# Patient Record
Sex: Female | Born: 1950
Health system: Southern US, Community
[De-identification: ages and names within clinical notes are randomized; demographics above are authoritative.]

## PROBLEM LIST (undated history)

## (undated) DIAGNOSIS — I10 Essential (primary) hypertension: Secondary | ICD-10-CM

## (undated) DIAGNOSIS — E7800 Pure hypercholesterolemia, unspecified: Secondary | ICD-10-CM

## (undated) HISTORY — PX: ABDOMINAL HYSTERECTOMY: SHX81

## (undated) HISTORY — DX: Essential (primary) hypertension: I10

## (undated) HISTORY — DX: Pure hypercholesterolemia, unspecified: E78.00

---

## 1998-06-27 ENCOUNTER — Other Ambulatory Visit: Admission: RE | Admit: 1998-06-27 | Discharge: 1998-06-27 | Payer: Self-pay | Admitting: Obstetrics and Gynecology

## 1999-02-23 ENCOUNTER — Other Ambulatory Visit: Admission: RE | Admit: 1999-02-23 | Discharge: 1999-02-23 | Payer: Self-pay | Admitting: Obstetrics and Gynecology

## 1999-04-05 ENCOUNTER — Encounter (INDEPENDENT_AMBULATORY_CARE_PROVIDER_SITE_OTHER): Payer: Self-pay

## 1999-04-05 ENCOUNTER — Other Ambulatory Visit: Admission: RE | Admit: 1999-04-05 | Discharge: 1999-04-05 | Payer: Self-pay | Admitting: Obstetrics and Gynecology

## 1999-06-30 ENCOUNTER — Other Ambulatory Visit: Admission: RE | Admit: 1999-06-30 | Discharge: 1999-06-30 | Payer: Self-pay | Admitting: Obstetrics and Gynecology

## 1999-11-15 ENCOUNTER — Other Ambulatory Visit: Admission: RE | Admit: 1999-11-15 | Discharge: 1999-11-15 | Payer: Self-pay | Admitting: Obstetrics and Gynecology

## 1999-11-15 ENCOUNTER — Encounter (INDEPENDENT_AMBULATORY_CARE_PROVIDER_SITE_OTHER): Payer: Self-pay | Admitting: Specialist

## 2004-01-25 ENCOUNTER — Encounter: Admission: RE | Admit: 2004-01-25 | Discharge: 2004-01-25 | Payer: Self-pay | Admitting: Neurosurgery

## 2004-06-09 ENCOUNTER — Other Ambulatory Visit: Admission: RE | Admit: 2004-06-09 | Discharge: 2004-06-09 | Payer: Self-pay | Admitting: Obstetrics and Gynecology

## 2004-07-05 ENCOUNTER — Ambulatory Visit (HOSPITAL_COMMUNITY): Admission: RE | Admit: 2004-07-05 | Discharge: 2004-07-05 | Payer: Self-pay | Admitting: Obstetrics and Gynecology

## 2004-07-12 ENCOUNTER — Encounter: Admission: RE | Admit: 2004-07-12 | Discharge: 2004-07-12 | Payer: Self-pay | Admitting: Specialist

## 2005-07-10 ENCOUNTER — Ambulatory Visit (HOSPITAL_COMMUNITY): Admission: RE | Admit: 2005-07-10 | Discharge: 2005-07-10 | Payer: Self-pay | Admitting: Obstetrics and Gynecology

## 2006-11-20 ENCOUNTER — Emergency Department (HOSPITAL_COMMUNITY): Admission: EM | Admit: 2006-11-20 | Discharge: 2006-11-20 | Payer: Self-pay | Admitting: Family Medicine

## 2007-07-15 ENCOUNTER — Ambulatory Visit (HOSPITAL_COMMUNITY): Admission: RE | Admit: 2007-07-15 | Discharge: 2007-07-15 | Payer: Self-pay | Admitting: Obstetrics and Gynecology

## 2007-08-06 ENCOUNTER — Emergency Department (HOSPITAL_COMMUNITY): Admission: EM | Admit: 2007-08-06 | Discharge: 2007-08-06 | Payer: Self-pay | Admitting: Emergency Medicine

## 2007-08-15 ENCOUNTER — Emergency Department (HOSPITAL_COMMUNITY): Admission: EM | Admit: 2007-08-15 | Discharge: 2007-08-15 | Payer: Self-pay | Admitting: Emergency Medicine

## 2008-07-16 ENCOUNTER — Ambulatory Visit (HOSPITAL_COMMUNITY): Admission: RE | Admit: 2008-07-16 | Discharge: 2008-07-16 | Payer: Self-pay | Admitting: Obstetrics and Gynecology

## 2010-03-12 ENCOUNTER — Encounter: Payer: Self-pay | Admitting: Obstetrics and Gynecology

## 2010-11-16 LAB — URINALYSIS, ROUTINE W REFLEX MICROSCOPIC
Bilirubin Urine: NEGATIVE
Glucose, UA: NEGATIVE
Hgb urine dipstick: NEGATIVE
Ketones, ur: NEGATIVE
Nitrite: NEGATIVE
Protein, ur: NEGATIVE
Specific Gravity, Urine: 1.005
Urobilinogen, UA: 0.2
pH: 6

## 2010-11-16 LAB — BASIC METABOLIC PANEL
BUN: 11
CO2: 31
Calcium: 9.6
Chloride: 102
Creatinine, Ser: 0.88
GFR calc Af Amer: >60
GFR calc non Af Amer: >60
Glucose, Bld: 117 — ABNORMAL HIGH
Potassium: 2.9 — ABNORMAL LOW
Sodium: 141

## 2010-11-16 LAB — URINE MICROSCOPIC-ADD ON

## 2012-04-02 ENCOUNTER — Ambulatory Visit: Payer: Self-pay | Admitting: Gynecology

## 2012-04-09 ENCOUNTER — Encounter: Payer: Self-pay | Admitting: Gynecology

## 2012-04-09 ENCOUNTER — Other Ambulatory Visit (HOSPITAL_COMMUNITY)
Admission: RE | Admit: 2012-04-09 | Discharge: 2012-04-09 | Disposition: A | Discharge status: Home / Self Care | Payer: PRIVATE HEALTH INSURANCE | Source: Ambulatory Visit | Attending: Gynecology | Admitting: Gynecology

## 2012-04-09 ENCOUNTER — Ambulatory Visit (INDEPENDENT_AMBULATORY_CARE_PROVIDER_SITE_OTHER): Payer: PRIVATE HEALTH INSURANCE | Admitting: Gynecology

## 2012-04-09 VITALS — BP 140/92 | Ht 63.25 in | Wt 198.0 lb

## 2012-04-09 DIAGNOSIS — Z01419 Encounter for gynecological examination (general) (routine) without abnormal findings: Secondary | ICD-10-CM

## 2012-04-09 DIAGNOSIS — Z1151 Encounter for screening for human papillomavirus (HPV): Secondary | ICD-10-CM | POA: Insufficient documentation

## 2012-04-09 DIAGNOSIS — Z780 Asymptomatic menopausal state: Secondary | ICD-10-CM

## 2012-04-09 NOTE — Patient Instructions (Addendum)
Colonoscopy A colonoscopy is an exam to evaluate your entire colon. In this exam, your colon is cleansed. A long fiberoptic tube is inserted through your rectum and into your colon. The fiberoptic scope (endoscope) is a long bundle of enclosed and very flexible fibers. These fibers transmit light to the area examined and send images from that area to your caregiver. Discomfort is usually minimal. You may be given a drug to help you sleep (sedative) during or prior to the procedure. This exam helps to detect lumps (tumors), polyps, inflammation, and areas of bleeding. Your caregiver may also take a small piece of tissue (biopsy) that will be examined under a microscope. LET YOUR CAREGIVER KNOW ABOUT:   Allergies to food or medicine.  Medicines taken, including vitamins, herbs, eyedrops, over-the-counter medicines, and creams.  Use of steroids (by mouth or creams).  Previous problems with anesthetics or numbing medicines.  History of bleeding problems or blood clots.  Previous surgery.  Other health problems, including diabetes and kidney problems.  Possibility of pregnancy, if this applies. BEFORE THE PROCEDURE   A clear liquid diet may be required for 2 days before the exam.  Ask your caregiver about changing or stopping your regular medications.  Liquid injections (enemas) or laxatives may be required.  A large amount of electrolyte solution may be given to you to drink over a short period of time. This solution is used to clean out your colon.  You should be present 60 minutes prior to your procedure or as directed by your caregiver. AFTER THE PROCEDURE   If you received a sedative or pain relieving medication, you will need to arrange for someone to drive you home.  Occasionally, there is a little blood passed with the first bowel movement. Do not be concerned. FINDING OUT THE RESULTS OF YOUR TEST Not all test results are available during your visit. If your test results are  not back during the visit, make an appointment with your caregiver to find out the results. Do not assume everything is normal if you have not heard from your caregiver or the medical facility. It is important for you to follow up on all of your test results. HOME CARE INSTRUCTIONS   It is not unusual to pass moderate amounts of gas and experience mild abdominal cramping following the procedure. This is due to air being used to inflate your colon during the exam. Walking or a warm pack on your belly (abdomen) may help.  You may resume all normal meals and activities after sedatives and medicines have worn off.  Only take over-the-counter or prescription medicines for pain, discomfort, or fever as directed by your caregiver. Do not use aspirin or blood thinners if a biopsy was taken. Consult your caregiver for medicine usage if biopsies were taken. SEEK IMMEDIATE MEDICAL CARE IF:   You have a fever.  You pass large blood clots or fill a toilet with blood following the procedure. This may also occur 10 to 14 days following the procedure. This is more likely if a biopsy was taken.  You develop abdominal pain that keeps getting worse and cannot be relieved with medicine. Document Released: 02/03/2000 Document Revised: 04/30/2011 Document Reviewed: 09/18/2007 [employer] Patient Information 2013 [employer].  

## 2012-04-09 NOTE — Addendum Note (Signed)
Addended by: Bertram Savin A on: 04/09/2012 03:30 PM   Modules accepted: Orders

## 2012-04-09 NOTE — Progress Notes (Signed)
Meghan Nichols 1950-03-16 [id number]   History:    62 y.o.  for annual gyn exam who is a new patient to the practice. She moved [place] and stated that her last gynecological examination was 3 years ago. She stated that in her early 20s she had an abdominal hysterectomy with bilateral salpingo-oophorectomy for symptomatic leiomyomatous uteri. She was on hormone replacement therapy for less than 6 months and she was afraid of breast cancer and since then has not been on any hormone replacement therapy and has no vasomotor symptoms reported. Patient did state that she's always had normal Pap smears in the past. Her last mammogram she stated was in December 2013 was normal. She also indicates that she does her monthly self breast examination. She has not had a colonoscopy as of yet. Patient declines any form of vaccination. Patient has had no prior bone density study.  Past medical history,surgical history, family history and social history were all reviewed and documented in the EPIC chart.  Gynecologic History No LMP recorded. Patient has had a hysterectomy. Contraception: status post hysterectomy Last Pap: 3 years ago. Results were: normal Last mammogram: 2013. Results were: normal  Obstetric History OB History   Grav Para Term Preterm Abortions TAB SAB Ect Mult Living   2 2        2      # Outc Date GA Lbr Len/2nd Wgt Sex Del Anes PTL Lv   1 PAR            2 PAR                ROS: A ROS was performed and pertinent positives and negatives are included in the history.  GENERAL: No fevers or chills. HEENT: No change in vision, no earache, sore throat or sinus congestion. NECK: No pain or stiffness. CARDIOVASCULAR: No chest pain or pressure. No palpitations. PULMONARY: No shortness of breath, cough or wheeze. GASTROINTESTINAL: No abdominal pain, nausea, vomiting or diarrhea, melena or bright red blood per rectum. GENITOURINARY: No urinary frequency, urgency, hesitancy or dysuria.  MUSCULOSKELETAL: No joint or muscle pain, no back pain, no recent trauma. DERMATOLOGIC: No rash, no itching, no lesions. ENDOCRINE: No polyuria, polydipsia, no heat or cold intolerance. No recent change in weight. HEMATOLOGICAL: No anemia or easy bruising or bleeding. NEUROLOGIC: No headache, seizures, numbness, tingling or weakness. PSYCHIATRIC: No depression, no loss of interest in normal activity or change in sleep pattern.     Exam: chaperone present  BP 140/92  Ht 5' 3.25" (1.607 m)  Wt 198 lb (89.812 kg)  BMI 34.78 kg/m2  Body mass index is 34.78 kg/(m^2).  General appearance : Well developed well nourished female. No acute distress HEENT: Neck supple, trachea midline, no carotid bruits, no thyroidmegaly Lungs: Clear to auscultation, no rhonchi or wheezes, or rib retractions  Heart: Regular rate and rhythm, no murmurs or gallops Breast:Examined in sitting and supine position were symmetrical in appearance, no palpable masses or tenderness,  no skin retraction, no nipple inversion, no nipple discharge, no skin discoloration, no axillary or supraclavicular lymphadenopathy Abdomen: no palpable masses or tenderness, no rebound or guarding Extremities: no edema or skin discoloration or tenderness  Pelvic:  Bartholin, Urethra, Skene Glands: Within normal limits             Vagina: No gross lesions or discharge  Cervix: absent             Uterus Absent  Adnexa  Without masses or tenderness  Anus  and perineum  normal   Rectovaginal  normal sphincter tone without palpated masses or tenderness             Hemoccult Card provided     Assessment/Plan:  62 y.o. female for annual exam with past history of surgical menopause (TAH BSO in her early 83s) asymptomatic. Patient would know prior colonoscopy. She will be given the names of our colleagues in the community for her to schedule. She was reminded to submit to the office Hemoccult cards for testing. We discussed also importance of monthly  self breast examination. She will also schedule a bone density study here in the office the next few weeks. We discussed importance of calcium and vitamin D for osteoporosis prevention.her lab work will be drawn by her primary physician Dr. Theodoro Grist.    Ok Edwards MD, 3:20 PM 04/09/2012

## 2013-12-21 ENCOUNTER — Encounter: Payer: Self-pay | Admitting: Gynecology

## 2014-06-12 ENCOUNTER — Emergency Department (HOSPITAL_COMMUNITY): Payer: BLUE CROSS/BLUE SHIELD

## 2014-06-12 ENCOUNTER — Emergency Department (HOSPITAL_COMMUNITY)
Admission: EM | Admit: 2014-06-12 | Discharge: 2014-06-12 | Disposition: A | Discharge status: Home / Self Care | Payer: BLUE CROSS/BLUE SHIELD | Attending: Emergency Medicine | Admitting: Emergency Medicine

## 2014-06-12 ENCOUNTER — Encounter (HOSPITAL_COMMUNITY): Payer: Self-pay | Admitting: Emergency Medicine

## 2014-06-12 DIAGNOSIS — E876 Hypokalemia: Secondary | ICD-10-CM

## 2014-06-12 DIAGNOSIS — Z79899 Other long term (current) drug therapy: Secondary | ICD-10-CM | POA: Insufficient documentation

## 2014-06-12 DIAGNOSIS — Z7982 Long term (current) use of aspirin: Secondary | ICD-10-CM | POA: Diagnosis not present

## 2014-06-12 DIAGNOSIS — E780 Pure hypercholesterolemia: Secondary | ICD-10-CM | POA: Diagnosis not present

## 2014-06-12 DIAGNOSIS — K921 Melena: Secondary | ICD-10-CM | POA: Diagnosis present

## 2014-06-12 DIAGNOSIS — Z880 Allergy status to penicillin: Secondary | ICD-10-CM | POA: Insufficient documentation

## 2014-06-12 DIAGNOSIS — I10 Essential (primary) hypertension: Secondary | ICD-10-CM | POA: Diagnosis not present

## 2014-06-12 DIAGNOSIS — K529 Noninfective gastroenteritis and colitis, unspecified: Secondary | ICD-10-CM

## 2014-06-12 LAB — COMPREHENSIVE METABOLIC PANEL
ALT: 27 U/L (ref 0–35)
AST: 30 U/L (ref 0–37)
Albumin: 3.8 g/dL (ref 3.5–5.2)
Alkaline Phosphatase: 62 U/L (ref 39–117)
Anion gap: 11 (ref 5–15)
BUN: 18 mg/dL (ref 6–23)
CO2: 26 mmol/L (ref 19–32)
Calcium: 9.4 mg/dL (ref 8.4–10.5)
Chloride: 100 mmol/L (ref 96–112)
Creatinine, Ser: 1.06 mg/dL (ref 0.50–1.10)
GFR calc Af Amer: 64 mL/min — ABNORMAL LOW (ref >90)
GFR calc non Af Amer: 55 mL/min — ABNORMAL LOW (ref >90)
Glucose, Bld: 134 mg/dL — ABNORMAL HIGH (ref 70–99)
Potassium: 2.7 mmol/L — CL (ref 3.5–5.1)
Sodium: 137 mmol/L (ref 135–145)
Total Bilirubin: 0.3 mg/dL (ref 0.3–1.2)
Total Protein: 7.6 g/dL (ref 6.0–8.3)

## 2014-06-12 LAB — CBC WITH DIFFERENTIAL/PLATELET
Basophils Absolute: 0 10*3/uL (ref 0.0–0.1)
Basophils Relative: 0 % (ref 0–1)
Eosinophils Absolute: 0 10*3/uL (ref 0.0–0.7)
Eosinophils Relative: 0 % (ref 0–5)
HCT: 39.7 % (ref 36.0–46.0)
Hemoglobin: 13 g/dL (ref 12.0–15.0)
Lymphocytes Relative: 11 % — ABNORMAL LOW (ref 12–46)
Lymphs Abs: 1.4 10*3/uL (ref 0.7–4.0)
MCH: 26.1 pg (ref 26.0–34.0)
MCHC: 32.7 g/dL (ref 30.0–36.0)
MCV: 79.6 fL (ref 78.0–100.0)
Monocytes Absolute: 0.3 10*3/uL (ref 0.1–1.0)
Monocytes Relative: 2 % — ABNORMAL LOW (ref 3–12)
Neutro Abs: 10.2 10*3/uL — ABNORMAL HIGH (ref 1.7–7.7)
Neutrophils Relative %: 87 % — ABNORMAL HIGH (ref 43–77)
Platelets: 359 10*3/uL (ref 150–400)
RBC: 4.99 MIL/uL (ref 3.87–5.11)
RDW: 16 % — ABNORMAL HIGH (ref 11.5–15.5)
WBC: 11.8 10*3/uL — ABNORMAL HIGH (ref 4.0–10.5)

## 2014-06-12 LAB — POC OCCULT BLOOD, ED: Fecal Occult Bld: POSITIVE — AB

## 2014-06-12 LAB — LIPASE, BLOOD: Lipase: 39 U/L (ref 11–59)

## 2014-06-12 LAB — I-STAT CG4 LACTIC ACID, ED
Lactic Acid, Venous: 1.52 mmol/L (ref 0.5–2.0)
Lactic Acid, Venous: 1.69 mmol/L (ref 0.5–2.0)

## 2014-06-12 MED ORDER — POTASSIUM CHLORIDE 10 MEQ/100ML IV SOLN
10.0000 meq | Freq: Once | INTRAVENOUS | Status: AC
  Administered 2014-06-12: 10 meq via INTRAVENOUS
  Filled 2014-06-12: qty 100

## 2014-06-12 MED ORDER — HYDROCODONE-ACETAMINOPHEN 5-325 MG PO TABS: 1.0000 | ORAL_TABLET | Freq: Four times a day (QID) | ORAL | Status: DC | PRN

## 2014-06-12 MED ORDER — CIPROFLOXACIN HCL 500 MG PO TABS: 500.0000 mg | ORAL_TABLET | Freq: Two times a day (BID) | ORAL | Status: DC

## 2014-06-12 MED ORDER — METRONIDAZOLE 500 MG PO TABS
500.0000 mg | ORAL_TABLET | Freq: Once | ORAL | Status: AC
  Administered 2014-06-12: 500 mg via ORAL
  Filled 2014-06-12: qty 1

## 2014-06-12 MED ORDER — IOHEXOL 300 MG/ML  SOLN
100.0000 mL | Freq: Once | INTRAMUSCULAR | Status: AC | PRN
  Administered 2014-06-12: 100 mL via INTRAVENOUS

## 2014-06-12 MED ORDER — CIPROFLOXACIN HCL 500 MG PO TABS
500.0000 mg | ORAL_TABLET | Freq: Once | ORAL | Status: AC
  Administered 2014-06-12: 500 mg via ORAL
  Filled 2014-06-12: qty 1

## 2014-06-12 MED ORDER — METRONIDAZOLE 500 MG PO TABS: 500.0000 mg | ORAL_TABLET | Freq: Three times a day (TID) | ORAL | Status: DC

## 2014-06-12 MED ORDER — POTASSIUM CHLORIDE 20 MEQ/15ML (10%) PO SOLN
40.0000 meq | Freq: Once | ORAL | Status: AC
  Administered 2014-06-12: 40 meq via ORAL
  Filled 2014-06-12: qty 30

## 2014-06-12 MED ORDER — IOHEXOL 300 MG/ML  SOLN
25.0000 mL | Freq: Once | INTRAMUSCULAR | Status: AC | PRN
  Administered 2014-06-12: 25 mL via ORAL

## 2014-06-12 NOTE — Discharge Instructions (Signed)
cipro and flagyl until all gone for infection. Take norco for pain as prescribed. Take a stool softner, because it can make you constipated. Follow up with your gastroenterologist and your primary care doctor for recheck of your potassium and fur Colitis Colitis is inflammation of the colon. Colitis can be a short-term or long-standing (chronic) illness. Crohn's disease and ulcerative colitis are 2 types of colitis which are chronic. They usually require lifelong treatment. CAUSES  There are many different causes of colitis, including:  Viruses.  Germs (bacteria).  Medicine reactions. SYMPTOMS   Diarrhea.  Intestinal bleeding.  Pain.  Fever.  Throwing up (vomiting).  Tiredness (fatigue).  Weight loss.  Bowel blockage. DIAGNOSIS  The diagnosis of colitis is based on examination and stool or blood tests. X-rays, CT scan, and colonoscopy may also be needed. TREATMENT  Treatment may include:  Fluids given through the vein (intravenously).  Bowel rest (nothing to eat or drink for a period of time).  Medicine for pain and diarrhea.  Medicines (antibiotics) that kill germs.  Cortisone medicines.  Surgery. HOME CARE INSTRUCTIONS   Get plenty of rest.  Drink enough water and fluids to keep your urine clear or pale yellow.  Eat a well-balanced diet.  Call your caregiver for follow-up as recommended. SEEK IMMEDIATE MEDICAL CARE IF:   You develop chills.  You have an oral temperature above 102 F (38.9 C), not controlled by medicine.  You have extreme weakness, fainting, or dehydration.  You have repeated vomiting.  You develop severe belly (abdominal) pain or are passing bloody or tarry stools. MAKE SURE YOU:   Understand these instructions.  Will watch your condition.  Will get help right away if you are not doing well or get worse. Document Released: 03/15/2004 Document Revised: 04/30/2011 Document Reviewed: 06/10/2009 [facility] Patient Information  2015 [place], [place]. This information is not intended to replace advice given to you by your health care provider. Make sure you discuss any questions you have with your health care provider. ther evaluation of colitis.

## 2014-06-12 NOTE — ED Notes (Signed)
CT notified patient completed oral contrast

## 2014-06-12 NOTE — ED Notes (Signed)
Patient coming from home with c/o of 2 episodes of blood in stool that started today.  Associated RUQ abdominal pain that started today, pain 7/10.

## 2014-06-12 NOTE — ED Provider Notes (Signed)
CSN: [id number]     Arrival date & time 06/12/14  0711 History   First MD Initiated Contact with Patient 06/12/14 [phone]     Chief Complaint  Patient presents with  . Blood In Stools     (Consider location/radiation/quality/duration/timing/severity/associated sxs/prior Treatment) HPI Meghan Nichols is a 64 y.o. female with hx of htn, presents to ED with complaint of abdominal pain and blood in her stool. Patient states she woke up this morning with abdominal cramping. States she went to the bathroom had a bowel movement, she states that she did not turn the lights on and flushed the toilet, but denies any diarrhea. States stool seemed to be solid. She states that she went back to bed and then had to get up again because of the urge to have a bowel movement again. When she went to the bathroom, she states she sat on the toilet" for some time and only bright red blood came out." She continues to have diffuse abdominal pain worse in the right lower quadrant. She denies any history of the same. No prior abdominal surgeries. Denies any rectal pain. No fever, chills, malaise. No nausea or vomiting.   Past Medical History  Diagnosis Date  . High cholesterol   . Hypertension    Past Surgical History  Procedure Laterality Date  . Abdominal hysterectomy     Family History  Problem Relation Age of Onset  . Hypertension Mother   . Hypertension Father   . Hypertension Sister   . Diabetes Sister   . Hypertension Maternal Grandmother   . Hypertension Maternal Grandfather   . Diabetes Sister    History  Substance Use Topics  . Smoking status: Never Smoker   . Smokeless tobacco: Never Used  . Alcohol Use: No   OB History    Gravida Para Term Preterm AB TAB SAB Ectopic Multiple Living   2 2        2      Review of Systems  Constitutional: Negative for fever and chills.  Respiratory: Negative for cough, chest tightness and shortness of breath.   Cardiovascular: Negative for chest pain,  palpitations and leg swelling.  Gastrointestinal: Positive for abdominal pain and blood in stool. Negative for nausea, vomiting and diarrhea.  Genitourinary: Negative for dysuria and flank pain.  Musculoskeletal: Negative for myalgias, arthralgias, neck pain and neck stiffness.  Skin: Negative for rash.  Neurological: Negative for dizziness, weakness and headaches.  All other systems reviewed and are negative.     Allergies  Penicillins  Home Medications   Prior to Admission medications   Medication Sig Start Date End Date Taking? Authorizing Provider  Ascorbic Acid (VITAMIN C) 100 MG tablet Take 100 mg by mouth daily.    Historical Provider, MD  aspirin 81 MG tablet Take 81 mg by mouth daily.    Historical Provider, MD  cholecalciferol (VITAMIN D) 1000 UNITS tablet Take 1,000 Units by mouth daily.    Historical Provider, MD  lisinopril (PRINIVIL,ZESTRIL) 40 MG tablet Take 40 mg by mouth daily.    Historical Provider, MD  metoprolol succinate (TOPROL-XL) 100 MG 24 hr tablet Take 100 mg by mouth daily. Take with or immediately following a meal.    Historical Provider, MD  pravastatin (PRAVACHOL) 20 MG tablet Take 20 mg by mouth daily.    Historical Provider, MD  triamterene-hydrochlorothiazide (DYAZIDE) 37.5-25 MG per capsule Take 1 capsule by mouth every morning.    Historical Provider, MD   BP 146/79 mmHg  Pulse  107  Temp(Src) 98.2 F (36.8 C) (Oral)  Resp 24  SpO2 98% Physical Exam  Constitutional: She appears well-developed and well-nourished. No distress.  HENT:  Head: Normocephalic.  Eyes: Conjunctivae are normal.  Neck: Neck supple.  Cardiovascular: Normal rate, regular rhythm and normal heart sounds.   Pulmonary/Chest: Effort normal and breath sounds normal. No respiratory distress. She has no wheezes. She has no rales.  Abdominal: Soft. Bowel sounds are normal. She exhibits no distension. There is tenderness. There is no rebound.  Diffuse tenderness to palpation.    Genitourinary:  Small external non thrombosed hemorrhoid with no bleeding. Rectal exam unremarkable with no tenderness. Bright red blood noted on the finger  Musculoskeletal: She exhibits no edema.  Neurological: She is alert.  Skin: Skin is warm and dry.  Psychiatric: She has a normal mood and affect. Her behavior is normal.  Nursing note and vitals reviewed.   ED Course  Procedures (including critical care time) Labs Review Labs Reviewed  CBC WITH DIFFERENTIAL/PLATELET - Abnormal; Notable for the following:    WBC 11.8 (*)    RDW 16.0 (*)    Neutrophils Relative % 87 (*)    Neutro Abs 10.2 (*)    Lymphocytes Relative 11 (*)    Monocytes Relative 2 (*)    All other components within normal limits  COMPREHENSIVE METABOLIC PANEL - Abnormal; Notable for the following:    Potassium 2.7 (*)    Glucose, Bld 134 (*)    GFR calc non Af Amer 55 (*)    GFR calc Af Amer 64 (*)    All other components within normal limits  POC OCCULT BLOOD, ED - Abnormal; Notable for the following:    Fecal Occult Bld POSITIVE (*)    All other components within normal limits  LIPASE, BLOOD  I-STAT CG4 LACTIC ACID, ED  I-STAT CG4 LACTIC ACID, ED    Imaging Review Ct Abdomen Pelvis W Contrast  06/12/2014   CLINICAL DATA:  Bloody stool  EXAM: CT ABDOMEN AND PELVIS WITH CONTRAST  TECHNIQUE: Multidetector CT imaging of the abdomen and pelvis was performed using the standard protocol following bolus administration of intravenous contrast.  CONTRAST:  OMNIPAQUE IOHEXOL 300 MG/ML  SOLN  COMPARISON:  None.  FINDINGS: Renal delayed imaging is excluded due to technical difficulties in the exam.  Moderate wall thickening of the descending and sigmoid colon associated with stranding in the adjacent fat is present. No pneumatosis, extraluminal bowel gas, or abscess formation. Diverticulosis is associated.  Nonspecific 6 mm hypodensity in the medial segment of the left lobe of the liver on image 20.  Gallbladder,  spleen, pancreas, right adrenal gland, and kidneys are within normal limits  2.2 x 1.6 cm left adrenal mass. It is nonspecific by CT imaging features. There is a smaller left adrenal nodule on image 29.  Little if any atherosclerotic changes in the vasculature. SMA and IMA are grossly patent.  Broad-based disc herniation at L5-S1 with vacuum. Left paracentral and foraminal disc herniation at L4-5. Less prominent left foraminal and extra foraminal disc herniation at L3-4.  IMPRESSION: There is long segment wall thickening and inflammatory change involving the descending and sigmoid colon with diverticulosis. Differential diagnosis includes inflammatory bowel disease, infectious colitis, ischemia, and less likely malignancy.  Nonspecific left adrenal nodule. If there is a history of malignancy or high risk for malignancy, MRI is recommended.  Nonspecific hypodensity in the liver. If there is a history of malignancy or high risk for malignancy,  six-month followup MRI is recommended.   Electronically Signed   By: Jolaine Click M.D.   On: 06/12/2014 11:03     EKG Interpretation None      MDM   Final diagnoses:  Colitis  Hypokalemia    Patient with diffuse abdominal pain and blood per rectum onset this morning. Vital signs are normal, she is afebrile. She is nontoxic appearing. Abdomen is soft, no peritoneal signs. Will get a labs and CT abdomen and pelvis to rule out colitis. Patient does not have history of the same.  Patient's potassium is 2.7, 40 mEq by mouth and 10 medical length IV ordered. Patient has history of the same. She is supposed to be taking potassium at home. She states she has not been taking it regularly because last time she was told her potassium was too high. Will make sure she takes it at home.  12:10 PM Patient's CT showed colitis. It is unclear the etiology of this colitis, doubt it is ischemic with normal lactic acid. She has no history of IBD. will follow up with patient's  gastroenterologist. She states she has one but she does not remember the name. She will call him in 2 days on Monday. We'll start her on Cipro and Flagyl. Norco for pain. Follow up as soon as able. Also instructed to have her follow up with primary care doctor for recheck of potassium. Patient is otherwise nontoxic appearing. Stable for outpatient treatment at this time.   Filed Vitals:   06/12/14 0830 06/12/14 0900 06/12/14 0915 06/12/14 0945  BP: 129/79 155/89 158/90 141/75  Pulse: 58 64 57 64  Temp:      TempSrc:      Resp: SpO2: 96% 94% 95% 95%        Jaynie Crumble, PA-C 06/12/14 1211  Blane Ohara, MD 06/13/14 [phone]

## 2015-08-25 ENCOUNTER — Encounter: Payer: Self-pay | Admitting: Family Medicine

## 2015-08-25 ENCOUNTER — Ambulatory Visit (INDEPENDENT_AMBULATORY_CARE_PROVIDER_SITE_OTHER): Payer: Medicare Other | Admitting: Family Medicine

## 2015-08-25 VITALS — BP 150/100 | HR 84 | Temp 97.9°F | Resp 12 | Ht 63.25 in | Wt 197.0 lb

## 2015-08-25 DIAGNOSIS — I10 Essential (primary) hypertension: Secondary | ICD-10-CM

## 2015-08-25 DIAGNOSIS — R739 Hyperglycemia, unspecified: Secondary | ICD-10-CM

## 2015-08-25 DIAGNOSIS — E7800 Pure hypercholesterolemia, unspecified: Secondary | ICD-10-CM

## 2015-08-25 DIAGNOSIS — J309 Allergic rhinitis, unspecified: Secondary | ICD-10-CM | POA: Insufficient documentation

## 2015-08-25 DIAGNOSIS — E782 Mixed hyperlipidemia: Secondary | ICD-10-CM | POA: Insufficient documentation

## 2015-08-25 LAB — LIPID PANEL
Cholesterol: 239 mg/dL — ABNORMAL HIGH (ref 0–200)
HDL: 47 mg/dL (ref >39.00)
NonHDL: 192.27
Total CHOL/HDL Ratio: 5
Triglycerides: 243 mg/dL — ABNORMAL HIGH (ref 0.0–149.0)
VLDL: 48.6 mg/dL — ABNORMAL HIGH (ref 0.0–40.0)

## 2015-08-25 LAB — COMPREHENSIVE METABOLIC PANEL
ALT: 12 U/L (ref 0–35)
AST: 17 U/L (ref 0–37)
Albumin: 4.3 g/dL (ref 3.5–5.2)
Alkaline Phosphatase: 66 U/L (ref 39–117)
BUN: 12 mg/dL (ref 6–23)
CO2: 31 mEq/L (ref 19–32)
Calcium: 10 mg/dL (ref 8.4–10.5)
Chloride: 101 mEq/L (ref 96–112)
Creatinine, Ser: 0.92 mg/dL (ref 0.40–1.20)
GFR: 79.25 mL/min (ref >60.00)
Glucose, Bld: 97 mg/dL (ref 70–99)
Potassium: 3.1 mEq/L — ABNORMAL LOW (ref 3.5–5.1)
Sodium: 141 mEq/L (ref 135–145)
Total Bilirubin: 0.2 mg/dL (ref 0.2–1.2)
Total Protein: 8 g/dL (ref 6.0–8.3)

## 2015-08-25 LAB — LDL CHOLESTEROL, DIRECT: Direct LDL: 160 mg/dL

## 2015-08-25 LAB — HEMOGLOBIN A1C: Hgb A1c MFr Bld: 6.2 % (ref 4.6–6.5)

## 2015-08-25 MED ORDER — TRIAMTERENE-HCTZ 37.5-25 MG PO CAPS: 1.0000 | ORAL_CAPSULE | ORAL | Status: DC

## 2015-08-25 MED ORDER — PRAVASTATIN SODIUM 20 MG PO TABS: 20.0000 mg | ORAL_TABLET | Freq: Every day | ORAL | Status: DC

## 2015-08-25 MED ORDER — METOPROLOL SUCCINATE ER 100 MG PO TB24: 100.0000 mg | ORAL_TABLET | Freq: Every day | ORAL | Status: DC

## 2015-08-25 MED ORDER — LISINOPRIL 40 MG PO TABS: 40.0000 mg | ORAL_TABLET | Freq: Every day | ORAL | Status: DC

## 2015-08-25 NOTE — Patient Instructions (Addendum)
A few things to remember from today's visit:   Essential hypertension, benign  Hypercholesteremia  Hyperglycemia    We have ordered labs or studies at this visit.  It can take up to 1-2 weeks for results and processing. IF results require follow up or explanation, we will call you with instructions. Clinically stable results will be released to your [facility]ART. If you have not heard from us or cannot find your results in [facility] in 2 weeks please contact our office at [phone].  If you are not yet signed up for [facility]CHART, please consider signing up   Medicare covers a annual preventive visit, which is strongly recommended , it is once per year and involves a series of questions to identify risk factors; so we can try to prevent possible complications. This does not need to be done by a doctor.  We have a nurse Banker(RN) here that is highly qualified to do it, it can be arrange same date you have a follow up appointment with me or labs scheduled, and it 100% covered by Medicare. So before you leave today I would like for you to arrange visit with Ms Montine CircleSusan Hauck for Medicare wellness visit.  Please be sure medication list is accurate. If a new problem present, please set up appointment sooner than planned today.   Continue monitoring blood pressure at home.  Allegra 180 mg daily.

## 2015-08-25 NOTE — Progress Notes (Addendum)
HPI:   [name] is a 65 y.o. female, who is here today to establish care with me.  Former PCP: Dr Meghan Nichols. Last preventive routine visit: 2 years ago.    She lives with husband.  Independent ADL's except for need of assistance for transfer, cane. Independent IADL's. + falls in the past year, 02/2015 in bathtub, denies depression symptoms.  Concerns today: medication refills.  Hypertension:   Dx 1989. Her blood pressure is elevated today, she attributes it to lower back pain and allergies.  Currently she is on Triamcinolone-HCTZ Lisinopril, and Metoprolol Succinate. She is taking medications as instructed, no side effects reported.  She has not noted unusual headache (frontal pressure attributed to allergic rhinitis) , visual changes, exertional chest pain, dyspnea,  focal weakness, or edema.    Lab Results  Component Value Date   CREATININE 1.06 06/12/2014   BUN 18 06/12/2014   NA 137 06/12/2014   K 2.7* 06/12/2014   CL 100 06/12/2014   CO2 26 06/12/2014    Home readings 120-130's/70-80's.  She exercises 2 times per week, including pool exercises. Healthy diet in general.  Last lab work, about a year ago, glucose 134. No known Hx of DM II.   Hyperlipidemia: She is on Pravastatin 20 mg daily.  Following a low fat diet. She has not noted side effects with medication.  She does not remember last lipid panel.  Hx of allergies or rhinitis, she uses Flonase nasal spray as needed. + Frontal pressure like headache. + rhinorrhea and nasal congestion. No sick contact.  History of chronic back pain.    Review of Systems  Constitutional: Negative for fever, activity change, appetite change, fatigue and unexpected weight change.  HENT: Positive for congestion, rhinorrhea and sneezing. Negative for mouth sores, nosebleeds and trouble swallowing.   Eyes: Negative for redness and visual disturbance.  Respiratory: Negative for cough, shortness of  breath and wheezing.   Cardiovascular: Negative for chest pain, palpitations and leg swelling.  Gastrointestinal: Negative for nausea, vomiting, abdominal pain and blood in stool.       Negative for changes in bowel habits.  Genitourinary: Negative for dysuria, hematuria, decreased urine volume and difficulty urinating.  Musculoskeletal: Positive for back pain.  Skin: Negative for color change and rash.  Neurological: Positive for headaches (frontal pressure). Negative for seizures, syncope, weakness and numbness.  Psychiatric/Behavioral: Negative for confusion. The patient is not nervous/anxious.       Current Outpatient Prescriptions on File Prior to Visit  Medication Sig Dispense Refill  . Ascorbic Acid (VITAMIN C) 100 MG tablet Take 100 mg by mouth daily.    Marland Kitchen. aspirin 81 MG tablet Take 81 mg by mouth daily.    . cholecalciferol (VITAMIN D) 1000 UNITS tablet Take 1,000 Units by mouth daily.     No current facility-administered medications on file prior to visit.     Past Medical History  Diagnosis Date  . High cholesterol   . Hypertension    Allergies  Allergen Reactions  . Penicillins Hives    Family History  Problem Relation Age of Onset  . Hypertension Mother   . Hypertension Father   . Hypertension Sister   . Diabetes Sister   . Hypertension Maternal Grandmother   . Hypertension Maternal Grandfather   . Diabetes Sister     Social History   Social History  . Marital Status: Married    Spouse Name: N/A  . Number of Children: N/A  .  Years of Education: N/A   Social History Main Topics  . Smoking status: Never Smoker   . Smokeless tobacco: Never Used  . Alcohol Use: No  . Drug Use: No  . Sexual Activity: No   Other Topics Concern  . None   Social History Narrative    Filed Vitals:   08/25/15 1334  BP: 150/100  Pulse: 84  Temp: 97.9 F (36.6 C)  Resp: 12    Body mass index is 34.6 kg/(m^2).   SpO2 Readings from Last 3 Encounters:    08/25/15 99%  06/12/14 94%      Physical Exam  Nursing note and vitals reviewed. Constitutional: She is oriented to person, place, and time. She appears well-developed. No distress.  HENT:  Head: Atraumatic.  Nose: Rhinorrhea present. Right sinus exhibits no maxillary sinus tenderness and no frontal sinus tenderness. Left sinus exhibits no maxillary sinus tenderness and no frontal sinus tenderness.  Mouth/Throat: Oropharynx is clear and moist and mucous membranes are normal.  Eyes: Conjunctivae and EOM are normal. Pupils are equal, round, and reactive to light.  Neck: No tracheal deviation present. No thyromegaly present.  Cardiovascular: Normal rate and regular rhythm.   No murmur heard. Pulses:      Dorsalis pedis pulses are 2+ on the right side, and 2+ on the left side.  Respiratory: Effort normal and breath sounds normal. No respiratory distress.  GI: Soft. She exhibits no mass. There is no tenderness.  Musculoskeletal: She exhibits no edema.  Lymphadenopathy:    She has no cervical adenopathy.  Neurological: She is alert and oriented to person, place, and time. She has normal strength. Coordination normal.  Stable gait assisted with cane.  Skin: Skin is warm. No erythema.  Psychiatric: She has a normal mood and affect.  Well groomed, good eye contact.      ASSESSMENT AND PLAN:     Meghan Nichols was seen today for new patient (initial visit).  Diagnoses and all orders for this visit:  Essential hypertension, benign  BP recheck and blood pressure reading is better but still elevated. She is reporting normal blood pressure readings at home, according to patient her blood pressure monitor is accurate. No changes in current management. Low sodium diet. Follow-up in 4 months.  -     Comprehensive metabolic panel -     metoprolol succinate (TOPROL-XL) 100 MG 24 hr tablet; Take 1 tablet (100 mg total) by mouth daily. Take with or immediately following a meal. -      triamterene-hydrochlorothiazide (DYAZIDE) 37.5-25 MG capsule; Take 1 each (1 capsule total) by mouth every morning. -     lisinopril (PRINIVIL,ZESTRIL) 40 MG tablet; Take 1 tablet (40 mg total) by mouth daily.  Hypercholesteremia  No changes in current management, will follow labs done today and will give further recommendations accordingly. Low fat diet to continue. Follow-up in 6-12 months.   -     Lipid panel -     pravastatin (PRAVACHOL) 20 MG tablet; Take 1 tablet (20 mg total) by mouth daily.  Hyperglycemia  Further recommendations would be given according to lab results. Healthy diet and regular physical activity consistently for primary prevention.   -     Hemoglobin A1c  Allergic rhinitis, unspecified allergic rhinitis type  Recommended over-the-counter Allegra 180 mg, avoid cold medications or decongestants due to HTN. She can continue Flonase nasal spray, as needed, some side effects discussed. Follow-up as needed.        -Reporting colonoscopy done last  year, 4 polyps found. -S/P hysterectomy. -Fall prevention discussed. -Appointment with Ms. Darl PikesSusan will be arrange for Medicare routine preventive visit.      Quiara Killian G. SwazilandJordan, MD  [facility]. [place] office.

## 2015-08-25 NOTE — Progress Notes (Signed)
Pre visit review using our clinic review tool, if applicable. No additional management support is needed unless otherwise documented below in the visit note. 

## 2015-08-28 ENCOUNTER — Other Ambulatory Visit: Payer: Self-pay | Admitting: Family Medicine

## 2015-08-28 DIAGNOSIS — E876 Hypokalemia: Secondary | ICD-10-CM

## 2015-08-29 ENCOUNTER — Other Ambulatory Visit: Payer: Self-pay | Admitting: Family Medicine

## 2015-08-29 MED ORDER — SIMVASTATIN 20 MG PO TABS: 20.0000 mg | ORAL_TABLET | Freq: Every day | ORAL | Status: DC

## 2015-09-19 ENCOUNTER — Other Ambulatory Visit (INDEPENDENT_AMBULATORY_CARE_PROVIDER_SITE_OTHER): Payer: Medicare Other

## 2015-09-19 DIAGNOSIS — E876 Hypokalemia: Secondary | ICD-10-CM | POA: Diagnosis not present

## 2015-09-19 LAB — POTASSIUM: Potassium: 3.6 mEq/L (ref 3.5–5.1)

## 2015-10-26 DIAGNOSIS — R1031 Right lower quadrant pain: Secondary | ICD-10-CM | POA: Diagnosis not present

## 2015-10-26 DIAGNOSIS — K625 Hemorrhage of anus and rectum: Secondary | ICD-10-CM | POA: Diagnosis not present

## 2015-10-26 DIAGNOSIS — K921 Melena: Secondary | ICD-10-CM | POA: Diagnosis not present

## 2015-11-09 DIAGNOSIS — K625 Hemorrhage of anus and rectum: Secondary | ICD-10-CM | POA: Diagnosis not present

## 2015-11-09 DIAGNOSIS — R1031 Right lower quadrant pain: Secondary | ICD-10-CM | POA: Diagnosis not present

## 2015-11-17 NOTE — Progress Notes (Signed)
Subjective:   Meghan Nichols is a 65 y.o. female who presents for an subsequent  Medicare Annual Wellness Visit. Medicare 2011;   HRA assessment completed during this visit with  The Patient was informed that the wellness visit is to identify future health risk and educate and initiate measures that can reduce risk for increased disease through the lifespan.    NO ROS; Medicare Wellness Visit Describes health as good, fair or great? Good at this tie  Risk Associated with PMH (recently established with Dr. Swaziland)  Disease processes  Er recently with colitis started at 65 yo; irritated by beef; had blood; had colonoscopy and found polyps; Treated for pain HTN; medically managed ; 140/100 but 120/70 at home  High chol; agreed to simvastatin 20mg  Diabetes per record? A1c is 6.2  Educated regarding pre diabetes;   LABS (K 3.6)  Cholesterol 239; Trig 243; HDL 47; LDL 160 Low chol diet   Psychosocial (HTN; DM) Support; married;  Biggest support is her Bible class     Primary Prevention Tobacco never smoked  ETOH: no  BMI 34;  Feel good weight is 150lbs Diet Can't get weight down; Eat vegetables; no fried food  Given information on a health diet;  Given information on tracking food for total calories   Exercise Walks 4 days a week; (60 minutes) 9am to 10 am  Exercise x2 days  Swims x 2 days a week; exercises moderate 60 x 6 360 minutes   Dental; all teeth have been pulled and she has dentures Safety Fall hx; yes; fell in bathtub x 6 months ago due to soap.  Given information on Community safety; driving safety, sun protection, firearm safety, smoke detectors as well as the "yellow dot" program for residents in [facility].   Screenings for secondary risk Health Maintenance Due  Topic Date Due  . Hepatitis C Screening  [birth year]  . HIV Screening  06/30/1967  . TETANUS/TDAP  06/30/1971  . COLONOSCOPY  06/30/2002  . MAMMOGRAM  07/17/2010  . ZOSTAVAX   06/29/2012  . PAP SMEAR  04/10/2015  . INFLUENZA VACCINE  09/20/2015   Educated regarding Hep C  HIV to defer to Dr. Swaziland about HIV  Educated  Mammogram/ 06/2008 / will get mammogram  Shingles; may take in [place] as she is under spouse's plan  GYN; had one 2010 or 2011; doctor was in [place] No period since 51' TAH with cervix removed per the patient  Flu shot; Declines never had the flu    Dexa; < 65  Colonoscopy/ 06/2002'; reported completed last year in 2016;  Dr. Jeani Hawking; GI in [place]; repeat in 5 years  Will attempt to get records EKG 05/2014   Other; had hearing screening  Eye exam; last year; Dr. Harlon Flor   Vaccination update: declines  Will take tetanus   Medications reviewed for issues;   Does not feel she needs Triamterene-HCTZ does not need anymore;    Cognitive screen completed; MMSE documented or assessed for failures or issues with the AD8 screen below:   Ad8 score reviewed for issues;  Issues making decisions; no  Less interest in hobbies / activities" no  Repeats questions, stories; family complaining: NO  Trouble using ordinary gadgets; microwave; computer: no  Forgets the month or year: no  Mismanaging finances: no  Missing apt: no but does write them down  Daily problems with thinking of memory NO Ad8 score is 0  MMSE not appropriate unless AD8 score is > 0  Advanced Directive reviewed for completion or educated regarding [place] form;agreed to take form and reviewed content of AD form today  Current Care Team reviewed and updated     Cardiac Risk Factors include: advanced age (>77men, >4 women);diabetes mellitus;dyslipidemia;family history of premature cardiovascular disease;hypertension;obesity (BMI >30kg/m2)     Objective:    Today's Vitals   11/18/15 0915  BP: (!) 140/100  Pulse: 77  SpO2: 97%  Weight: 195 lb 5 oz (88.6 kg)  Height: 5' 5.3" (1.659 m)   Body mass index is 32.2 kg/m. States BP 120/ 70 at home /  will bring reading next office visit   Current Medications (verified) Outpatient Encounter Prescriptions as of 11/18/2015  Medication Sig  . Ascorbic Acid (VITAMIN C) 100 MG tablet Take 100 mg by mouth daily.  Marland Kitchen aspirin 81 MG tablet Take 81 mg by mouth daily.  . cholecalciferol (VITAMIN D) 1000 UNITS tablet Take 1,000 Units by mouth daily.  Marland Kitchen lisinopril (PRINIVIL,ZESTRIL) 40 MG tablet Take 1 tablet (40 mg total) by mouth daily.  . metoprolol succinate (TOPROL-XL) 100 MG 24 hr tablet Take 1 tablet (100 mg total) by mouth daily. Take with or immediately following a meal.  . simvastatin (ZOCOR) 20 MG tablet Take 1 tablet (20 mg total) by mouth daily.  Marland Kitchen triamterene-hydrochlorothiazide (DYAZIDE) 37.5-25 MG capsule Take 1 each (1 capsule total) by mouth every morning.  . pravastatin (PRAVACHOL) 20 MG tablet Take 1 tablet (20 mg total) by mouth daily. (Patient not taking: Reported on 11/18/2015)   No facility-administered encounter medications on file as of 11/18/2015.     Allergies (verified) Penicillins   History: Past Medical History:  Diagnosis Date  . High cholesterol   . Hypertension    Past Surgical History:  Procedure Laterality Date  . ABDOMINAL HYSTERECTOMY     Family History  Problem Relation Age of Onset  . Hypertension Mother   . Hypertension Father   . Hypertension Sister   . Diabetes Sister   . Hypertension Maternal Grandmother   . Hypertension Maternal Grandfather   . Diabetes Sister    Social History   Occupational History  . Not on file.   Social History Main Topics  . Smoking status: Never Smoker  . Smokeless tobacco: Never Used  . Alcohol use No  . Drug use: No  . Sexual activity: No    Tobacco Counseling Counseling given: Yes   Activities of Daily Living In your present state of health, do you have any difficulty performing the following activities: 11/18/2015  Hearing? (No Data)  Vision? N  Difficulty concentrating or making decisions? N    Walking or climbing stairs? N  Dressing or bathing? N  Doing errands, shopping? N  Preparing Food and eating ? N  Using the Toilet? N  In the past six months, have you accidently leaked urine? N  Do you have problems with loss of bowel control? N  Managing your Medications? N  Managing your Finances? N  Housekeeping or managing your Housekeeping? N  Some recent data might be hidden    Immunizations and Health Maintenance  There is no immunization history on file for this patient. Health Maintenance Due  Topic Date Due  . Hepatitis C Screening  Jul 16, 1950  . HIV Screening  06/30/1967  . TETANUS/TDAP  06/30/1971  . COLONOSCOPY  06/30/2002  . MAMMOGRAM  07/17/2010  . ZOSTAVAX  06/29/2012  . PAP SMEAR  04/10/2015  . INFLUENZA VACCINE  09/20/2015   All preventive  screens reviewed    Patient Care Team: Betty G SwazilandJordan, MD as PCP - General (Family Medicine)  Indicate any recent Medical Services you may have received from other than [facility] providers in the past year (date may be approximate).     Assessment:   This is a routine wellness examination for Meghan Nichols. Established and updated Risk reviewed and appropriate referral made or health recommendations as appropriate based on individual needs and choices;   Educated regarding weight loss Educated regarding pre diabetes Educated regarding prediabetes and numbers;  A1c ranges from 5.8 to 6.5 or fasting Blood sugar > 115 -126; (126 is diabetic)   Risk: >45yo; family hx; overweight or obese; African American; Hispanic; Latino; American [place]; [place] American; [place] Islander; history of diabetes when pregnant; or birth to a baby weighing over 9 lbs. Being less physically active than 30 minutes; 3 times a week;   Prevention; Losing a modest 7 to 8 lbs; If over 200 lbs; 10 to 14 lbs;  Choose healthier foods; colorful veggies; fish or lean meats; drinks water Reduce portion size Start exercising; 30 minutes of fast walking x 30  minutes per day/ 60 min for weight loss   in left ear, has had screening and given information regarding hearing aid assistance  Given information regarding AD and copy of [facility] form  Will try to get report for colonoscopy 2016; Dr. Elnoria HowardHung;   Will bring BP reading to the office at next office visit    Hearing/Vision screen  Hearing Screening   125Hz  250Hz  500Hz  1000Hz  2000Hz  3000Hz  4000Hz  6000Hz  8000Hz   Right ear:     100      Left ear:       100      Dietary issues and exercise activities discussed: Current Exercise Habits: Structured exercise class, Type of exercise: strength training/weights;walking;Other - see comments, Time (Minutes): > 60, Frequency (Times/Week): 5, Weekly Exercise (Minutes/Week): 0, Intensity: Moderate  Goals    . Weight (lb) < 180 lb (81.6 kg)          Check out  online nutrition programs as WikiBlast.com.cychosemyplate.gov and LimitLaws.com.cymyfitnesspal.com; fit452me; Look for foods with "whole" wheat; bran; oatmeal etc Shot at the farmer's markets in season for fresher choices  Watch for "hydrogenated" on the label of oils which are trans-fats.  Watch for "high fructose corn syrup" in snacks, yogurt or ketchup  Meats have less marbling; bright colored fruits and vegetables;  Canned; dump out liquid and wash vegetables. Be mindful of what we are eating  Portion control is essential to a health weight! Sit down; take a break and enjoy your meal; take smaller bites; put the fork down between bites;  It takes 20 minutes to get full; so check in with your fullness cues and stop eating when you start to fill full             Depression Screen PHQ 2/9 Scores 11/18/2015  PHQ - 2 Score 0    Fall Risk Fall Risk  11/18/2015  Falls in the past year? No    Cognitive Function: MMSE - Mini Mental State Exam 11/18/2015  Not completed: (No Data)  AD8 score 0   Screening Tests Health Maintenance  Topic Date Due  . Hepatitis C Screening  1950/04/18  . HIV Screening  06/30/1967  .  TETANUS/TDAP  06/30/1971  . COLONOSCOPY  06/30/2002  . MAMMOGRAM  07/17/2010  . ZOSTAVAX  06/29/2012  . PAP SMEAR  04/10/2015  . INFLUENZA VACCINE  09/20/2015  Plan:   Weight loss; will try to track food  Will check BP at home and bring readings in to Dr. Swaziland  Will get a mamm[facility] at [facility] flu shot  May take shingles vaccine in [place] due to it being cheaper (insurance)   Will take tetanus if she gets a wound and will check on cost of other insurance. Will take the Tdap when she she takes her tetanus  Will discuss HIV with Dr. Swaziland if she fup with screen. Put in lab for Hep c at the next blood draw.   Not a candidate for pap; TAH with cervix removed per the patient.  During the course of the visit, Meghan Nichols was educated and counseled about the following appropriate screening and preventive services:   Vaccines to include Pneumoccal, Influenza, Hepatitis B, Td, Zostavax, HCV  Electrocardiogram  Cardiovascular disease screening  Colorectal cancer screening  Bone density screening  Diabetes screening  Glaucoma screening  Mammography/PAP  Nutrition counseling  Smoking cessation counseling  Patient Instructions (the written plan) were given to the patient.    Montine Circle, RN   11/18/2015

## 2015-11-18 ENCOUNTER — Ambulatory Visit (INDEPENDENT_AMBULATORY_CARE_PROVIDER_SITE_OTHER): Payer: Medicare Other

## 2015-11-18 VITALS — BP 140/100 | HR 77 | Ht 65.3 in | Wt 195.3 lb

## 2015-11-18 DIAGNOSIS — Z0000 Encounter for general adult medical examination without abnormal findings: Secondary | ICD-10-CM

## 2015-11-18 DIAGNOSIS — Z7289 Other problems related to lifestyle: Secondary | ICD-10-CM | POA: Diagnosis not present

## 2015-11-18 NOTE — Progress Notes (Signed)
I have reviewed documentation from this visit and I agree with recommendations given. May need to adjust antihypertensive treatment depending of BP readings.  Jlynn Langille G. SwazilandJordan, MD  [facility]. [place] office.

## 2015-11-18 NOTE — Patient Instructions (Addendum)
Meghan Nichols , Thank you for taking time to come for your Medicare Wellness Visit. I appreciate your ongoing commitment to your health goals. Please review the following plan we discussed and let me know if I can assist you in the future.   Call [facility] and schedule mammogram; will have at [place]   Will take Tetanus with pertussis next; will come if you step on nail or splinter or otherwise dirty wound  Declines flu today  Meghan Nichols will call Dr. Benson Norway for colonoscopy report  Personal safety issues reviewed:  1. Consider starting a community watch program per [facility] 2.  Changes batteries is smoke detector and/or carbon monoxide detector  3.  If you have firearms; keep them in a safe place 4.  Wear protection when in the sun; Always wear sunscreen or a hat; It is good to have your doctor check your skin annually or review any new areas of concern 5. Driving safety; Keep in the right lane; stay 3 car lengths behind the car in front of you on the highway; look 3 times prior to pulling out; carry your cell phone everywhere you go!   Learn about the Yellow Dot program:  The program allows first responders at your emergency to have access to who your physician is, as well as your medications and medical conditions.  Citizens requesting the Yellow Dot Packages should contact Master Corporal Nunzio Cobbs at the [facility] [phone] for the first week of the program and beginning the week after Easter citizens should contact their Scientist, physiological.   For your friend -  Community home solutions   [employer]; [phone] [address] [place]; 919-126-5575 Get resource to get information on any and all community programs for Seniors  [place]: 518-317-8509 Community Health Response Program -381-829-9371 Public Health Dept; Need to be a skilled visit but can assist with bathing as well; [phone]  Adult center for Enrichment;  Call Senior Line;  [phone]  Adult day services include Adult Day Care, Adult Day Healthcare, Group Respite, Care [facility], Volunteer In [employer], Education and [place] - can assist with hearing aid x 1  No reviews  [place]  [place] [address]  [phone]   These are the goals we discussed: Goals    . Weight (lb) < 180 lb (81.6 kg)          Check out  online nutrition programs as GumSearch.nl and http://vang.com/; fit78m; Look for foods with "whole" wheat; bran; oatmeal etc Shot at the farmer's markets in season for fresher choices  Watch for "hydrogenated" on the label of oils which are trans-fats.  Watch for "high fructose corn syrup" in snacks, yogurt or ketchup  Meats have less marbling; bright colored fruits and vegetables;  Canned; dump out liquid and wash vegetables. Be mindful of what we are eating  Portion control is essential to a health weight! Sit down; take a break and enjoy your meal; take smaller bites; put the fork down between bites;  It takes 20 minutes to get full; so check in with your fullness cues and stop eating when you start to fill full              This is a list of the screening recommended for you and due dates:  Health Maintenance  Topic Date Due  .  Hepatitis C: One time screening is recommended by Center for  Disease Control  (CDC) for  adults born from [birth year] through 1965.   08/08/1950  . HIV Screening  06/30/1967  . Tetanus Vaccine  06/30/1971  . Colon Cancer Screening  06/30/2002  . Mammogram  07/17/2010  . Shingles Vaccine  06/29/2012  . Pap Smear  04/10/2015  . Flu Shot  09/20/2015      Screening for Type 2 Diabetes Screening is a way to check for type 2 diabetes in people who do not have symptoms of the disease, but who may likely develop diabetes in the future. Diabetes can lead to serious health problems, but finding diabetes early allows for early  treatment. DIABETES RISK FACTORS   Family history of diabetes.  Diseases of the pancreas.  Obesity or being overweight.  Certain racial or ethnic groups:  American [place].  Pacific Islander.  Hispanic.  Asian.  African American.  High blood pressure (hypertension).  History of diabetes while pregnant (gestational diabetes).  Delivering a baby that weighed over 9 pounds.  Being inactive.  High cholesterol or triglycerides.  Age, especially over 42 years of age.  Other diseases or conditions.  Diseases of the pancreas.  Cardiovascular disease.  Disorders of the endocrine system.  Certain medicines, such as those that treat high blood cholesterol levels. WHO IS SCREENED Adults  Adults who have no risk factors and no symptoms should be screened starting at age 53. If the screening tests are normal, they should be repeated every 3 years.  Adults who do not have symptoms, but have 1 or more risk factors, should be screened.  Adults who have 2 or more risk factors may be screened every year.  Adults who have an A1c (3 month average of blood glucose) greater than 5.7% or who had an impaired glucose tolerance (IGT) or impaired fasting glucose (IFG) on a previous test should be screened.  Pregnant women who have risk factors should be screened at their first prenatal visit.  Women who have given birth and had gestational diabetes should be screened 6-12 weeks after the child is born. This screening should be repeated every 1-3 years after the first test. Children or Adolescents  Children and adolescents should be screened for type 2 diabetes if they are overweight and have 2 of the following risk factors:  Having a family history of type 2 diabetes.  Being a member of a high risk race or ethnic group.  Having signs of insulin resistance or conditions associated with insulin resistance.  Having a mother who had gestational diabetes while pregnant with him or  her.  Screening should start at age 52 or at the onset of puberty, whichever comes first. This should be repeated every 2 years. SCREENING In a screening, your caregiver may:  Ask questions about your overall health. This will include questions about the health of close family members, too.  Ask about any diabetes-like symptoms you may have.  Perform a physical exam.  Order some tests that may include:  A fasting plasma glucose test. This measures the level of glucose in your blood. It is done after you have had nothing to eat but water (fasted) for 8 hours.  A random blood glucose test. This test is done without the need to fast.  An oral glucose tolerance test. This is a blood test done in 2 parts. First, a blood sample is taken after you have fasted. Then, another sample is taken after you drink a liquid that contains a lot of sugar.  An A1c  test. This test shows how much glucose has been in your blood over the past 2 to 3 months.   This information is not intended to replace advice given to you by your health care provider. Make sure you discuss any questions you have with your health care provider.   Document Released: 12/02/2008 Document Revised: 02/26/2014 Document Reviewed: 09/13/2010 Elsevier Interactive Patient Education 2016 [place] in the Home  Falls can cause injuries. They can happen to people of all ages. There are many things you can do to make your home safe and to help prevent falls.  WHAT CAN I DO ON THE OUTSIDE OF MY HOME?  Regularly fix the edges of walkways and driveways and fix any cracks.  Remove anything that might make you trip as you walk through a door, such as a raised step or threshold.  Trim any bushes or trees on the path to your home.  Use bright outdoor lighting.  Clear any walking paths of anything that might make someone trip, such as rocks or tools.  Regularly check to see if handrails are loose or broken. Make  sure that both sides of any steps have handrails.  Any raised decks and porches should have guardrails on the edges.  Have any leaves, snow, or ice cleared regularly.  Use sand or salt on walking paths during winter.  Clean up any spills in your garage right away. This includes oil or grease spills. WHAT CAN I DO IN THE BATHROOM?   Use night lights.  Install grab bars by the toilet and in the tub and shower. Do not use towel bars as grab bars.  Use non-skid mats or decals in the tub or shower.  If you need to sit down in the shower, use a plastic, non-slip stool.  Keep the floor dry. Clean up any water that spills on the floor as soon as it happens.  Remove soap buildup in the tub or shower regularly.  Attach bath mats securely with double-sided non-slip rug tape.  Do not have throw rugs and other things on the floor that can make you trip. WHAT CAN I DO IN THE BEDROOM?  Use night lights.  Make sure that you have a light by your bed that is easy to reach.  Do not use any sheets or blankets that are too big for your bed. They should not hang down onto the floor.  Have a firm chair that has side arms. You can use this for support while you get dressed.  Do not have throw rugs and other things on the floor that can make you trip. WHAT CAN I DO IN THE KITCHEN?  Clean up any spills right away.  Avoid walking on wet floors.  Keep items that you use a lot in easy-to-reach places.  If you need to reach something above you, use a strong step stool that has a grab bar.  Keep electrical cords out of the way.  Do not use floor polish or wax that makes floors slippery. If you must use wax, use non-skid floor wax.  Do not have throw rugs and other things on the floor that can make you trip. WHAT CAN I DO WITH MY STAIRS?  Do not leave any items on the stairs.  Make sure that there are handrails on both sides of the stairs and use them. Fix handrails that are broken or loose.  Make sure that handrails are as long as the stairways.  Check any carpeting to make sure that it is firmly attached to the stairs. Fix any carpet that is loose or worn.  Avoid having throw rugs at the top or bottom of the stairs. If you do have throw rugs, attach them to the floor with carpet tape.  Make sure that you have a light switch at the top of the stairs and the bottom of the stairs. If you do not have them, ask someone to add them for you. WHAT ELSE CAN I DO TO HELP PREVENT FALLS?  Wear shoes that:  Do not have high heels.  Have rubber bottoms.  Are comfortable and fit you well.  Are closed at the toe. Do not wear sandals.  If you use a stepladder:  Make sure that it is fully opened. Do not climb a closed stepladder.  Make sure that both sides of the stepladder are locked into place.  Ask someone to hold it for you, if possible.  Clearly mark and make sure that you can see:  Any grab bars or handrails.  First and last steps.  Where the edge of each step is.  Use tools that help you move around (mobility aids) if they are needed. These include:  Canes.  Walkers.  Scooters.  Crutches.  Turn on the lights when you go into a dark area. Replace any light bulbs as soon as they burn out.  Set up your furniture so you have a clear path. Avoid moving your furniture around.  If any of your floors are uneven, fix them.  If there are any pets around you, be aware of where they are.  Review your medicines with your doctor. Some medicines can make you feel dizzy. This can increase your chance of falling. Ask your doctor what other things that you can do to help prevent falls.   This information is not intended to replace advice given to you by your health care provider. Make sure you discuss any questions you have with your health care provider.   Document Released: 12/02/2008 Document Revised: 06/22/2014 Document Reviewed: 03/12/2014 Elsevier Interactive Patient  Education 2016 [place] Maintenance, Female Adopting a healthy lifestyle and getting preventive care can go a long way to promote health and wellness. Talk with your health care provider about what schedule of regular examinations is right for you. This is a good chance for you to check in with your provider about disease prevention and staying healthy. In between checkups, there are plenty of things you can do on your own. Experts have done a lot of research about which lifestyle changes and preventive measures are most likely to keep you healthy. Ask your health care provider for more information. WEIGHT AND DIET  Eat a healthy diet  Be sure to include plenty of vegetables, fruits, low-fat dairy products, and lean protein.  Do not eat a lot of foods high in solid fats, added sugars, or salt.  Get regular exercise. This is one of the most important things you can do for your health.  Most adults should exercise for at least 150 minutes each week. The exercise should increase your heart rate and make you sweat (moderate-intensity exercise).  Most adults should also do strengthening exercises at least twice a week. This is in addition to the moderate-intensity exercise.  Maintain a healthy weight  Body mass index (BMI) is a measurement that can be used to identify possible weight problems. It estimates body fat based on height and weight.  Your health care provider can help determine your BMI and help you achieve or maintain a healthy weight.  For females 76 years of age and older:   A BMI below 18.5 is considered underweight.  A BMI of 18.5 to 24.9 is normal.  A BMI of 25 to 29.9 is considered overweight.  A BMI of 30 and above is considered obese.  Watch levels of cholesterol and blood lipids  You should start having your blood tested for lipids and cholesterol at 63 years of age, then have this test every 5 years.  You may need to have your cholesterol levels  checked more often if:  Your lipid or cholesterol levels are high.  You are older than 63 years of age.  You are at high risk for heart disease.  CANCER SCREENING   Lung Cancer  Lung cancer screening is recommended for adults 55-28 years old who are at high risk for lung cancer because of a history of smoking.  A yearly low-dose CT scan of the lungs is recommended for people who:  Currently smoke.  Have quit within the past 15 years.  Have at least a 30-pack-year history of smoking. A pack year is smoking an average of one pack of cigarettes a day for 1 year.  Yearly screening should continue until it has been 15 years since you quit.  Yearly screening should stop if you develop a health problem that would prevent you from having lung cancer treatment.  Breast Cancer  Practice breast self-awareness. This means understanding how your breasts normally appear and feel.  It also means doing regular breast self-exams. Let your health care provider know about any changes, no matter how small.  If you are in your 20s or 30s, you should have a clinical breast exam (CBE) by a health care provider every 1-3 years as part of a regular health exam.  If you are 43 or older, have a CBE every year. Also consider having a breast X-ray (mammogram) every year.  If you have a family history of breast cancer, talk to your health care provider about genetic screening.  If you are at high risk for breast cancer, talk to your health care provider about having an MRI and a mammogram every year.  Breast cancer gene (BRCA) assessment is recommended for women who have family members with BRCA-related cancers. BRCA-related cancers include:  Breast.  Ovarian.  Tubal.  Peritoneal cancers.  Results of the assessment will determine the need for genetic counseling and BRCA1 and BRCA2 testing. Cervical Cancer Your health care provider may recommend that you be screened regularly for cancer of the  pelvic organs (ovaries, uterus, and vagina). This screening involves a pelvic examination, including checking for microscopic changes to the surface of your cervix (Pap test). You may be encouraged to have this screening done every 3 years, beginning at age 20.  For women ages 15-65, health care providers may recommend pelvic exams and Pap testing every 3 years, or they may recommend the Pap and pelvic exam, combined with testing for human papilloma virus (HPV), every 5 years. Some types of HPV increase your risk of cervical cancer. Testing for HPV may also be done on women of any age with unclear Pap test results.  Other health care providers may not recommend any screening for nonpregnant women who are considered low risk for pelvic cancer and who do not have symptoms. Ask your health care provider if a screening pelvic exam is right for you.  If you have had past treatment for cervical cancer or a condition that could lead to cancer, you need Pap tests and screening for cancer for at least 20 years after your treatment. If Pap tests have been discontinued, your risk factors (such as having a new sexual partner) need to be reassessed to determine if screening should resume. Some women have medical problems that increase the chance of getting cervical cancer. In these cases, your health care provider may recommend more frequent screening and Pap tests. Colorectal Cancer  This type of cancer can be detected and often prevented.  Routine colorectal cancer screening usually begins at 63 years of age and continues through 63 years of age.  Your health care provider may recommend screening at an earlier age if you have risk factors for colon cancer.  Your health care provider may also recommend using home test kits to check for hidden blood in the stool.  A small camera at the end of a tube can be used to examine your colon directly (sigmoidoscopy or colonoscopy). This is done to check for the earliest  forms of colorectal cancer.  Routine screening usually begins at age 37.  Direct examination of the colon should be repeated every 5-10 years through 63 years of age. However, you may need to be screened more often if early forms of precancerous polyps or small growths are found. Skin Cancer  Check your skin from head to toe regularly.  Tell your health care provider about any new moles or changes in moles, especially if there is a change in a mole's shape or color.  Also tell your health care provider if you have a mole that is larger than the size of a pencil eraser.  Always use sunscreen. Apply sunscreen liberally and repeatedly throughout the day.  Protect yourself by wearing long sleeves, pants, a wide-brimmed hat, and sunglasses whenever you are outside. HEART DISEASE, DIABETES, AND HIGH BLOOD PRESSURE   High blood pressure causes heart disease and increases the risk of stroke. High blood pressure is more likely to develop in:  People who have blood pressure in the high end of the normal range (130-139/85-89 mm Hg).  People who are overweight or obese.  People who are African American.  If you are 81-26 years of age, have your blood pressure checked every 3-5 years. If you are 108 years of age or older, have your blood pressure checked every year. You should have your blood pressure measured twice--once when you are at a hospital or clinic, and once when you are not at a hospital or clinic. Record the average of the two measurements. To check your blood pressure when you are not at a hospital or clinic, you can use:  An automated blood pressure machine at a pharmacy.  A home blood pressure monitor.  If you are between 58 years and 34 years old, ask your health care provider if you should take aspirin to prevent strokes.  Have regular diabetes screenings. This involves taking a blood sample to check your fasting blood sugar level.  If you are at a normal weight and have a low  risk for diabetes, have this test once every three years after 63 years of age.  If you are overweight and have a high risk for diabetes, consider being tested at a younger age or more often. PREVENTING INFECTION  Hepatitis B  If you have a higher risk for hepatitis B, you should be screened for this virus. You are  considered at high risk for hepatitis B if:  You were born in a country where hepatitis B is common. Ask your health care provider which countries are considered high risk.  Your parents were born in a high-risk country, and you have not been immunized against hepatitis B (hepatitis B vaccine).  You have HIV or AIDS.  You use needles to inject street drugs.  You live with someone who has hepatitis B.  You have had sex with someone who has hepatitis B.  You get hemodialysis treatment.  You take certain medicines for conditions, including cancer, organ transplantation, and autoimmune conditions. Hepatitis C  Blood testing is recommended for:  Everyone born from [birth year] through 1965.  Anyone with known risk factors for hepatitis C. Sexually transmitted infections (STIs)  You should be screened for sexually transmitted infections (STIs) including gonorrhea and chlamydia if:  You are sexually active and are younger than 63 years of age.  You are older than 64 years of age and your health care provider tells you that you are at risk for this type of infection.  Your sexual activity has changed since you were last screened and you are at an increased risk for chlamydia or gonorrhea. Ask your health care provider if you are at risk.  If you do not have HIV, but are at risk, it may be recommended that you take a prescription medicine daily to prevent HIV infection. This is called pre-exposure prophylaxis (PrEP). You are considered at risk if:  You are sexually active and do not regularly use condoms or know the HIV status of your partner(s).  You take drugs by  injection.  You are sexually active with a partner who has HIV. Talk with your health care provider about whether you are at high risk of being infected with HIV. If you choose to begin PrEP, you should first be tested for HIV. You should then be tested every 3 months for as long as you are taking PrEP.  PREGNANCY   If you are premenopausal and you may become pregnant, ask your health care provider about preconception counseling.  If you may become pregnant, take 400 to 800 micrograms (mcg) of folic acid every day.  If you want to prevent pregnancy, talk to your health care provider about birth control (contraception). OSTEOPOROSIS AND MENOPAUSE   Osteoporosis is a disease in which the bones lose minerals and strength with aging. This can result in serious bone fractures. Your risk for osteoporosis can be identified using a bone density scan.  If you are 45 years of age or older, or if you are at risk for osteoporosis and fractures, ask your health care provider if you should be screened.  Ask your health care provider whether you should take a calcium or vitamin D supplement to lower your risk for osteoporosis.  Menopause may have certain physical symptoms and risks.  Hormone replacement therapy may reduce some of these symptoms and risks. Talk to your health care provider about whether hormone replacement therapy is right for you.  HOME CARE INSTRUCTIONS   Schedule regular health, dental, and eye exams.  Stay current with your immunizations.   Do not use any tobacco products including cigarettes, chewing tobacco, or electronic cigarettes.  If you are pregnant, do not drink alcohol.  If you are breastfeeding, limit how much and how often you drink alcohol.  Limit alcohol intake to no more than 1 drink per day for nonpregnant women. One drink equals 12 ounces of  beer, 5 ounces of wine, or 1 ounces of hard liquor.  Do not use street drugs.  Do not share needles.  Ask your  health care provider for help if you need support or information about quitting drugs.  Tell your health care provider if you often feel depressed.  Tell your health care provider if you have ever been abused or do not feel safe at home.   This information is not intended to replace advice given to you by your health care provider. Make sure you discuss any questions you have with your health care provider.   Document Released: 08/21/2010 Document Revised: 02/26/2014 Document Reviewed: 01/07/2013 Elsevier Interactive Patient Education 2016 [employer].   Hearing Loss Hearing loss is a partial or total loss of the ability to hear. This can be temporary or permanent, and it can happen in one or both ears. Hearing loss may be referred to as deafness. Medical care is necessary to treat hearing loss properly and to prevent the condition from getting worse. Your hearing may partially or completely come back, depending on what caused your hearing loss and how severe it is. In some cases, hearing loss is permanent. CAUSES Common causes of hearing loss include:   Too much wax in the ear canal.   Infection of the ear canal or middle ear.   Fluid in the middle ear.   Injury to the ear or surrounding area.   An object stuck in the ear.   Prolonged exposure to loud sounds, such as music.  Less common causes of hearing loss include:   Tumors in the ear.   Viral or bacterial infections, such as meningitis.   A hole in the eardrum (perforated eardrum).  Problems with the hearing nerve that sends signals between the brain and the ear.  Certain medicines.  SYMPTOMS  Symptoms of this condition may include:  Difficulty telling the difference between sounds.  Difficulty following a conversation when there is background noise.  Lack of response to sounds in your environment. This may be most noticeable when you do not respond to startling sounds.  Needing to turn up the volume  on the television, radio, etc.  Ringing in the ears.  Dizziness.  Pain in the ears. DIAGNOSIS This condition is diagnosed based on a physical exam and a hearing test (audiometry). The audiometry test will be performed by a hearing specialist (audiologist). You may also be referred to an ear, nose, and throat (ENT) specialist (otolaryngologist).  TREATMENT Treatment for recent onset of hearing loss may include:   Ear wax removal.   Being prescribed medicines to prevent infection (antibiotics).   Being prescribed medicines to reduce inflammation (corticosteroids).  HOME CARE INSTRUCTIONS  If you were prescribed an antibiotic medicine, take it as told by your health care provider. Do not stop taking the antibiotic even if you start to feel better.  Take over-the-counter and prescription medicines only as told by your health care provider.  Avoid loud noises.   Return to your normal activities as told by your health care provider. Ask your health care provider what activities are safe for you.  Keep all follow-up visits as told by your health care provider. This is important. SEEK MEDICAL CARE IF:   You feel dizzy.   You develop new symptoms.   You vomit or feel nauseous.   You have a fever.  SEEK IMMEDIATE MEDICAL CARE IF:  You develop sudden changes in your vision.   You have severe ear  pain.   You have new or increased weakness.  You have a severe headache.   This information is not intended to replace advice given to you by your health care provider. Make sure you discuss any questions you have with your health care provider.   Document Released: 02/05/2005 Document Revised: 10/27/2014 Document Reviewed: 06/23/2014 Elsevier Interactive Patient Education 2016 [place] A mammogram is an X-ray of the breasts that is done to check for abnormal changes. This procedure can screen for and detect any changes that may suggest breast cancer. A  mammogram can also identify other changes and variations in the breast, such as:  Inflammation of the breast tissue (mastitis).  An infected area that contains a collection of pus (abscess).  A fluid-filled sac (cyst).  Fibrocystic changes. This is when breast tissue becomes denser, which can make the tissue feel rope-like or uneven under the skin.  Tumors that are not cancerous (benign). LET [facility] CARE PROVIDER KNOW ABOUT:  Any allergies you have.  If you have breast implants.  If you have had previous breast disease, biopsy, or surgery.  If you are breastfeeding.  Any possibility that you could be pregnant, if this applies.  If you are younger than age 56.  If you have a family history of breast cancer. RISKS AND COMPLICATIONS Generally, this is a safe procedure. However, problems may occur, including:  Exposure to radiation. Radiation levels are very low with this test.  The results being misinterpreted.  The need for further tests.  The inability of the mammogram to detect certain cancers. BEFORE THE PROCEDURE  Schedule your test about 1-2 weeks after your menstrual period. This is usually when your breasts are the least tender.  If you have had a mammogram done at a different facility in the past, get the mammogram X-rays or have them sent to your current exam facility in order to compare them.  Wash your breasts and under your arms the day of the test.  Do not wear deodorants, perfumes, lotions, or powders anywhere on your body on the day of the test.  Remove any jewelry from your neck.  Wear clothes that you can change into and out of easily. PROCEDURE  You will undress from the waist up and put on a gown.  You will stand in front of the X-ray machine.  Each breast will be placed between two plastic or glass plates. The plates will compress your breast for a few seconds. Try to stay as relaxed as possible during the procedure. This does not cause  any harm to your breasts and any discomfort you feel will be very brief.  X-rays will be taken from different angles of each breast. The procedure may vary among health care providers and hospitals. AFTER THE PROCEDURE  The mammogram will be examined by a specialist (radiologist).  You may need to repeat certain parts of the test, depending on the quality of the images. This is commonly done if the radiologist needs a better view of the breast tissue.  Ask when your test results will be ready. Make sure you get your test results.  You may resume your normal activities.   This information is not intended to replace advice given to you by your health care provider. Make sure you discuss any questions you have with your health care provider.   Document Released: 02/03/2000 Document Revised: 10/27/2014 Document Reviewed: 04/16/2014 Elsevier Interactive Patient Education [employer].

## 2015-12-03 DIAGNOSIS — Z1231 Encounter for screening mammogram for malignant neoplasm of breast: Secondary | ICD-10-CM | POA: Diagnosis not present

## 2015-12-30 ENCOUNTER — Ambulatory Visit: Payer: Medicare Other | Admitting: Family Medicine

## 2016-01-02 ENCOUNTER — Encounter: Payer: Self-pay | Admitting: Family Medicine

## 2016-01-02 ENCOUNTER — Ambulatory Visit (INDEPENDENT_AMBULATORY_CARE_PROVIDER_SITE_OTHER): Payer: Medicare Other | Admitting: Family Medicine

## 2016-01-02 VITALS — BP 130/78 | HR 81 | Resp 12 | Ht 65.3 in | Wt 198.4 lb

## 2016-01-02 DIAGNOSIS — M5442 Lumbago with sciatica, left side: Secondary | ICD-10-CM

## 2016-01-02 DIAGNOSIS — I10 Essential (primary) hypertension: Secondary | ICD-10-CM | POA: Diagnosis not present

## 2016-01-02 DIAGNOSIS — R7301 Impaired fasting glucose: Secondary | ICD-10-CM

## 2016-01-02 DIAGNOSIS — M5441 Lumbago with sciatica, right side: Secondary | ICD-10-CM

## 2016-01-02 DIAGNOSIS — M549 Dorsalgia, unspecified: Secondary | ICD-10-CM

## 2016-01-02 DIAGNOSIS — G8929 Other chronic pain: Secondary | ICD-10-CM

## 2016-01-02 DIAGNOSIS — E782 Mixed hyperlipidemia: Secondary | ICD-10-CM | POA: Diagnosis not present

## 2016-01-02 LAB — LIPID PANEL
Cholesterol: 163 mg/dL (ref 0–200)
HDL: 50.2 mg/dL (ref >39.00)
LDL Cholesterol: 79 mg/dL (ref 0–99)
NonHDL: 112.96
Total CHOL/HDL Ratio: 3
Triglycerides: 169 mg/dL — ABNORMAL HIGH (ref 0.0–149.0)
VLDL: 33.8 mg/dL (ref 0.0–40.0)

## 2016-01-02 LAB — BASIC METABOLIC PANEL
BUN: 14 mg/dL (ref 6–23)
CO2: 31 mEq/L (ref 19–32)
Calcium: 9.7 mg/dL (ref 8.4–10.5)
Chloride: 102 mEq/L (ref 96–112)
Creatinine, Ser: 0.93 mg/dL (ref 0.40–1.20)
GFR: 78.18 mL/min (ref >60.00)
Glucose, Bld: 94 mg/dL (ref 70–99)
Potassium: 3 mEq/L — ABNORMAL LOW (ref 3.5–5.1)
Sodium: 143 mEq/L (ref 135–145)

## 2016-01-02 MED ORDER — DULOXETINE HCL 30 MG PO CPEP
30.0000 mg | ORAL_CAPSULE | Freq: Every day | ORAL | 1 refills | Status: DC
Start: 2016-01-02 — End: 2016-03-02

## 2016-01-02 MED ORDER — TRIAMTERENE-HCTZ 37.5-25 MG PO CAPS: 1.0000 | ORAL_CAPSULE | ORAL | 2 refills | Status: DC

## 2016-01-02 MED ORDER — METOPROLOL SUCCINATE ER 100 MG PO TB24: 100.0000 mg | ORAL_TABLET | Freq: Every day | ORAL | 2 refills | Status: DC

## 2016-01-02 MED ORDER — LISINOPRIL 40 MG PO TABS: 40.0000 mg | ORAL_TABLET | Freq: Every day | ORAL | 2 refills | Status: DC

## 2016-01-02 MED ORDER — SIMVASTATIN 20 MG PO TABS: 20.0000 mg | ORAL_TABLET | Freq: Every day | ORAL | 2 refills | Status: DC

## 2016-01-02 NOTE — Patient Instructions (Signed)
A few things to remember from today's visit:   Essential hypertension, benign - Plan: Basic metabolic panel  Hyperlipemia, mixed - Plan: Lipid panel  IFG (impaired fasting glucose) - Plan: Basic metabolic panel  Chronic bilateral low back pain with bilateral sciatica - Plan: DULoxetine (CYMBALTA) 30 MG capsule  No changes in current meds. Cymbalta started for back pain.    Please be sure medication list is accurate. If a new problem present, please set up appointment sooner than planned today.

## 2016-01-02 NOTE — Progress Notes (Signed)
[name] is a 65 y.o.female, who is here today to follow on HTN and some of her chronic medical problems.   HTN:  Currently Lisinopril 40 mg, Metoprolol succinate 100 mg daily, and Triamterene HCTZ 37.5/25 mg daily She is taking medications as instructed, no side effects reported.  She has not noted unusual headache, visual changes, exertional chest pain, dyspnea,  focal weakness, or edema.    Lab Results  Component Value Date   CREATININE 0.92 08/25/2015   BUN 12 08/25/2015   NA 141 08/25/2015   K 3.6 09/19/2015   CL 101 08/25/2015   CO2 31 08/25/2015    Hyperlipidemia:  Currently on Zocor 20 mg daily, changed from Pravastatin in 08/2015. Following a low fat diet: Yes.  She has not noted side effects with medication.  Lab Results  Component Value Date   CHOL 239 (H) 08/25/2015   HDL 47.00 08/25/2015   LDLDIRECT 160.0 08/25/2015   TRIG 243.0 (H) 08/25/2015   CHOLHDL 5 08/25/2015    She is still exercising regularly, twice per week she does aquatic exercises, according times per week she also does stretching exercises.   She also made some changes in her diet since her last office visit, decreased sweets in general. She has not noted much weight loss  IFG:  Lab Results  Component Value Date   HGBA1C 6.2 08/25/2015     Back pain: Chronic, she is on disability.   Pain is intermittently, exacerbated by certain activities as prolonged walking and lifting, alleviated by rest.  Pain is on lower back, radiated to both lower extremities with intermittent numbness;stable. Achy/sharp, 8/10. He has been worse for the past 3 days, she denies any injury. She is treating her pain with local heat/cold. She has done PT but didn't help. She denies any saddle anesthesia, urine, or bowel incontinence.  She denies arthralgias. She uses a cane to help with balance. She denies any history of depression or anxiety.   Review of Systems  Constitutional:  Negative for activity change, appetite change, fatigue, fever and unexpected weight change.  HENT: Negative for mouth sores, nosebleeds and trouble swallowing.   Eyes: Negative for pain, redness and visual disturbance.  Respiratory: Negative for cough, shortness of breath and wheezing.   Cardiovascular: Negative for chest pain, palpitations and leg swelling.  Gastrointestinal: Negative for abdominal pain, nausea and vomiting.       Negative for changes in bowel habits.  Genitourinary: Negative for decreased urine volume, difficulty urinating, dysuria and hematuria.  Musculoskeletal: Positive for back pain and gait problem. Negative for arthralgias and neck pain.  Skin: Negative for color change and rash.  Neurological: Positive for numbness. Negative for syncope, weakness and headaches.  Psychiatric/Behavioral: Negative for confusion. The patient is not nervous/anxious.      Current Outpatient Prescriptions on File Prior to Visit  Medication Sig Dispense Refill  . Ascorbic Acid (VITAMIN C) 100 MG tablet Take 100 mg by mouth daily.    Marland Kitchen aspirin 81 MG tablet Take 81 mg by mouth daily.    . cholecalciferol (VITAMIN D) 1000 UNITS tablet Take 1,000 Units by mouth daily.     No current facility-administered medications on file prior to visit.      Past Medical History:  Diagnosis Date  . High cholesterol   . Hypertension     Allergies  Allergen Reactions  . Penicillins Hives    Social History   Social History  . Marital status: Married  Spouse name: N/A  . Number of children: N/A  . Years of education: N/A   Social History Main Topics  . Smoking status: Never Smoker  . Smokeless tobacco: Never Used  . Alcohol use No  . Drug use: No  . Sexual activity: No   Other Topics Concern  . None   Social History Narrative  . None    Vitals:   01/02/16 0921  BP: 130/78  Pulse: 81  Resp: 12   Body mass index is 32.71 kg/m.  Wt Readings from Last 3 Encounters:    01/02/16 198 lb 6 oz (90 kg)  11/18/15 195 lb 5 oz (88.6 kg)  08/25/15 197 lb (89.4 kg)     Physical Exam  Nursing note and vitals reviewed. Constitutional: She is oriented to person, place, and time. She appears well-developed. No distress.  HENT:  Head: Atraumatic.  Mouth/Throat: Oropharynx is clear and moist and mucous membranes are normal. She has dentures.  Eyes: Conjunctivae and EOM are normal.  Cardiovascular: Normal rate and regular rhythm.   No murmur heard. Pulses:      Dorsalis pedis pulses are 2+ on the right side, and 2+ on the left side.  Respiratory: Effort normal and breath sounds normal. No respiratory distress.  GI: Soft. She exhibits no mass. There is no tenderness.  Musculoskeletal: She exhibits no edema or tenderness.       Thoracic back: She exhibits no bony tenderness.       Lumbar back: She exhibits no bony tenderness.  No tenderness upon palpation of paraspinal thoracic or lumbar muscles. Pain with movement on exam table during examination. Knee crepitus bilateral, no pain, right knee stiff.  Neurological: She is alert and oriented to person, place, and time. She has normal strength. Coordination normal.  Stable gait assisted with cane.  Skin: Skin is warm. No erythema.  Psychiatric: She has a normal mood and affect. Cognition and memory are normal.  Well groomed, good eye contact.    ASSESSMENT AND PLAN:   Meghan MccreedyBarbara was seen today for follow-up.  Diagnoses and all orders for this visit:  Lab Results  Component Value Date   CREATININE 0.93 01/02/2016   BUN 14 01/02/2016   NA 143 01/02/2016   K 3.0 (L) 01/02/2016   CL 102 01/02/2016   CO2 31 01/02/2016    Essential hypertension, benign  Adequately controlled. No changes in current management. DASH-low salt diet recommended. Eye exam periodically. F/U in 6 months, before if needed.   -     Basic metabolic panel -     metoprolol succinate (TOPROL-XL) 100 MG 24 hr tablet; Take 1 tablet  (100 mg total) by mouth daily. Take with or immediately following a meal. -     lisinopril (PRINIVIL,ZESTRIL) 40 MG tablet; Take 1 tablet (40 mg total) by mouth daily. -     triamterene-hydrochlorothiazide (DYAZIDE) 37.5-25 MG capsule; Take 1 each (1 capsule total) by mouth every morning.  Hyperlipemia, mixed  No changes in current management, will follow labs done today and will give further recommendations accordingly.  -     Lipid panel -     simvastatin (ZOCOR) 20 MG tablet; Take 1 tablet (20 mg total) by mouth daily.  IFG (impaired fasting glucose)  Primary prevention through a healthy lifestyle discussed. Further recommendations will be given according to lab results.  -     Basic metabolic panel  Chronic bilateral low back pain with bilateral sciatica  She agrees with trying Cymbalta,  some side effect discussed. Low impact exercise and wt loss may also help. F/U in 8 weeks, before if needed.   -     DULoxetine (CYMBALTA) 30 MG capsule; Take 1 capsule (30 mg total) by mouth daily.      -Ms. Brynda RimBarbara Hemme advised to return sooner than planned today if new concerns arise.    Betty G. SwazilandJordan, MD  [facility]. [place] office.

## 2016-01-05 DIAGNOSIS — G8929 Other chronic pain: Secondary | ICD-10-CM | POA: Insufficient documentation

## 2016-01-05 DIAGNOSIS — M5441 Lumbago with sciatica, right side: Secondary | ICD-10-CM

## 2016-01-05 DIAGNOSIS — M5442 Lumbago with sciatica, left side: Secondary | ICD-10-CM

## 2016-01-09 ENCOUNTER — Other Ambulatory Visit: Payer: Self-pay | Admitting: Family Medicine

## 2016-01-09 DIAGNOSIS — E876 Hypokalemia: Secondary | ICD-10-CM

## 2016-01-25 ENCOUNTER — Other Ambulatory Visit (INDEPENDENT_AMBULATORY_CARE_PROVIDER_SITE_OTHER): Payer: Medicare Other

## 2016-01-25 DIAGNOSIS — E876 Hypokalemia: Secondary | ICD-10-CM

## 2016-01-25 LAB — POTASSIUM: Potassium: 3.8 mEq/L (ref 3.5–5.1)

## 2016-03-02 ENCOUNTER — Encounter: Payer: Self-pay | Admitting: Family Medicine

## 2016-03-02 ENCOUNTER — Ambulatory Visit (INDEPENDENT_AMBULATORY_CARE_PROVIDER_SITE_OTHER): Payer: Medicare Other | Admitting: Family Medicine

## 2016-03-02 VITALS — BP 146/100 | HR 90 | Temp 98.2°F | Resp 12 | Ht 65.0 in | Wt 196.2 lb

## 2016-03-02 DIAGNOSIS — J309 Allergic rhinitis, unspecified: Secondary | ICD-10-CM | POA: Diagnosis not present

## 2016-03-02 DIAGNOSIS — E876 Hypokalemia: Secondary | ICD-10-CM | POA: Diagnosis not present

## 2016-03-02 DIAGNOSIS — M5441 Lumbago with sciatica, right side: Secondary | ICD-10-CM

## 2016-03-02 DIAGNOSIS — I10 Essential (primary) hypertension: Secondary | ICD-10-CM

## 2016-03-02 DIAGNOSIS — M5442 Lumbago with sciatica, left side: Secondary | ICD-10-CM

## 2016-03-02 DIAGNOSIS — G8929 Other chronic pain: Secondary | ICD-10-CM | POA: Diagnosis not present

## 2016-03-02 MED ORDER — FLUTICASONE PROPIONATE 50 MCG/ACT NA SUSP: 1.0000 | Freq: Two times a day (BID) | NASAL | 3 refills | Status: DC

## 2016-03-02 MED ORDER — DULOXETINE HCL 60 MG PO CPEP: 60.0000 mg | ORAL_CAPSULE | Freq: Every day | ORAL | 1 refills | Status: DC

## 2016-03-02 MED ORDER — TRIAMTERENE-HCTZ 37.5-25 MG PO TABS: 0.5000 | ORAL_TABLET | Freq: Every day | ORAL | 0 refills | Status: DC

## 2016-03-02 MED ORDER — AZELASTINE HCL 0.1 % NA SOLN: 2.0000 | Freq: Two times a day (BID) | NASAL | 3 refills | Status: DC

## 2016-03-02 MED ORDER — ATORVASTATIN CALCIUM 20 MG PO TABS: 20.0000 mg | ORAL_TABLET | Freq: Every day | ORAL | 1 refills | Status: DC

## 2016-03-02 MED ORDER — AMLODIPINE BESYLATE 5 MG PO TABS: 5.0000 mg | ORAL_TABLET | Freq: Every day | ORAL | 2 refills | Status: DC

## 2016-03-02 NOTE — Progress Notes (Signed)
HPI:   Meghan Nichols is a 66 y.o. female, who is here today to follow on her last OV, when Cymbalta 30 mg was recommended to treat chronic lower back pain with radiculopathy. She was seen last 01/02/2016, when she was reporting Hx of chronic lower back pain, radiated to both lower extremities, intermittent numbness. In general pain is constant with periods of exacerbation, Max 8/10. She denies saddle anesthesia or associated urine/bowel incontinence.  She has tried PT in the past but she didn't feel like it was helping.  According to patient, she couldn't get a second refill for Cymbalta despite having a refill left. She took Cymbalta 30 mg until 02/03/2016. She denies any side effect from medication, no significant difference in pain level. She denies depressed mood or suicidal thoughts.   Hypertension:  Her BP is elevated today, which she attributes to pain.  Currently Triamterene 37.5-25 mg daily and Metoprolol Succinate 100 mg daily.  Reporting no side effects from medication. She denies any frequent/severe headache, visual changes, chest pain, dyspnea, palpitation, abdominal pain, nausea, vomiting, or edema.  Lab Results  Component Value Date   CREATININE 0.93 01/02/2016   BUN 14 01/02/2016   NA 143 01/02/2016   K 3.8 01/25/2016   CL 102 01/02/2016   CO2 31 01/02/2016   BP's at home 127/87 a few days ago, has had SBP's 160's-180's when pain is severe.  HypoK+, she cannot take KCL because difficulty swallowing big tabs.  -Today she is also concern about "sinus issues." She has history of allergic rhinitis, for the past 2 weeks she has had rhinorrhea, nasal congestion, postnasal drainage, and occasionally sore throat. She has not taking OTC medication. She has not noted chills, fever, unusual myalgias,or swollen glands.  + Frontal pressure headache, usually in the morning. No sick contact or recent travel.    Review of Systems  Constitutional: Negative  for activity change, appetite change, fatigue, fever and unexpected weight change.  HENT: Positive for postnasal drip, rhinorrhea and sinus pressure. Negative for facial swelling, mouth sores, nosebleeds, trouble swallowing and voice change.   Eyes: Negative for pain and visual disturbance.  Respiratory: Negative for cough, shortness of breath and wheezing.   Cardiovascular: Negative for chest pain, palpitations and leg swelling.  Gastrointestinal: Negative for abdominal pain, nausea and vomiting.       Negative for changes in bowel habits.  Musculoskeletal: Positive for back pain and gait problem. Negative for arthralgias and neck pain.  Allergic/Immunologic: Positive for environmental allergies.  Neurological: Positive for numbness and headaches. Negative for syncope and weakness.  Psychiatric/Behavioral: Negative for confusion and suicidal ideas. The patient is not nervous/anxious.     Current Outpatient Prescriptions on File Prior to Visit  Medication Sig Dispense Refill  . Ascorbic Acid (VITAMIN C) 100 MG tablet Take 100 mg by mouth daily.    Marland Kitchen. aspirin 81 MG tablet Take 81 mg by mouth daily.    . cholecalciferol (VITAMIN D) 1000 UNITS tablet Take 1,000 Units by mouth daily.    Marland Kitchen. lisinopril (PRINIVIL,ZESTRIL) 40 MG tablet Take 1 tablet (40 mg total) by mouth daily. 90 tablet 2  . metoprolol succinate (TOPROL-XL) 100 MG 24 hr tablet Take 1 tablet (100 mg total) by mouth daily. Take with or immediately following a meal. 90 tablet 2   No current facility-administered medications on file prior to visit.      Past Medical History:  Diagnosis Date  . High cholesterol   . Hypertension  Allergies  Allergen Reactions  . Penicillins Hives    Social History   Social History  . Marital status: Married    Spouse name: N/A  . Number of children: N/A  . Years of education: N/A   Social History Main Topics  . Smoking status: Never Smoker  . Smokeless tobacco: Never Used  . Alcohol  use No  . Drug use: No  . Sexual activity: No   Other Topics Concern  . None   Social History Narrative  . None    Vitals:   03/02/16 1017  BP: (!) 146/100  Pulse: 90  Resp: 12  Temp: 98.2 F (36.8 C)   O2 sat 95% at RA.  Body mass index is 32.66 kg/m.   Physical Exam  Nursing note and vitals reviewed. Constitutional: She is oriented to person, place, and time. She appears well-developed. No distress.  HENT:  Head: Atraumatic.  Nose: Rhinorrhea present. Right sinus exhibits no maxillary sinus tenderness and no frontal sinus tenderness. Left sinus exhibits no maxillary sinus tenderness and no frontal sinus tenderness.  Mouth/Throat: Oropharynx is clear and moist and mucous membranes are normal.  Hypertrophic turbinates.  Eyes: Conjunctivae and EOM are normal.  Cardiovascular: Normal rate and regular rhythm.   No murmur heard. Pulses:      Dorsalis pedis pulses are 2+ on the right side, and 2+ on the left side.  Respiratory: Effort normal and breath sounds normal. No respiratory distress.  Musculoskeletal: She exhibits no edema.  No tenderness upon palpation of paraspinal muscle with palpation bilateral. Pain with movement on exam table and antalgic gait.  Lymphadenopathy:    She has no cervical adenopathy.  Neurological: She is alert and oriented to person, place, and time. She has normal strength. Coordination normal.  Stable gait assisted by cane  Skin: Skin is warm. No erythema.  Psychiatric: She has a normal mood and affect.  Well groomed, good eye contact.      ASSESSMENT AND PLAN:   [name] was seen today for follow-up, back pain and sinus problem.  Diagnoses and all orders for this visit:    Chronic bilateral low back pain with bilateral sciatica  Stable. She tolerated Cymbalta well, so she agrees with continuing Cymbalta 60 mg daily. Some side effects from medications discussed. F/U in 2 months.  -     DULoxetine (CYMBALTA) 60 MG capsule;  Take 1 capsule (60 mg total) by mouth daily.  Allergic rhinitis, unspecified chronicity, unspecified seasonality, unspecified trigger  Nasal irrigations with saline. Flonase nasal spray and Astelin nasal spray recommended. May consider Singulair and/or OTC Allegra if symptoms still not well controlled.  -     fluticasone (FLONASE) 50 MCG/ACT nasal spray; Place 1 spray into both nostrils 2 (two) times daily. -     azelastine (ASTELIN) 0.1 % nasal spray; Place 2 sprays into both nostrils 2 (two) times daily. Use in each nostril as directed  Essential hypertension, benign  Not well controlled. Possible complications of elevated BP discussed. Amlodipine added today, Triamterene-HCTZ decreased to 1/2 tab, and no changes in Metoprolol. Monitor BP at home.  F/U in 2 months.  -     amLODipine (NORVASC) 5 MG tablet; Take 1 tablet (5 mg total) by mouth daily. -     triamterene-hydrochlorothiazide (MAXZIDE-25) 37.5-25 MG tablet; Take 0.5 tablets by mouth daily.  Hypokalemia  Triamterene-HCTZ decreased to 1/2 tab daily. F/U in 2 months.  Other orders Because Amlodipine added, Zocor discontinued and Lipitor added.  -  atorvastatin (LIPITOR) 20 MG tablet; Take 1 tablet (20 mg total) by mouth daily.     -Ms. Deklyn Trachtenberg was advised to return sooner than planned today if new concerns arise.       Betty G. [place], MD  [facility]. [place] office.

## 2016-03-02 NOTE — Progress Notes (Signed)
Pre visit review using our clinic review tool, if applicable. No additional management support is needed unless otherwise documented below in the visit note. 

## 2016-03-02 NOTE — Patient Instructions (Addendum)
A few things to remember from today's visit:   Chronic bilateral low back pain with bilateral sciatica - Plan: DULoxetine (CYMBALTA) 60 MG capsule  Essential hypertension, benign - Plan: amLODipine (NORVASC) 5 MG tablet, triamterene-hydrochlorothiazide (MAXZIDE-25) 37.5-25 MG tablet  Hypokalemia  Allergic rhinitis, unspecified chronicity, unspecified seasonality, unspecified trigger - Plan: fluticasone (FLONASE) 50 MCG/ACT nasal spray, azelastine (ASTELIN) 0.1 % nasal spray  Triamterene hydrochlorothiazide decreased to 1/2 tab. Amlodipine 5 mg added,'Monitor blood pressure at home. Cymbalta increased to 60 mg and Lipitor added. Stop Zocor.    Please be sure medication list is accurate. If a new problem present, please set up appointment sooner than planned today.

## 2016-03-03 ENCOUNTER — Other Ambulatory Visit: Payer: Self-pay | Admitting: Family Medicine

## 2016-03-03 DIAGNOSIS — I10 Essential (primary) hypertension: Secondary | ICD-10-CM

## 2016-04-29 NOTE — Progress Notes (Deleted)
HPI:   [name] is a 66 y.o. female, who is here today for 2months follow up. She was last seen on 03/06/16.  Hypertension:  Since 1989. Currently on Triamterene-HCTZ 25-37.5 mg , which was decreased last OV to 1/2 tab and Amlodipine 5 mg added. She is also on Lisinopril 40 mg and Metoprolol Succinate 100 mg daily. Home BP's: *** ***taking medications as instructed, no side effects reported.  ***has not noted unusual headache, visual changes, exertional chest pain, dyspnea,  focal weakness, or edema.   Lab Results  Component Value Date   CREATININE 0.93 01/02/2016   BUN 14 01/02/2016   NA 143 01/02/2016   K 3.8 01/25/2016   CL 102 01/02/2016   CO2 31 01/02/2016    Allergic rhinitis: Last Ov Astelin and Flonase nasal spray added.  *** Chronic back pain: + Radiculopathy. Cymbalta increased from 30 mg to 60 mg. Back pain is constant, ***/10, radiated to LE bilateral and with intermittent numbness sensation. Exacerbated by prolonged standing and walking. Denies saddle anesthesia or changes in urine/bowel continence.      Concerns today: ***     Review of Systems    Current Outpatient Prescriptions on File Prior to Visit  Medication Sig Dispense Refill  . amLODipine (NORVASC) 5 MG tablet Take 1 tablet (5 mg total) by mouth daily. 30 tablet 2  . Ascorbic Acid (VITAMIN C) 100 MG tablet Take 100 mg by mouth daily.    Marland Kitchen aspirin 81 MG tablet Take 81 mg by mouth daily.    Marland Kitchen atorvastatin (LIPITOR) 20 MG tablet Take 1 tablet (20 mg total) by mouth daily. 90 tablet 1  . azelastine (ASTELIN) 0.1 % nasal spray Place 2 sprays into both nostrils 2 (two) times daily. Use in each nostril as directed 30 mL 3  . cholecalciferol (VITAMIN D) 1000 UNITS tablet Take 1,000 Units by mouth daily.    . DULoxetine (CYMBALTA) 60 MG capsule Take 1 capsule (60 mg total) by mouth daily. 90 capsule 1  . fluticasone (FLONASE) 50 MCG/ACT nasal spray Place 1 spray into both  nostrils 2 (two) times daily. 16 g 3  . lisinopril (PRINIVIL,ZESTRIL) 40 MG tablet Take 1 tablet (40 mg total) by mouth daily. 90 tablet 2  . lisinopril (PRINIVIL,ZESTRIL) 40 MG tablet TAKE 1 TABLET(40 MG) BY MOUTH DAILY 90 tablet 0  . metoprolol succinate (TOPROL-XL) 100 MG 24 hr tablet Take 1 tablet (100 mg total) by mouth daily. Take with or immediately following a meal. 90 tablet 2  . triamterene-hydrochlorothiazide (DYAZIDE) 37.5-25 MG capsule TAKE ONE CAPSULE BY MOUTH EVERY MORNING 90 capsule 0  . triamterene-hydrochlorothiazide (MAXZIDE-25) 37.5-25 MG tablet Take 0.5 tablets by mouth daily. 45 tablet 0   No current facility-administered medications on file prior to visit.      Past Medical History:  Diagnosis Date  . High cholesterol   . Hypertension    Allergies  Allergen Reactions  . Penicillins Hives    Social History   Social History  . Marital status: Married    Spouse name: N/A  . Number of children: N/A  . Years of education: N/A   Social History Main Topics  . Smoking status: Never Smoker  . Smokeless tobacco: Never Used  . Alcohol use No  . Drug use: No  . Sexual activity: No   Other Topics Concern  . Not on file   Social History Narrative  . No narrative on file    There  were no vitals filed for this visit. There is no height or weight on file to calculate BMI.      Physical Exam    ASSESSMENT AND PLAN:     There are no diagnoses linked to this encounter.         -[name] was advised to return sooner than planned today if new concerns arise.       Theador Jezewski G. [place], MD  [facility]. [place] office.

## 2016-04-30 ENCOUNTER — Ambulatory Visit: Payer: Medicare Other | Admitting: Family Medicine

## 2016-04-30 ENCOUNTER — Other Ambulatory Visit: Payer: Self-pay | Admitting: Family Medicine

## 2016-04-30 DIAGNOSIS — I10 Essential (primary) hypertension: Secondary | ICD-10-CM

## 2016-05-06 NOTE — Progress Notes (Signed)
HPI:   [name] is a 66 y.o. female, who is here today to follow on some chronic medical problems.  Last seen on 03/02/16.  HTN: Last OV Amlodipine 5 mg added and Maxzide decreased to 1/2 tab. She is also on Lisinopril 40 mg daily and Metoprolol XL 100 mg daily. She thinks Lisinopril is causing frequent dysphonia and dry cough. 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 Hx of allergies, no heartburn.   BP readings at home: 110's-130/70-80's. Denies severe/frequent headache, visual changes, chest pain, dyspnea, palpitation, claudication, focal weakness, or edema.  Lab Results  Component Value Date   CREATININE 0.93 01/02/2016   BUN 14 01/02/2016   NA 143 01/02/2016   K 3.8 01/25/2016   CL 102 01/02/2016   CO2 31 01/02/2016    Chronic back pain: With radiculopathy, intermittent radiation to LE's, lateral aspect,burning and numb sensation.  Pain has been stable, sharp, 8/10,intermittent. Exacerbated by prolonged walking,standing, and when getting up after prolonged sitting.  Cymbalta increased from 30 mg to 60 mg, which she did not tolerate well, discontinued about 2 months ago. It was causing hallucination: Seeing white mice and insomnia.She tolerated 30 mg well but did not help with pain.   No saddle anesthesia or bowel/urine incontinence.   Allergic rhinitis:  Flonase and Astelin nasal spray recommended, she is reporting improvement of nasal congestion and rhinorrhea. She is not longer having frontal pressure headache. Occasional sneezing.  No other concerns today.   Review of Systems  Constitutional: Negative for activity change, appetite change, fatigue, fever and unexpected weight change.  HENT: Positive for voice change. Negative for congestion, mouth sores, nosebleeds, sore throat and trouble swallowing.   Eyes: Negative for redness and visual disturbance.  Respiratory: Positive  for cough. Negative for shortness of breath and wheezing.   Cardiovascular: Negative for chest pain, palpitations and leg swelling.  Gastrointestinal: Negative for abdominal pain, nausea and vomiting.       Negative for changes in bowel habits.  Genitourinary: Negative for decreased urine volume and hematuria.  Musculoskeletal: Positive for back pain and gait problem.  Skin: Negative for rash.  Allergic/Immunologic: Positive for environmental allergies.  Neurological: Positive for numbness. Negative for syncope, weakness and headaches.  Psychiatric/Behavioral: Negative for confusion and sleep disturbance. The patient is not nervous/anxious.       Current Outpatient Prescriptions on File Prior to Visit  Medication Sig Dispense Refill  . amLODipine (NORVASC) 5 MG tablet TAKE 1 TABLET(5 MG) BY MOUTH DAILY 90 tablet 1  . Ascorbic Acid (VITAMIN C) 100 MG tablet Take 100 mg by mouth daily.    Marland Kitchen. aspirin 81 MG tablet Take 81 mg by mouth daily.    Marland Kitchen. atorvastatin (LIPITOR) 20 MG tablet Take 1 tablet (20 mg total) by mouth daily. 90 tablet 1  . azelastine (ASTELIN) 0.1 % nasal spray Place 2 sprays into both nostrils 2 (two) times daily. Use in each nostril as directed 30 mL 3  . cholecalciferol (VITAMIN D) 1000 UNITS tablet Take 1,000 Units by mouth daily.    . fluticasone (FLONASE) 50 MCG/ACT nasal spray Place 1 spray into both nostrils 2 (two) times daily. 16 g 3  . metoprolol succinate (TOPROL-XL) 100 MG 24 hr tablet Take 1 tablet (100 mg total) by mouth daily. Take with or immediately following a meal. 90 tablet 2  . triamterene-hydrochlorothiazide (MAXZIDE-25) 37.5-25 MG tablet Take 0.5 tablets by  mouth daily. 45 tablet 0   No current facility-administered medications on file prior to visit.      Past Medical History:  Diagnosis Date  . High cholesterol   . Hypertension    Allergies  Allergen Reactions  . Penicillins Hives    Social History   Social History  . Marital status:  Married    Spouse name: N/A  . Number of children: N/A  . Years of education: N/A   Social History Main Topics  . Smoking status: Never Smoker  . Smokeless tobacco: Never Used  . Alcohol use No  . Drug use: No  . Sexual activity: No   Other Topics Concern  . None   Social History Narrative  . None    Vitals:   05/07/16 1210  BP: 126/80  Pulse: 94  Resp: 12  O2 sat 96% at RA. Body mass index is 32.2 kg/m.   Physical Exam  Nursing note and vitals reviewed. Constitutional: She is oriented to person, place, and time. She appears well-developed. No distress.  HENT:  Head: Atraumatic.  Mouth/Throat: Oropharynx is clear and moist and mucous membranes are normal. She has dentures.  Eyes: Conjunctivae and EOM are normal. Pupils are equal, round, and reactive to light.  Cardiovascular: Normal rate and regular rhythm.   No murmur heard. Pulses:      Dorsalis pedis pulses are 2+ on the right side, and 2+ on the left side.  Respiratory: Effort normal and breath sounds normal. No respiratory distress.  GI: Soft. She exhibits no mass. There is no hepatomegaly. There is no tenderness.  Musculoskeletal: She exhibits no edema.       Thoracic back: She exhibits no tenderness and no bony tenderness.  + Tenderness upon palpation of paraspinal lumbar muscles, right side. Knee crepitus bilateral, no pain elicited.  Lymphadenopathy:    She has no cervical adenopathy.  Neurological: She is alert and oriented to person, place, and time. She has normal strength. Coordination normal.  Reflex Scores:      Patellar reflexes are 2+ on the right side and 2+ on the left side. SLR negative bilateral. Stable gait assisted with cane.  Skin: Skin is warm. No erythema.  Psychiatric: She has a normal mood and affect. Cognition and memory are normal.  Well groomed, good eye contact.      ASSESSMENT AND PLAN:   Meghan Nichols was seen today for follow-up.  Diagnoses and all orders for this  visit:  Chronic bilateral low back pain with bilateral sciatica  Stable otherwise She did not tolerate Cymbalta 60 mg. She took Tramadol in the past and denies side effects,so Rx given,bid prn.We discussed some side effects. Fall precautions. Gabapentin at bedtime may help with radicular pain,side effects discussed. F/U in 2 months.  -     gabapentin (NEURONTIN) 100 MG capsule; Take 1 capsule (100 mg total) by mouth at bedtime. -     traMADol (ULTRAM) 50 MG tablet; Take 1 tablet (50 mg total) by mouth every 12 (twelve) hours as needed.  Essential hypertension, benign  Improved,adequately controlled but because Lisinopril could be causing cough,it was d/c and Losartan started. Rest of medications no changes. DASH-low salt diet recommended. Monitor BP at home. Eye exam recommended annually. F/U in 2months, before if needed.   -     losartan (COZAAR) 100 MG tablet; Take 1 tablet (100 mg total) by mouth daily. -     Basic metabolic panel  Allergic rhinitis, unspecified chronicity, unspecified seasonality, unspecified  trigger  Improved. No changes in current management. F/U in 6-12 months.  Cough  Possible causes discussed: allergies,GERD,and med. Lung auscultation negative,so for now will hold on imaging. Stop Lisinopril and monitor for changes. F/U in 2 months.     -[name] was advised to return sooner than planned today if new concerns arise.       Timothy Townsel G. [place], MD  [facility]. [place] office.

## 2016-05-07 ENCOUNTER — Encounter: Payer: Self-pay | Admitting: Family Medicine

## 2016-05-07 ENCOUNTER — Ambulatory Visit (INDEPENDENT_AMBULATORY_CARE_PROVIDER_SITE_OTHER): Payer: Medicare Other | Admitting: Family Medicine

## 2016-05-07 VITALS — BP 126/80 | HR 94 | Resp 12 | Ht 65.0 in | Wt 193.5 lb

## 2016-05-07 DIAGNOSIS — J309 Allergic rhinitis, unspecified: Secondary | ICD-10-CM

## 2016-05-07 DIAGNOSIS — M5442 Lumbago with sciatica, left side: Secondary | ICD-10-CM | POA: Diagnosis not present

## 2016-05-07 DIAGNOSIS — M5441 Lumbago with sciatica, right side: Secondary | ICD-10-CM | POA: Diagnosis not present

## 2016-05-07 DIAGNOSIS — G8929 Other chronic pain: Secondary | ICD-10-CM

## 2016-05-07 DIAGNOSIS — I10 Essential (primary) hypertension: Secondary | ICD-10-CM

## 2016-05-07 DIAGNOSIS — R05 Cough: Secondary | ICD-10-CM

## 2016-05-07 DIAGNOSIS — R059 Cough, unspecified: Secondary | ICD-10-CM

## 2016-05-07 LAB — BASIC METABOLIC PANEL
BUN: 12 mg/dL (ref 6–23)
CO2: 32 mEq/L (ref 19–32)
Calcium: 10.1 mg/dL (ref 8.4–10.5)
Chloride: 101 mEq/L (ref 96–112)
Creatinine, Ser: 0.84 mg/dL (ref 0.40–1.20)
GFR: 87.83 mL/min (ref >60.00)
Glucose, Bld: 86 mg/dL (ref 70–99)
Potassium: 3.2 mEq/L — ABNORMAL LOW (ref 3.5–5.1)
Sodium: 144 mEq/L (ref 135–145)

## 2016-05-07 MED ORDER — TRAMADOL HCL 50 MG PO TABS: 50.0000 mg | ORAL_TABLET | Freq: Two times a day (BID) | ORAL | 0 refills | Status: DC | PRN

## 2016-05-07 MED ORDER — GABAPENTIN 100 MG PO CAPS: 100.0000 mg | ORAL_CAPSULE | Freq: Every day | ORAL | 0 refills | Status: DC

## 2016-05-07 MED ORDER — LOSARTAN POTASSIUM 100 MG PO TABS: 100.0000 mg | ORAL_TABLET | Freq: Every day | ORAL | 0 refills | Status: DC

## 2016-05-07 NOTE — Patient Instructions (Signed)
A few things to remember from today's visit:   Chronic bilateral low back pain with bilateral sciatica - Plan: gabapentin (NEURONTIN) 100 MG capsule, traMADol (ULTRAM) 50 MG tablet  Essential hypertension, benign - Plan: losartan (COZAAR) 100 MG tablet, Basic metabolic panel  Allergic rhinitis, unspecified chronicity, unspecified seasonality, unspecified trigger  In about 3-4 weeks please let me know through My Chart or by calling the office about tolerance of new medication.  Today gabapentin added at bedtime for burning sensation on legs. Tramadol for pain. Fall precautions.  Lisinopril discontinued and Losartan started.Rest unchanged. Please be sure medication list is accurate. If a new problem present, please set up appointment sooner than planned today.

## 2016-05-07 NOTE — Progress Notes (Signed)
Pre visit review using our clinic review tool, if applicable. No additional management support is needed unless otherwise documented below in the visit note. 

## 2016-05-29 ENCOUNTER — Other Ambulatory Visit: Payer: Self-pay | Admitting: Family Medicine

## 2016-05-29 DIAGNOSIS — I10 Essential (primary) hypertension: Secondary | ICD-10-CM

## 2016-07-09 ENCOUNTER — Encounter: Payer: Self-pay | Admitting: Family Medicine

## 2016-07-09 ENCOUNTER — Ambulatory Visit (INDEPENDENT_AMBULATORY_CARE_PROVIDER_SITE_OTHER): Payer: Medicare Other | Admitting: Family Medicine

## 2016-07-09 VITALS — BP 150/90 | HR 92 | Temp 98.1°F | Resp 12 | Ht 65.0 in | Wt 195.0 lb

## 2016-07-09 DIAGNOSIS — I10 Essential (primary) hypertension: Secondary | ICD-10-CM

## 2016-07-09 DIAGNOSIS — M5441 Lumbago with sciatica, right side: Secondary | ICD-10-CM | POA: Diagnosis not present

## 2016-07-09 DIAGNOSIS — G8929 Other chronic pain: Secondary | ICD-10-CM | POA: Diagnosis not present

## 2016-07-09 DIAGNOSIS — E876 Hypokalemia: Secondary | ICD-10-CM | POA: Diagnosis not present

## 2016-07-09 DIAGNOSIS — J309 Allergic rhinitis, unspecified: Secondary | ICD-10-CM

## 2016-07-09 DIAGNOSIS — M5442 Lumbago with sciatica, left side: Secondary | ICD-10-CM | POA: Diagnosis not present

## 2016-07-09 LAB — BASIC METABOLIC PANEL
BUN: 10 mg/dL (ref 6–23)
CO2: 31 mEq/L (ref 19–32)
Calcium: 9.5 mg/dL (ref 8.4–10.5)
Chloride: 105 mEq/L (ref 96–112)
Creatinine, Ser: 0.79 mg/dL (ref 0.40–1.20)
GFR: 94.22 mL/min (ref >60.00)
Glucose, Bld: 96 mg/dL (ref 70–99)
Potassium: 3.2 mEq/L — ABNORMAL LOW (ref 3.5–5.1)
Sodium: 144 mEq/L (ref 135–145)

## 2016-07-09 MED ORDER — MONTELUKAST SODIUM 10 MG PO TABS: 10.0000 mg | ORAL_TABLET | Freq: Every day | ORAL | 3 refills | Status: DC

## 2016-07-09 MED ORDER — FEXOFENADINE HCL 180 MG PO TABS: 180.0000 mg | ORAL_TABLET | Freq: Every day | ORAL | 3 refills | Status: DC

## 2016-07-09 MED ORDER — GABAPENTIN 100 MG PO CAPS: 200.0000 mg | ORAL_CAPSULE | Freq: Every day | ORAL | 1 refills | Status: DC

## 2016-07-09 MED ORDER — TRAMADOL HCL 50 MG PO TABS: 50.0000 mg | ORAL_TABLET | Freq: Two times a day (BID) | ORAL | 2 refills | Status: DC | PRN

## 2016-07-09 MED ORDER — PREDNISONE 20 MG PO TABS: 40.0000 mg | ORAL_TABLET | Freq: Every day | ORAL | 0 refills | Status: AC

## 2016-07-09 NOTE — Progress Notes (Signed)
HPI:   [name] is a 66 y.o. female, who is here today to follow on some chronic medical problems.  Last f/u appt 05/07/16.  HTN: Last OV Lisinopril was changed to Cozaar 100 mg because cough. Cough improved. She is also on Metoprolol Succinate 100 mg daily. HypoK+: Maxzide was discontinued due to persistent hyperkalemia. K+ 3.2 Denies severe/frequent headache, visual changes, chest pain, dyspnea, palpitation, claudication, focal weakness, or edema.  BP is elevated today, which she attributes to pain and "sinuses", reporting BP numbers: 120s/80s. She has history of allergic rhinitis, currently she is on Flonase nasal spray and Astelin.  For the past few days she has had nasal congestion and sinus pressure.  She denies chills, fevers, or sick contact.  Chronic back pain: With radiation to both LE's, burning and numb sensation on lateral aspect of thighs. She was started on Gabapentin 100 mg at bedtime, did not titrate dose to 300 mg.  She is also on Tramadol 50 mg bid as needed, mainly at bedtime.  She did not tolerate Cymbalta, caused "hallucinations." 3 days ago while she was in the shower, she twisted and pulled a muscle on her back, this exacerbated pain. In general she feels like Gabapentin has helped, pain max 7/10,sharp.  She has not noted some anesthesia or urine/bowel incontinence.  She denies side effects from medications.   Review of Systems  Constitutional: Negative for activity change, appetite change, fatigue, fever and unexpected weight change.  HENT: Positive for congestion, postnasal drip, rhinorrhea and sinus pressure. Negative for mouth sores, nosebleeds, sore throat and trouble swallowing.   Eyes: Positive for itching. Negative for redness and visual disturbance.  Respiratory: Negative for cough, shortness of breath and wheezing.   Cardiovascular: Negative for chest pain, palpitations and leg swelling.  Gastrointestinal: Negative for  abdominal pain, nausea and vomiting.       Negative for changes in bowel habits.  Genitourinary: Negative for decreased urine volume, difficulty urinating and hematuria.  Musculoskeletal: Positive for back pain and gait problem.  Skin: Negative for rash.  Allergic/Immunologic: Positive for environmental allergies.  Neurological: Positive for headaches. Negative for syncope and weakness.  Psychiatric/Behavioral: Negative for confusion. The patient is nervous/anxious.      Current Outpatient Prescriptions on File Prior to Visit  Medication Sig Dispense Refill  . amLODipine (NORVASC) 5 MG tablet TAKE 1 TABLET(5 MG) BY MOUTH DAILY 90 tablet 1  . Ascorbic Acid (VITAMIN C) 100 MG tablet Take 100 mg by mouth daily.    Marland Kitchen aspirin 81 MG tablet Take 81 mg by mouth daily.    Marland Kitchen atorvastatin (LIPITOR) 20 MG tablet Take 1 tablet (20 mg total) by mouth daily. 90 tablet 1  . azelastine (ASTELIN) 0.1 % nasal spray Place 2 sprays into both nostrils 2 (two) times daily. Use in each nostril as directed 30 mL 3  . cholecalciferol (VITAMIN D) 1000 UNITS tablet Take 1,000 Units by mouth daily.    . fluticasone (FLONASE) 50 MCG/ACT nasal spray Place 1 spray into both nostrils 2 (two) times daily. 16 g 3  . losartan (COZAAR) 100 MG tablet Take 1 tablet (100 mg total) by mouth daily. 90 tablet 0  . metoprolol succinate (TOPROL-XL) 100 MG 24 hr tablet TAKE 1 TABLET BY MOUTH DAILY WITH OR IMMEDIATELY FOLLOWING A MEAL 90 tablet 1   No current facility-administered medications on file prior to visit.      Past Medical History:  Diagnosis Date  . High cholesterol   .  Hypertension    Allergies  Allergen Reactions  . Penicillins Hives    Social History   Social History  . Marital status: Married    Spouse name: N/A  . Number of children: N/A  . Years of education: N/A   Social History Main Topics  . Smoking status: Never Smoker  . Smokeless tobacco: Never Used  . Alcohol use No  . Drug use: No  .  Sexual activity: No   Other Topics Concern  . None   Social History Narrative  . None    Vitals:   07/09/16 1103 07/09/16 1125  BP: (!) 158/98 (!) 150/90  Pulse: 92   Resp: 12   Temp: 98.1 F (36.7 C)   O2 sat at RA 97% Body mass index is 32.45 kg/m.    Physical Exam  Nursing note and vitals reviewed. Constitutional: She is oriented to person, place, and time. She appears well-developed. No distress.  HENT:  Head: Atraumatic.  Nose: Right sinus exhibits no maxillary sinus tenderness and no frontal sinus tenderness. Left sinus exhibits no maxillary sinus tenderness and no frontal sinus tenderness.  Mouth/Throat: Oropharynx is clear and moist and mucous membranes are normal.  Hypertrophic turbinates.  Eyes: Conjunctivae and EOM are normal. Pupils are equal, round, and reactive to light.  Neck: No thyroid mass present.  Cardiovascular: Normal rate and regular rhythm.   No murmur heard. Pulses:      Dorsalis pedis pulses are 2+ on the right side, and 2+ on the left side.  Respiratory: Effort normal and breath sounds normal. No respiratory distress.  GI: Soft. There is no tenderness.  Musculoskeletal: She exhibits no edema.       Lumbar back: She exhibits tenderness. She exhibits no bony tenderness.  Mild limitation of knee full extension L>R. Tenderness upon palpation of bilateral lumbar paraspinal muscles, L3-L5. Antalgic gait.  Lymphadenopathy:    She has no cervical adenopathy.  Neurological: She is alert and oriented to person, place, and time. She has normal strength. Coordination normal.  SLR negative bilateral. Stable gait assisted by cane.  Skin: Skin is warm. No erythema.  Psychiatric: Her mood appears anxious. Her affect is blunt.  Well groomed, good eye contact.     ASSESSMENT AND PLAN:   Meghan Nichols was seen today for follow-up.  Diagnoses and all orders for this visit:  Lab Results  Component Value Date   CREATININE 0.79 07/09/2016   BUN 10  07/09/2016   NA 144 07/09/2016   K 3.2 (L) 07/09/2016   CL 105 07/09/2016   CO2 31 07/09/2016    Chronic bilateral low back pain with bilateral sciatica  Overall she feels like pain is better controlled. She agrees with increasing Gabapentin dose to 200 mg. No changes in Tramadol. Side effects of both medications discussed. Fall prevention. F/U in 3 months.  -     traMADol (ULTRAM) 50 MG tablet; Take 1 tablet (50 mg total) by mouth every 12 (twelve) hours as needed. -     gabapentin (NEURONTIN) 100 MG capsule; Take 2 capsules (200 mg total) by mouth at bedtime.  Essential hypertension, benign  Elevated today, she is reporting adequate BP's at home. No changes in current management for now. Continue monitoring BP at home. Possible complications of elevated BP discussed. Annual eye examination. Low salt diet. F/U in 3-4 months.  -     Basic metabolic panel  Allergic rhinitis, unspecified seasonality, unspecified trigger  Educated about Dx, avoid OTC decongestants or cold  med. Nasal irrigation with saline as needed. Add OTC Allegra 180 mg daily and Singulair 10 mg. No changes in Astelin and Flonase nasal spray. F/U in 3 months.   -     montelukast (SINGULAIR) 10 MG tablet; Take 1 tablet (10 mg total) by mouth at bedtime. -     fexofenadine (ALLEGRA ALLERGY) 180 MG tablet; Take 1 tablet (180 mg total) by mouth daily. -     predniSONE (DELTASONE) 20 MG tablet; Take 2 tablets (40 mg total) by mouth daily with breakfast.  Hypokalemia  K+ rich diet. Further recommendations will be given according to lab results.     -Ms. Brynda RimBarbara Nichols was advised to return sooner than planned today if new concerns arise.       Newell Frater G. [place], MD  [facility]. [place] office.

## 2016-07-09 NOTE — Patient Instructions (Signed)
A few things to remember from today's visit:   Chronic bilateral low back pain with bilateral sciatica - Plan: traMADol (ULTRAM) 50 MG tablet  Essential hypertension, benign - Plan: Basic metabolic panel  Allergic rhinitis, unspecified seasonality, unspecified trigger - Plan: montelukast (SINGULAIR) 10 MG tablet, fexofenadine (ALLEGRA ALLERGY) 180 MG tablet  Blood pressure goal for most people is less than 140/90. Some populations (older than 60) the goal is less than 150/90.  Most recent cardiologists' recommendations recommend blood pressure at or less than 130/80.   Elevated blood pressure increases the risk of strokes, heart and kidney disease, and eye problems. Regular physical activity and a healthy diet (DASH diet) usually help. Low salt diet. Take medications as instructed.  Caution with some over the counter medications as cold medications, dietary products (for weight loss), and Ibuprofen or Aleve (frequent use);all these medications could cause elevation of blood pressure.  Gabapentin increased to 200 mg at bedtime.  There are 2 forms of allergic rhinitis: . Seasonal (hay fever): Caused by an allergy to pollen and/or mold spores in the air. Pollen is the fine powder that comes from the stamen of flowering plants. It can be carried through the air and is easily inhaled. Symptoms are seasonal and usually occur in spring, late summer, and fall. Marland Kitchen. Perennial: Caused by other allergens such as dust mites, pet hair or dander, or mold. Symptoms occur year-round.  Symptoms: Your symptoms can vary, depending on the severity of your allergies. Symptoms can include: Sneezing, coughing.itching (mostly eyes, nose, mouth, throat and skin),runny nose,stuffy nose.headache,pressure in the nose and cheeks,ear fullness and popping, sore throat.watery, red, or swollen eyes,dark circles under your eyes,trouble smelling, and sometimes hives.  Allergic rhinitis cannot be prevented. You can help  your symptoms by avoiding the things that you are allergic, including: . Keeping windows closed. This is especially important during high-pollen seasons. . Washing your hands after petting animals. . Using dust- and mite-proof bedding and mattress covers. . Wearing glasses outside to protect your eyes. . Showering before bed to wash off allergens from hair and skin. You can also avoid things that can make your symptoms worse, such as: . aerosol sprays . air pollution . cold temperatures . humidity . irritating fumes . tobacco smoke . wind . wood smoke.   Antihistamines help reduce the sneezing, runny nose, and itchiness of allergies. These come in pill form and as nasal sprays. Allegra,Zyrtec,or Claritin are some examples. Decongestants, such as pseudoephedrine and phenylephrine, help temporarily relieve the stuffy nose of allergies. Decongestants are found in many medicines and come as pills, nose sprays, and nose drops. They could increase heart rate and cause tachycardia and tremor. Nasal Afrin should not be used for more than 3 days because you can become dependent on them. This causes you to feel even more stopped-up when you try to quit using them.  Nasal sprays: steroids or antihistaminics. Over the counter intranasal sterids: Nasocort,Rhinocort,or Flonase.You won't notice their benefits for up to 2 weeks after starting them. Allergy shots or sublingual tablets when other treatment do not help.This is done by immunologists.   Please be sure medication list is accurate. If a new problem present, please set up appointment sooner than planned today.

## 2016-07-29 ENCOUNTER — Other Ambulatory Visit: Payer: Self-pay | Admitting: Family Medicine

## 2016-07-29 DIAGNOSIS — I10 Essential (primary) hypertension: Secondary | ICD-10-CM

## 2016-08-25 ENCOUNTER — Other Ambulatory Visit: Payer: Self-pay | Admitting: Family Medicine

## 2016-10-07 NOTE — Progress Notes (Signed)
HPI:   [name] is a 66 y.o. female, who is here today for 3-4 months follow up.  She was last seen on 07/09/16.  HTN: Last OV her BP was elevated. She is currently on Cozaar 100 mg daily. Last eye exam: 2 years ago.  Home BP's reading at home: 143/84, 130/85, 140/89, 139/87, 163/90( she was having severe pain), 118/70, 130/86, 128/82, 130/87, 130/88. She is following low salt diet. Denies severe/frequent headache, visual changes, chest pain, dyspnea, palpitation, claudication, focal weakness, or edema.  Lab Results  Component Value Date   CREATININE 0.79 07/09/2016   BUN 10 07/09/2016   NA 144 07/09/2016   K 3.2 (L) 07/09/2016   CL 105 07/09/2016   CO2 31 07/09/2016   HypoK+: HCTZ was discontinued. She is eating bananas and drinking orange juice.  Chronic back pain: Radiated to LE's, bilateral. Burning and numbness, intermittent sensation on lateral aspect of thighs. She is currently on Tramadol 50 mg bid prn.  She did not tolerate Cymbalta. Gabapentin was increased from 100 mg to 200 mg daily. LE pain and numbness has improved.  Pain level 8/10.She tries local cold and rest first. If pain is not better she takes Tramadol 50 mg. Tramadol still helps , no side effects.   Allergic rhinitis:  Last OV Singulair 10 mg added to Astelin and Flonase nasal spray. She is also on Allegra 180 mg daily. She is reporting great improvement of symptoms. She is tolerating medication well. No concerns in this regard.  She is also concerned about weight gain. States that she tries to eat healthy, she is eating "a lot" fruit and vegetables as well as strep potato. She exercises twice daily, hold exercises.  She also mentions that she discontinued Lipitor because it was causing lower extremity achy pain, probably resolved after she stopped medication.  Lab Results  Component Value Date   CHOL 163 01/02/2016   HDL 50.20 01/02/2016   LDLCALC 79 01/02/2016   LDLDIRECT 160.0 08/25/2015   TRIG 169.0 (H) 01/02/2016   CHOLHDL 3 01/02/2016    Review of Systems  Constitutional: Negative for activity change, appetite change, fatigue and fever.  HENT: Negative for congestion, mouth sores, nosebleeds, sore throat and trouble swallowing.   Eyes: Negative for redness and visual disturbance.  Respiratory: Negative for cough, shortness of breath and wheezing.   Cardiovascular: Negative for chest pain, palpitations and leg swelling.  Gastrointestinal: Negative for abdominal pain, nausea and vomiting.       Negative for changes in bowel habits.  Genitourinary: Negative for decreased urine volume and hematuria.  Musculoskeletal: Positive for arthralgias, back pain and gait problem.  Skin: Negative for rash.  Allergic/Immunologic: Positive for environmental allergies.  Neurological: Positive for numbness (improved). Negative for syncope, weakness and headaches.  Psychiatric/Behavioral: Negative for confusion. The patient is nervous/anxious.       Current Outpatient Prescriptions on File Prior to Visit  Medication Sig Dispense Refill  . amLODipine (NORVASC) 5 MG tablet TAKE 1 TABLET(5 MG) BY MOUTH DAILY 90 tablet 1  . Ascorbic Acid (VITAMIN C) 100 MG tablet Take 100 mg by mouth daily.    Marland Kitchen aspirin 81 MG tablet Take 81 mg by mouth daily.    Marland Kitchen azelastine (ASTELIN) 0.1 % nasal spray Place 2 sprays into both nostrils 2 (two) times daily. Use in each nostril as directed 30 mL 3  . cholecalciferol (VITAMIN D) 1000 UNITS tablet Take 1,000 Units by mouth daily.    Marland Kitchen  fexofenadine (ALLEGRA ALLERGY) 180 MG tablet Take 1 tablet (180 mg total) by mouth daily. 90 tablet 3  . fluticasone (FLONASE) 50 MCG/ACT nasal spray Place 1 spray into both nostrils 2 (two) times daily. 16 g 3  . gabapentin (NEURONTIN) 100 MG capsule Take 2 capsules (200 mg total) by mouth at bedtime. 180 capsule 1  . losartan (COZAAR) 100 MG tablet TAKE 1 TABLET(100 MG) BY MOUTH DAILY 90 tablet 1    . metoprolol succinate (TOPROL-XL) 100 MG 24 hr tablet TAKE 1 TABLET BY MOUTH DAILY WITH OR IMMEDIATELY FOLLOWING A MEAL 90 tablet 1  . montelukast (SINGULAIR) 10 MG tablet Take 1 tablet (10 mg total) by mouth at bedtime. 30 tablet 3  . traMADol (ULTRAM) 50 MG tablet Take 1 tablet (50 mg total) by mouth every 12 (twelve) hours as needed. 40 tablet 2   No current facility-administered medications on file prior to visit.      Past Medical History:  Diagnosis Date  . High cholesterol   . Hypertension    Allergies  Allergen Reactions  . Penicillins Hives    Social History   Social History  . Marital status: Married    Spouse name: N/A  . Number of children: N/A  . Years of education: N/A   Social History Main Topics  . Smoking status: Never Smoker  . Smokeless tobacco: Never Used  . Alcohol use No  . Drug use: No  . Sexual activity: No   Other Topics Concern  . None   Social History Narrative  . None    Vitals:   10/08/16 1019  BP: 136/80  Pulse: 88  Resp: 12  SpO2: 95%   Body mass index is 33.68 kg/m.  Wt Readings from Last 3 Encounters:  10/08/16 202 lb 6 oz (91.8 kg)  07/09/16 195 lb (88.5 kg)  05/07/16 193 lb 8 oz (87.8 kg)     Physical Exam  Nursing note and vitals reviewed. Constitutional: She is oriented to person, place, and time. She appears well-developed. No distress.  HENT:  Head: Normocephalic and atraumatic.  Mouth/Throat: Oropharynx is clear and moist and mucous membranes are normal. She has dentures.  Eyes: Pupils are equal, round, and reactive to light. Conjunctivae are normal.  Cardiovascular: Normal rate and regular rhythm.   No murmur heard. Pulses:      Dorsalis pedis pulses are 2+ on the right side, and 2+ on the left side.  Respiratory: Effort normal and breath sounds normal. No respiratory distress.  GI: Soft. She exhibits no mass. There is no hepatomegaly. There is no tenderness.  Musculoskeletal: She exhibits edema (Trace  pitting LE edema, bilateral). She exhibits no tenderness.       Left hip: She exhibits decreased range of motion.  No tenderness over palpation of paraspinal muscles, thoracic and lumbar, bilateral. Pain is elicited with movement. Left hip pain with flexion, mild to moderate limitation of range of motion. Right hip no pain with movement, mild limitation of range of motion. Shoulders: No pain with range of motion, no significant limitation of ROM.  Lymphadenopathy:    She has no cervical adenopathy.  Neurological: She is alert and oriented to person, place, and time. She has normal strength. Coordination normal.  Gait gait is assisted with cane.  Skin: Skin is warm. No erythema.  Psychiatric: Her mood appears anxious.  Well groomed, good eye contact.      ASSESSMENT AND PLAN:   [name] was seen today for follow-up.  Diagnoses and all orders for this visit:  Lab Results  Component Value Date   CREATININE 0.79 10/08/2016   BUN 8 10/08/2016   NA 144 10/08/2016   K 3.1 (L) 10/08/2016   CL 104 10/08/2016   CO2 31 10/08/2016    Chronic bilateral low back pain with bilateral sciatica  This problem is stable overall. She is tolerating tramadol, reporting no side effects. No changes in current management.  Essential hypertension, benign  Adequately controlled. No changes in current management. DASH diet recommended. Eye exam recommended annually.She is overdue for eye exam. F/U in 5 months, before if needed.  -     Basic metabolic panel  Hypokalemia  For the recommendations would be given according to lab results.  -     Basic metabolic panel  Class 1 obesity with serious comorbidity and body mass index (BMI) of 33.0 to 33.9 in adult, unspecified obesity type  He gained about 7 pounds since her last visit. We discussed benefits of wt loss as well as adverse effects of obesity. Consistency with healthy diet and physical activity recommended.recommend being  cautious wood intake.   Hyperlipemia, mixed  She didn't tolerate Lipitor., She will continue low fat diet, non-pharmacologic treatment. We will recheck lipid panel in 5-6 months.  Allergic rhinitis, unspecified seasonality, unspecified trigger  Well controlled. No changes in current management. Follow-up in 12 months, before if needed.  Last prescription forTramadol fill on 07/09/16, she has 2 refills left ([facility] controlled subs web site). Since she is not taking Tramadol often, I think she can follow in 5 months, before if needed.    -[name] was advised to return sooner than planned today if new concerns arise.       Meghan Nichols G. [place], MD  [facility]. [place] office.

## 2016-10-08 ENCOUNTER — Ambulatory Visit (INDEPENDENT_AMBULATORY_CARE_PROVIDER_SITE_OTHER): Payer: Medicare Other | Admitting: Family Medicine

## 2016-10-08 ENCOUNTER — Encounter: Payer: Self-pay | Admitting: Family Medicine

## 2016-10-08 VITALS — BP 136/80 | HR 88 | Resp 12 | Ht 65.0 in | Wt 202.4 lb

## 2016-10-08 DIAGNOSIS — Z6833 Body mass index (BMI) 33.0-33.9, adult: Secondary | ICD-10-CM | POA: Insufficient documentation

## 2016-10-08 DIAGNOSIS — M5441 Lumbago with sciatica, right side: Secondary | ICD-10-CM

## 2016-10-08 DIAGNOSIS — E782 Mixed hyperlipidemia: Secondary | ICD-10-CM

## 2016-10-08 DIAGNOSIS — I10 Essential (primary) hypertension: Secondary | ICD-10-CM | POA: Diagnosis not present

## 2016-10-08 DIAGNOSIS — E876 Hypokalemia: Secondary | ICD-10-CM | POA: Diagnosis not present

## 2016-10-08 DIAGNOSIS — E669 Obesity, unspecified: Secondary | ICD-10-CM | POA: Diagnosis not present

## 2016-10-08 DIAGNOSIS — J309 Allergic rhinitis, unspecified: Secondary | ICD-10-CM

## 2016-10-08 DIAGNOSIS — G8929 Other chronic pain: Secondary | ICD-10-CM

## 2016-10-08 DIAGNOSIS — M5442 Lumbago with sciatica, left side: Secondary | ICD-10-CM

## 2016-10-08 LAB — BASIC METABOLIC PANEL
BUN: 8 mg/dL (ref 6–23)
CO2: 31 mEq/L (ref 19–32)
Calcium: 9.2 mg/dL (ref 8.4–10.5)
Chloride: 104 mEq/L (ref 96–112)
Creatinine, Ser: 0.79 mg/dL (ref 0.40–1.20)
GFR: 94.15 mL/min (ref >60.00)
Glucose, Bld: 99 mg/dL (ref 70–99)
Potassium: 3.1 mEq/L — ABNORMAL LOW (ref 3.5–5.1)
Sodium: 144 mEq/L (ref 135–145)

## 2016-10-08 NOTE — Patient Instructions (Signed)
A few things to remember from today's visit:   Chronic bilateral low back pain with bilateral sciatica  Essential hypertension, benign  Hypokalemia  Blood pressure goal for most people is less than 140/90. Some populations (older than 60) the goal is less than 150/90.  Most recent cardiologists' recommendations recommend blood pressure at or less than 130/80.   Elevated blood pressure increases the risk of strokes, heart and kidney disease, and eye problems. Regular physical activity and a healthy diet (DASH diet) usually help. Low salt diet. Take medications as instructed.  Caution with some over the counter medications as cold medications, dietary products (for weight loss), and Ibuprofen or Aleve (frequent use);all these medications could cause elevation of blood pressure.  DASH Eating Plan DASH stands for "Dietary Approaches to Stop Hypertension." The DASH eating plan is a healthy eating plan that has been shown to reduce high blood pressure (hypertension). It may also reduce your risk for type 2 diabetes, heart disease, and stroke. The DASH eating plan may also help with weight loss. What are tips for following this plan? General guidelines  Avoid eating more than 2,300 mg (milligrams) of salt (sodium) a day. If you have hypertension, you may need to reduce your sodium intake to 1,500 mg a day.  Limit alcohol intake to no more than 1 drink a day for nonpregnant women and 2 drinks a day for men. One drink equals 12 oz of beer, 5 oz of wine, or 1 oz of hard liquor.  Work with your health care provider to maintain a healthy body weight or to lose weight. Ask what an ideal weight is for you.  Get at least 30 minutes of exercise that causes your heart to beat faster (aerobic exercise) most days of the week. Activities may include walking, swimming, or biking.  Work with your health care provider or diet and nutrition specialist (dietitian) to adjust your eating plan to your  individual calorie needs. Reading food labels  Check food labels for the amount of sodium per serving. Choose foods with less than 5 percent of the Daily Value of sodium. Generally, foods with less than 300 mg of sodium per serving fit into this eating plan.  To find whole grains, look for the word "whole" as the first word in the ingredient list. Shopping  Buy products labeled as "low-sodium" or "no salt added."  Buy fresh foods. Avoid canned foods and premade or frozen meals. Cooking  Avoid adding salt when cooking. Use salt-free seasonings or herbs instead of table salt or sea salt. Check with your health care provider or pharmacist before using salt substitutes.  Do not fry foods. Cook foods using healthy methods such as baking, boiling, grilling, and broiling instead.  Cook with heart-healthy oils, such as olive, canola, soybean, or sunflower oil. Meal planning   Eat a balanced diet that includes: ? 5 or more servings of fruits and vegetables each day. At each meal, try to fill half of your plate with fruits and vegetables. ? Up to 6-8 servings of whole grains each day. ? Less than 6 oz of lean meat, poultry, or fish each day. A 3-oz serving of meat is about the same size as a deck of cards. One egg equals 1 oz. ? 2 servings of low-fat dairy each day. ? A serving of nuts, seeds, or beans 5 times each week. ? Heart-healthy fats. Healthy fats called Omega-3 fatty acids are found in foods such as flaxseeds and coldwater fish, like sardines,  salmon, and mackerel.  Limit how much you eat of the following: ? Canned or prepackaged foods. ? Food that is high in trans fat, such as fried foods. ? Food that is high in saturated fat, such as fatty meat. ? Sweets, desserts, sugary drinks, and other foods with added sugar. ? Full-fat dairy products.  Do not salt foods before eating.  Try to eat at least 2 vegetarian meals each week.  Eat more home-cooked food and less restaurant,  buffet, and fast food.  When eating at a restaurant, ask that your food be prepared with less salt or no salt, if possible. What foods are recommended? The items listed may not be a complete list. Talk with your dietitian about what dietary choices are best for you. Grains Whole-grain or whole-wheat bread. Whole-grain or whole-wheat pasta. Brown rice. [place]row. Bulgur. Whole-grain and low-sodium cereals. Pita bread. Low-fat, low-sodium crackers. Whole-wheat flour tortillas. Vegetables Fresh or frozen vegetables (raw, steamed, roasted, or grilled). Low-sodium or reduced-sodium tomato and vegetable juice. Low-sodium or reduced-sodium tomato sauce and tomato paste. Low-sodium or reduced-sodium canned vegetables. Fruits All fresh, dried, or frozen fruit. Canned fruit in natural juice (without added sugar). Meat and other protein foods Skinless chicken or [place]. Ground chicken or [place]. Pork with fat trimmed off. Fish and seafood. Egg whites. Dried beans, peas, or lentils. Unsalted nuts, nut butters, and seeds. Unsalted canned beans. Lean cuts of beef with fat trimmed off. Low-sodium, lean deli meat. Dairy Low-fat (1%) or fat-free (skim) milk. Fat-free, low-fat, or reduced-fat cheeses. Nonfat, low-sodium ricotta or cottage cheese. Low-fat or nonfat yogurt. Low-fat, low-sodium cheese. Fats and oils Soft margarine without trans fats. Vegetable oil. Low-fat, reduced-fat, or light mayonnaise and salad dressings (reduced-sodium). Canola, safflower, olive, soybean, and sunflower oils. Avocado. Seasoning and other foods Herbs. Spices. Seasoning mixes without salt. Unsalted popcorn and pretzels. Fat-free sweets. What foods are not recommended? The items listed may not be a complete list. Talk with your dietitian about what dietary choices are best for you. Grains Baked goods made with fat, such as croissants, muffins, or some breads. Dry pasta or rice meal packs. Vegetables Creamed or fried  vegetables. Vegetables in a cheese sauce. Regular canned vegetables (not low-sodium or reduced-sodium). Regular canned tomato sauce and paste (not low-sodium or reduced-sodium). Regular tomato and vegetable juice (not low-sodium or reduced-sodium). Angie Fava. Olives. Fruits Canned fruit in a light or heavy syrup. Fried fruit. Fruit in cream or butter sauce. Meat and other protein foods Fatty cuts of meat. Ribs. Fried meat. Berniece Salines. Sausage. Bologna and other processed lunch meats. Salami. Fatback. Hotdogs. Bratwurst. Salted nuts and seeds. Canned beans with added salt. Canned or smoked fish. Whole eggs or egg yolks. Chicken or [place] with skin. Dairy Whole or 2% milk, cream, and half-and-half. Whole or full-fat cream cheese. Whole-fat or sweetened yogurt. Full-fat cheese. Nondairy creamers. Whipped toppings. Processed cheese and cheese spreads. Fats and oils Butter. Stick margarine. Lard. Shortening. Ghee. Bacon fat. Tropical oils, such as coconut, palm kernel, or palm oil. Seasoning and other foods Salted popcorn and pretzels. Onion salt, garlic salt, seasoned salt, table salt, and sea salt. Worcestershire sauce. Tartar sauce. Barbecue sauce. Teriyaki sauce. Soy sauce, including reduced-sodium. Steak sauce. Canned and packaged gravies. Fish sauce. Oyster sauce. Cocktail sauce. Horseradish that you find on the shelf. Ketchup. Mustard. Meat flavorings and tenderizers. Bouillon cubes. Hot sauce and Tabasco sauce. Premade or packaged marinades. Premade or packaged taco seasonings. Relishes. Regular salad dressings. Where to find more information:  [employer]  Heart, Lung, and Blood Institute: PopSteam.is  American Heart Association: www.heart.org Summary  The DASH eating plan is a healthy eating plan that has been shown to reduce high blood pressure (hypertension). It may also reduce your risk for type 2 diabetes, heart disease, and stroke.  With the DASH eating plan, you should limit salt (sodium)  intake to 2,300 mg a day. If you have hypertension, you may need to reduce your sodium intake to 1,500 mg a day.  When on the DASH eating plan, aim to eat more fresh fruits and vegetables, whole grains, lean proteins, low-fat dairy, and heart-healthy fats.  Work with your health care provider or diet and nutrition specialist (dietitian) to adjust your eating plan to your individual calorie needs. This information is not intended to replace advice given to you by your health care provider. Make sure you discuss any questions you have with your health care provider. Document Released: 01/25/2011 Document Revised: 01/30/2016 Document Reviewed: 01/30/2016 Elsevier Interactive Patient Education  2017 [employer].   Please be sure medication list is accurate. If a new problem present, please set up appointment sooner than planned today.

## 2016-10-27 ENCOUNTER — Other Ambulatory Visit: Payer: Self-pay | Admitting: Family Medicine

## 2016-10-27 DIAGNOSIS — I10 Essential (primary) hypertension: Secondary | ICD-10-CM

## 2016-11-02 ENCOUNTER — Other Ambulatory Visit: Payer: Self-pay | Admitting: Family Medicine

## 2016-11-02 DIAGNOSIS — J309 Allergic rhinitis, unspecified: Secondary | ICD-10-CM

## 2016-11-26 ENCOUNTER — Other Ambulatory Visit: Payer: Self-pay | Admitting: Family Medicine

## 2016-11-26 DIAGNOSIS — I10 Essential (primary) hypertension: Secondary | ICD-10-CM

## 2016-12-02 ENCOUNTER — Emergency Department (HOSPITAL_COMMUNITY)
Admission: EM | Admit: 2016-12-02 | Discharge: 2016-12-02 | Disposition: A | Discharge status: Home / Self Care | Payer: Medicare Other | Attending: Emergency Medicine | Admitting: Emergency Medicine

## 2016-12-02 ENCOUNTER — Encounter (HOSPITAL_COMMUNITY): Payer: Self-pay | Admitting: Emergency Medicine

## 2016-12-02 ENCOUNTER — Emergency Department (HOSPITAL_COMMUNITY): Payer: Medicare Other

## 2016-12-02 DIAGNOSIS — I10 Essential (primary) hypertension: Secondary | ICD-10-CM | POA: Diagnosis not present

## 2016-12-02 DIAGNOSIS — Z79899 Other long term (current) drug therapy: Secondary | ICD-10-CM | POA: Insufficient documentation

## 2016-12-02 DIAGNOSIS — I674 Hypertensive encephalopathy: Secondary | ICD-10-CM | POA: Diagnosis not present

## 2016-12-02 DIAGNOSIS — M1990 Unspecified osteoarthritis, unspecified site: Secondary | ICD-10-CM

## 2016-12-02 DIAGNOSIS — Z7982 Long term (current) use of aspirin: Secondary | ICD-10-CM | POA: Insufficient documentation

## 2016-12-02 DIAGNOSIS — R519 Headache, unspecified: Secondary | ICD-10-CM

## 2016-12-02 DIAGNOSIS — R42 Dizziness and giddiness: Secondary | ICD-10-CM | POA: Insufficient documentation

## 2016-12-02 DIAGNOSIS — R51 Headache: Secondary | ICD-10-CM | POA: Diagnosis not present

## 2016-12-02 DIAGNOSIS — H539 Unspecified visual disturbance: Secondary | ICD-10-CM | POA: Diagnosis not present

## 2016-12-02 LAB — BASIC METABOLIC PANEL
Anion gap: 8 (ref 5–15)
BUN: 6 mg/dL (ref 6–20)
CO2: 29 mmol/L (ref 22–32)
Calcium: 8.9 mg/dL (ref 8.9–10.3)
Chloride: 104 mmol/L (ref 101–111)
Creatinine, Ser: 0.8 mg/dL (ref 0.44–1.00)
GFR calc Af Amer: >60 mL/min (ref >60)
GFR calc non Af Amer: >60 mL/min (ref >60)
Glucose, Bld: 106 mg/dL — ABNORMAL HIGH (ref 65–99)
Potassium: 3 mmol/L — ABNORMAL LOW (ref 3.5–5.1)
Sodium: 141 mmol/L (ref 135–145)

## 2016-12-02 LAB — CBC
HCT: 41.6 % (ref 36.0–46.0)
Hemoglobin: 13.1 g/dL (ref 12.0–15.0)
MCH: 25.5 pg — ABNORMAL LOW (ref 26.0–34.0)
MCHC: 31.5 g/dL (ref 30.0–36.0)
MCV: 81.1 fL (ref 78.0–100.0)
Platelets: 349 10*3/uL (ref 150–400)
RBC: 5.13 MIL/uL — ABNORMAL HIGH (ref 3.87–5.11)
RDW: 17.8 % — ABNORMAL HIGH (ref 11.5–15.5)
WBC: 7.4 10*3/uL (ref 4.0–10.5)

## 2016-12-02 LAB — DIFFERENTIAL
Basophils Absolute: 0 10*3/uL (ref 0.0–0.1)
Basophils Relative: 0 %
Eosinophils Absolute: 0.1 10*3/uL (ref 0.0–0.7)
Eosinophils Relative: 1 %
Lymphocytes Relative: 51 %
Lymphs Abs: 3.7 10*3/uL (ref 0.7–4.0)
Monocytes Absolute: 0.4 10*3/uL (ref 0.1–1.0)
Monocytes Relative: 5 %
Neutro Abs: 3.2 10*3/uL (ref 1.7–7.7)
Neutrophils Relative %: 43 %

## 2016-12-02 LAB — I-STAT TROPONIN, ED: Troponin i, poc: 0.03 ng/mL (ref 0.00–0.08)

## 2016-12-02 LAB — PROTIME-INR
INR: 0.94
Prothrombin Time: 12.5 seconds (ref 11.4–15.2)

## 2016-12-02 LAB — APTT: aPTT: 31 seconds (ref 24–36)

## 2016-12-02 MED ORDER — HYDROCHLOROTHIAZIDE 25 MG PO TABS
25.0000 mg | ORAL_TABLET | Freq: Every day | ORAL | Status: DC
  Administered 2016-12-02: 25 mg via ORAL
  Filled 2016-12-02: qty 1

## 2016-12-02 MED ORDER — LABETALOL HCL 5 MG/ML IV SOLN
10.0000 mg | Freq: Once | INTRAVENOUS | Status: AC
  Administered 2016-12-02: 10 mg via INTRAVENOUS
  Filled 2016-12-02: qty 4

## 2016-12-02 MED ORDER — GADOBENATE DIMEGLUMINE 529 MG/ML IV SOLN
19.0000 mL | Freq: Once | INTRAVENOUS | Status: AC | PRN
  Administered 2016-12-02: 20 mL via INTRAVENOUS

## 2016-12-02 MED ORDER — POTASSIUM CHLORIDE CRYS ER 20 MEQ PO TBCR
40.0000 meq | EXTENDED_RELEASE_TABLET | Freq: Once | ORAL | Status: AC
  Administered 2016-12-02: 40 meq via ORAL
  Filled 2016-12-02: qty 2

## 2016-12-02 MED ORDER — TRAMADOL HCL 50 MG PO TABS
50.0000 mg | ORAL_TABLET | Freq: Once | ORAL | Status: AC
Start: 2016-12-02 — End: 2016-12-02
  Administered 2016-12-02: 50 mg via ORAL
  Filled 2016-12-02: qty 1

## 2016-12-02 MED ORDER — HYDROCHLOROTHIAZIDE 25 MG PO TABS: 25.0000 mg | ORAL_TABLET | Freq: Every day | ORAL | 0 refills | Status: DC

## 2016-12-02 MED ORDER — POTASSIUM CHLORIDE ER 10 MEQ PO TBCR: 20.0000 meq | EXTENDED_RELEASE_TABLET | Freq: Two times a day (BID) | ORAL | 0 refills | Status: DC

## 2016-12-02 MED ORDER — AMLODIPINE BESYLATE 5 MG PO TABS
5.0000 mg | ORAL_TABLET | Freq: Once | ORAL | Status: DC
  Filled 2016-12-02: qty 1

## 2016-12-02 NOTE — ED Triage Notes (Addendum)
C/o sharp pain to top and back of head since last night.  Also reports double vision.  Pt states she has a history of htn and took BP medication today.  Reports chronic back pain that she believes may be making BP higher.  Pt also reports dizziness that started at 2pm today.  No arm drift or leg weakness noted.

## 2016-12-02 NOTE — ED Notes (Signed)
Pt reports high BP, headache, hip pain, and lower back pain x 2 days. Pt reports her back pain and hip pain is chronic but has been worse over the past few days. Pt reports she believes her high BP is due to being in severe pain. Pt A&Ox4. Pt reports 8/10 HA on the top and back of her head. Pt also reports vision changes that began at approx 2:45 PM today.

## 2016-12-02 NOTE — ED Notes (Signed)
ED Provider at bedside. 

## 2016-12-02 NOTE — ED Notes (Signed)
Spoke with Dr. Jeraldine Loots about pt and stroke work-up ordered.  Pt initially declined EKG.  Explained reasoning for test and pt agreeable.

## 2016-12-02 NOTE — ED Notes (Signed)
Pt ambulatory to restroom with steady gait. Per MRI, pt to be transported to scan in approx 1 hour. Pt notified to delay and verbalized understanding.

## 2016-12-02 NOTE — ED Notes (Signed)
Pt notified of delay for MRI. Pt verbalized understanding

## 2016-12-02 NOTE — ED Notes (Signed)
EDP aware of pt BP 

## 2016-12-02 NOTE — ED Notes (Signed)
Per MRI, will be approx another 90 mins before pt transported to scan.

## 2016-12-02 NOTE — ED Notes (Signed)
Per Darrel, EMT- lab called and said BMP was hemolyzed.

## 2016-12-02 NOTE — ED Provider Notes (Signed)
[facility]-EMERGENCY DEPT Provider Note   CSN: [id number] Arrival date & time: 12/02/16  1509     History   Chief Complaint Chief Complaint  Patient presents with  . Headache  . Diplopia    HPI Meghan Nichols is a 66 y.o. female.  HPI   Micah Flesher to go put church clothes at 245, and suddenly started to feel vertigo.  Was sitting in a chair and room started spinning around.  No trouble walking.  Had change in vision, felt like things looked darker, like it was 7pm  At night. Could see but not very well.  No double vision.   Had headache this morning prior to developing these symptoms. Now back and hip still working.  No dizziness or visual changes now.  NO numbness/weakness/facial droop/difficulty talking/no difficulty walking. Still having headache like a hammer just hitting. Have been taking blood pressure medications at home, normally is 120. On Thursday was higher. Stopped amlodipine one month ago due to leg swelling.  No chest pain or dyspnea.    Back pain and hip pain have been going on for years. Worse with cold weather. Takes tramadol sometimes.   Past Medical History:  Diagnosis Date  . High cholesterol   . Hypertension     Patient Active Problem List   Diagnosis Date Noted  . Class 1 obesity with body mass index (BMI) of 33.0 to 33.9 in adult 10/08/2016  . Chronic bilateral low back pain with bilateral sciatica 01/05/2016  . IFG (impaired fasting glucose) 01/02/2016  . Chronic back pain 01/02/2016  . Hypokalemia 08/28/2015  . Essential hypertension, benign 08/25/2015  . Hyperlipemia, mixed 08/25/2015  . Allergic rhinitis 08/25/2015  . Postmenopausal 04/09/2012    Past Surgical History:  Procedure Laterality Date  . ABDOMINAL HYSTERECTOMY      OB History    Gravida Para Term Preterm AB Living   SAB TAB Ectopic Multiple Live Births                   Home Medications    Prior to Admission medications   Medication Sig Start Date End Date  Taking? Authorizing Provider  Ascorbic Acid (VITAMIN C) 100 MG tablet Take 100 mg by mouth daily.   Yes [provider]  aspirin 81 MG tablet Take 81 mg by mouth daily.   Yes [provider]  azelastine (ASTELIN) 0.1 % nasal spray Place 2 sprays into both nostrils 2 (two) times daily. Use in each nostril as directed 03/02/16  Yes [place], Betty G, MD  cholecalciferol (VITAMIN D) 1000 UNITS tablet Take 1,000 Units by mouth daily.   Yes [provider]  fexofenadine (ALLEGRA ALLERGY) 180 MG tablet Take 1 tablet (180 mg total) by mouth daily. 07/09/16  Yes [place], Betty G, MD  fluticasone [facility]) 50 MCG/ACT nasal spray Place 1 spray into both nostrils 2 (two) times daily. 03/02/16  Yes [place], Betty G, MD  gabapentin (NEURONTIN) 100 MG capsule Take 2 capsules (200 mg total) by mouth at bedtime. 07/09/16  Yes [place], Betty G, MD  losartan (COZAAR) 100 MG tablet TAKE 1 TABLET(100 MG) BY MOUTH DAILY 07/30/16  Yes [place], Betty G, MD  metoprolol succinate (TOPROL-XL) 100 MG 24 hr tablet TAKE 1 TABLET BY MOUTH DAILY WITH OR IMMEDIATELY FOLLOWING A MEAL 11/26/16  Yes [place], Betty G, MD  traMADol (ULTRAM) 50 MG tablet Take 1 tablet (50 mg total) by mouth every 12 (twelve)  hours as needed. Patient taking differently: Take 50 mg by mouth every 12 (twelve) hours as needed for moderate pain.  07/09/16  Yes [place], Betty G, MD  VITAMIN E PO Take 1 tablet by mouth daily.   Yes [provider]  hydrochlorothiazide (HYDRODIURIL) 25 MG tablet Take 1 tablet (25 mg total) by mouth daily. 12/02/16 01/01/17  Alvira Monday, MD  potassium chloride (K-DUR) 10 MEQ tablet Take 2 tablets (20 mEq total) by mouth 2 (two) times daily. Take 4 tablets ( ) as your first dose tomorrow morning, followed by (2 tablets) at night then take 2 tablets twice a day. 12/02/16 12/07/16  Alvira Monday, MD    Family History Family History  Problem Relation Age of Onset  . Hypertension Mother   .  Hypertension Father   . Hypertension Sister   . Diabetes Sister   . Hypertension Maternal Grandmother   . Hypertension Maternal Grandfather   . Diabetes Sister     Social History Social History  Substance Use Topics  . Smoking status: Never Smoker  . Smokeless tobacco: Never Used  . Alcohol use No     Allergies   Penicillins   Review of Systems Review of Systems  Constitutional: Negative for fever.  HENT: Negative for sore throat.   Eyes: Positive for visual disturbance.  Respiratory: Negative for cough and shortness of breath.   Cardiovascular: Negative for chest pain.  Gastrointestinal: Negative for abdominal pain, nausea and vomiting.  Genitourinary: Negative for difficulty urinating.  Musculoskeletal: Negative for back pain and neck pain.  Skin: Negative for rash.  Neurological: Positive for dizziness and headaches. Negative for syncope, facial asymmetry, speech difficulty, weakness and numbness.     Physical Exam Updated Vital Signs BP (!) 188/95   Pulse 83   Temp 97.9 F (36.6 C)   Resp (!) 30   Ht  (1.626 m)   Wt 90.5 kg (199 lb 9.6 oz)   SpO2 98%   BMI 34.26 kg/m   Physical Exam  Constitutional: She is oriented to person, place, and time. She appears well-developed and well-nourished. No distress.  HENT:  Head: Normocephalic and atraumatic.  Eyes: Conjunctivae and EOM are normal.  Neck: Normal range of motion.  Cardiovascular: Normal rate, regular rhythm, normal heart sounds and intact distal pulses.  Exam reveals no gallop and no friction rub.   No murmur heard. Pulmonary/Chest: Effort normal and breath sounds normal. No respiratory distress. She has no wheezes. She has no rales.  Abdominal: Soft. She exhibits no distension. There is no tenderness. There is no guarding.  Musculoskeletal: She exhibits no edema or tenderness.  Neurological: She is alert and oriented to person, place, and time. She has normal strength. No cranial nerve deficit or  sensory deficit. She displays a negative Romberg sign. Coordination and gait normal. GCS eye subscore is 4. GCS verbal subscore is 5. GCS motor subscore is 6.  Skin: Skin is warm and dry. No rash noted. She is not diaphoretic. No erythema.  Nursing note and vitals reviewed.    ED Treatments / Results  Labs (all labs ordered are listed, but only abnormal results are displayed) Labs Reviewed  CBC - Abnormal; Notable for the following:       Result Value   RBC 5.13 (*)    MCH 25.5 (*)    RDW 17.8 (*)    All other components within normal limits  BASIC METABOLIC PANEL - Abnormal; Notable for the following:    Potassium  3.0 (*)    Glucose, Bld 106 (*)    All other components within normal limits  PROTIME-INR  APTT  DIFFERENTIAL  I-STAT TROPONIN, ED    EKG  EKG Interpretation  Date/Time:  Sunday December 02 2016 15:39:09 EDT Ventricular Rate:  87 PR Interval:  140 QRS Duration: 80 QT Interval:  384 QTC Calculation: 462 R Axis:   23 Text Interpretation:  Normal sinus rhythm ST-t wave abnormality Artifact Abnormal ekg Confirmed by Gerhard Munch [phone]) on 12/02/2016 3:41:24 PM       Radiology Ct Head Wo Contrast  Result Date: 12/02/2016 CLINICAL DATA:  Worsening headache. EXAM: CT HEAD WITHOUT CONTRAST TECHNIQUE: Contiguous axial images were obtained from the base of the skull through the vertex without intravenous contrast. COMPARISON:  08/15/2007 FINDINGS: Brain: No evidence of acute infarction, hemorrhage, hydrocephalus, extra-axial collection or mass lesion/mass effect. Mild periventricular microangiopathy. Vascular: Calcific atherosclerotic disease at the skullbase. Skull: Normal. Negative for fracture or focal lesion. Sinuses/Orbits: No acute finding. Other: None. IMPRESSION: No acute intracranial abnormality. Mild periventricular chronic microvascular disease. Electronically Signed   By: Ted Mcalpine M.D.   On: 12/02/2016 16:15   Mr Shirlee Latch RU Contrast  Result  Date: 12/02/2016 CLINICAL DATA:  66 year old female with persistent headache and vertigo since this morning. EXAM: MRI HEAD WITHOUT CONTRAST MRA HEAD WITHOUT CONTRAST MRA NECK WITHOUT AND WITH CONTRAST TECHNIQUE: Multiplanar, multiecho pulse sequences of the brain and surrounding structures were obtained without intravenous contrast. Angiographic images of the Circle of Willis were obtained using MRA technique without intravenous contrast. Angiographic images of the neck were obtained using MRA technique without and with intravenous contrast. Carotid stenosis measurements (when applicable) are obtained utilizing NASCET criteria, using the distal internal carotid diameter as the denominator. CONTRAST:  20mL MULTIHANCE GADOBENATE DIMEGLUMINE 529 MG/ML IV SOLN COMPARISON:  Head CT1603 hours today FINDINGS: MRI HEAD FINDINGS Brain: No restricted diffusion to suggest acute infarction. No midline shift, mass effect, evidence of mass lesion, ventriculomegaly, extra-axial collection or acute intracranial hemorrhage. Cervicomedullary junction and pituitary are within normal limits. Patchy periventricular and other scattered cerebral white matter T2 and FLAIR hyperintensity in a nonspecific configuration. Superimposed mild T2 heterogeneity throughout the deep gray matter nuclei, including a probable chronic lacunar infarct of the left caudate (series 13, image 28). No chronic cerebral blood products. No cortical encephalomalacia. The brainstem and cerebellum appear normal. Vascular: Major intracranial vascular flow voids are preserved. Skull and upper cervical spine: Multilevel cervical spine degeneration with up to mild degenerative spinal stenosis (sagittal image 13). Normal bone marrow signal in the skull. Sinuses/Orbits: Normal orbits soft tissues. Visualized paranasal sinuses and mastoids are stable and well pneumatized. Other: Visible internal auditory structures appear normal. Scalp and face soft tissues appear  negative. MRA NECK FINDINGS Precontrast time-of-flight images reveal antegrade flow in both carotid and vertebral arteries in the neck. Post-contrast neck MRA images reveal a 3 vessel arch configuration. No proximal great vessel stenosis. Right CCA and cervical right ICA are negative aside from tortuosity. Widely patent right carotid bifurcation. Left CCA and cervical left ICA are negative aside from tortuosity. Widely patent left carotid bifurcation. No proximal subclavian artery stenosis. Both vertebral artery origins appear normal. Tortuous bilateral V1 segments. Tortuous left V 2 segment. No vertebral artery stenosis to the skullbase. MRA HEAD FINDINGS Antegrade flow in the posterior circulation with codominant distal vertebral arteries. Patent PICA origins and vertebrobasilar junction. No basilar stenosis. AICA, SCA and PCA origins are normal. The posterior communicating arteries  are diminutive. Bilateral PCA branches are normal. Antegrade flow in both ICA siphons. No siphon stenosis. Normal ophthalmic and posterior communicating artery origins. Normal carotid termini, MCA and ACA origins. The left A1 segment is dominant. Anterior communicating artery and visible ACA branches are normal. Bilateral MCA M1 segments and visible bilateral MCA branches are normal. IMPRESSION: 1.  No acute intracranial abnormality. 2. Mild to moderate for age signal changes in the cerebral white matter and deep gray matter nuclei most commonly due to chronic small vessel disease. 3. Negative neck MRA aside from carotid and vertebral tortuosity. 4.  Negative intracranial MRA. Electronically Signed   By: Odessa Fleming M.D.   On: 12/02/2016 21:53   Mr Angiogram Neck W Or Wo Contrast  Result Date: 12/02/2016 CLINICAL DATA:  66 year old female with persistent headache and vertigo since this morning. EXAM: MRI HEAD WITHOUT CONTRAST MRA HEAD WITHOUT CONTRAST MRA NECK WITHOUT AND WITH CONTRAST TECHNIQUE: Multiplanar, multiecho pulse sequences  of the brain and surrounding structures were obtained without intravenous contrast. Angiographic images of the Circle of Willis were obtained using MRA technique without intravenous contrast. Angiographic images of the neck were obtained using MRA technique without and with intravenous contrast. Carotid stenosis measurements (when applicable) are obtained utilizing NASCET criteria, using the distal internal carotid diameter as the denominator. CONTRAST:  20mL MULTIHANCE GADOBENATE DIMEGLUMINE 529 MG/ML IV SOLN COMPARISON:  Head CT1603 hours today FINDINGS: MRI HEAD FINDINGS Brain: No restricted diffusion to suggest acute infarction. No midline shift, mass effect, evidence of mass lesion, ventriculomegaly, extra-axial collection or acute intracranial hemorrhage. Cervicomedullary junction and pituitary are within normal limits. Patchy periventricular and other scattered cerebral white matter T2 and FLAIR hyperintensity in a nonspecific configuration. Superimposed mild T2 heterogeneity throughout the deep gray matter nuclei, including a probable chronic lacunar infarct of the left caudate (series 13, image 28). No chronic cerebral blood products. No cortical encephalomalacia. The brainstem and cerebellum appear normal. Vascular: Major intracranial vascular flow voids are preserved. Skull and upper cervical spine: Multilevel cervical spine degeneration with up to mild degenerative spinal stenosis (sagittal image 13). Normal bone marrow signal in the skull. Sinuses/Orbits: Normal orbits soft tissues. Visualized paranasal sinuses and mastoids are stable and well pneumatized. Other: Visible internal auditory structures appear normal. Scalp and face soft tissues appear negative. MRA NECK FINDINGS Precontrast time-of-flight images reveal antegrade flow in both carotid and vertebral arteries in the neck. Post-contrast neck MRA images reveal a 3 vessel arch configuration. No proximal great vessel stenosis. Right CCA and  cervical right ICA are negative aside from tortuosity. Widely patent right carotid bifurcation. Left CCA and cervical left ICA are negative aside from tortuosity. Widely patent left carotid bifurcation. No proximal subclavian artery stenosis. Both vertebral artery origins appear normal. Tortuous bilateral V1 segments. Tortuous left V 2 segment. No vertebral artery stenosis to the skullbase. MRA HEAD FINDINGS Antegrade flow in the posterior circulation with codominant distal vertebral arteries. Patent PICA origins and vertebrobasilar junction. No basilar stenosis. AICA, SCA and PCA origins are normal. The posterior communicating arteries are diminutive. Bilateral PCA branches are normal. Antegrade flow in both ICA siphons. No siphon stenosis. Normal ophthalmic and posterior communicating artery origins. Normal carotid termini, MCA and ACA origins. The left A1 segment is dominant. Anterior communicating artery and visible ACA branches are normal. Bilateral MCA M1 segments and visible bilateral MCA branches are normal. IMPRESSION: 1.  No acute intracranial abnormality. 2. Mild to moderate for age signal changes in the cerebral white matter and  deep gray matter nuclei most commonly due to chronic small vessel disease. 3. Negative neck MRA aside from carotid and vertebral tortuosity. 4.  Negative intracranial MRA. Electronically Signed   By: Odessa Fleming M.D.   On: 12/02/2016 21:53   Mr Brain Wo Contrast  Result Date: 12/02/2016 CLINICAL DATA:  65 year old female with persistent headache and vertigo since this morning. EXAM: MRI HEAD WITHOUT CONTRAST MRA HEAD WITHOUT CONTRAST MRA NECK WITHOUT AND WITH CONTRAST TECHNIQUE: Multiplanar, multiecho pulse sequences of the brain and surrounding structures were obtained without intravenous contrast. Angiographic images of the Circle of Willis were obtained using MRA technique without intravenous contrast. Angiographic images of the neck were obtained using MRA technique without  and with intravenous contrast. Carotid stenosis measurements (when applicable) are obtained utilizing NASCET criteria, using the distal internal carotid diameter as the denominator. CONTRAST:  20mL MULTIHANCE GADOBENATE DIMEGLUMINE 529 MG/ML IV SOLN COMPARISON:  Head CT1603 hours today FINDINGS: MRI HEAD FINDINGS Brain: No restricted diffusion to suggest acute infarction. No midline shift, mass effect, evidence of mass lesion, ventriculomegaly, extra-axial collection or acute intracranial hemorrhage. Cervicomedullary junction and pituitary are within normal limits. Patchy periventricular and other scattered cerebral white matter T2 and FLAIR hyperintensity in a nonspecific configuration. Superimposed mild T2 heterogeneity throughout the deep gray matter nuclei, including a probable chronic lacunar infarct of the left caudate (series 13, image 28). No chronic cerebral blood products. No cortical encephalomalacia. The brainstem and cerebellum appear normal. Vascular: Major intracranial vascular flow voids are preserved. Skull and upper cervical spine: Multilevel cervical spine degeneration with up to mild degenerative spinal stenosis (sagittal image 13). Normal bone marrow signal in the skull. Sinuses/Orbits: Normal orbits soft tissues. Visualized paranasal sinuses and mastoids are stable and well pneumatized. Other: Visible internal auditory structures appear normal. Scalp and face soft tissues appear negative. MRA NECK FINDINGS Precontrast time-of-flight images reveal antegrade flow in both carotid and vertebral arteries in the neck. Post-contrast neck MRA images reveal a 3 vessel arch configuration. No proximal great vessel stenosis. Right CCA and cervical right ICA are negative aside from tortuosity. Widely patent right carotid bifurcation. Left CCA and cervical left ICA are negative aside from tortuosity. Widely patent left carotid bifurcation. No proximal subclavian artery stenosis. Both vertebral artery origins  appear normal. Tortuous bilateral V1 segments. Tortuous left V 2 segment. No vertebral artery stenosis to the skullbase. MRA HEAD FINDINGS Antegrade flow in the posterior circulation with codominant distal vertebral arteries. Patent PICA origins and vertebrobasilar junction. No basilar stenosis. AICA, SCA and PCA origins are normal. The posterior communicating arteries are diminutive. Bilateral PCA branches are normal. Antegrade flow in both ICA siphons. No siphon stenosis. Normal ophthalmic and posterior communicating artery origins. Normal carotid termini, MCA and ACA origins. The left A1 segment is dominant. Anterior communicating artery and visible ACA branches are normal. Bilateral MCA M1 segments and visible bilateral MCA branches are normal. IMPRESSION: 1.  No acute intracranial abnormality. 2. Mild to moderate for age signal changes in the cerebral white matter and deep gray matter nuclei most commonly due to chronic small vessel disease. 3. Negative neck MRA aside from carotid and vertebral tortuosity. 4.  Negative intracranial MRA. Electronically Signed   By: Odessa Fleming M.D.   On: 12/02/2016 21:53    Procedures Procedures (including critical care time)  Medications Ordered in ED Medications  labetalol (NORMODYNE,TRANDATE) injection 10 mg (10 mg Intravenous Given 12/02/16 1701)  traMADol (ULTRAM) tablet 50 mg (50 mg Oral Given 12/02/16 1749)  potassium chloride SA (K-DUR,KLOR-CON) CR tablet 40 mEq (40 mEq Oral Given 12/02/16 1908)  gadobenate dimeglumine (MULTIHANCE) injection 19 mL (20 mLs Intravenous Contrast Given 12/02/16 2135)     Initial Impression / Assessment and Plan / ED Course  I have reviewed the triage vital signs and the nursing notes.  Pertinent labs & imaging results that were available during my care of the patient were reviewed by me and considered in my medical decision making (see chart for details).     66yo female with history above presents with concern for  headache, vertigo, visual changes.  Blood pressure on arrival 220s/130s.  On my evaluation, she denies continuing neurologic symptoms and has a normal neurologic exam.  CT head WNL.  Given labetalol for blood pressure with improvement to 180s.  GIven oral hctz.  Discussed patient with neurology, TIA vs htn emergency, and given elevation in pressures with transient symptoms feel htn likely etiology and do not feel admission for TIA indicated if MR normal.  MR brain, MRA head and neck without significant changes. Given rx for hctz and potassium. Discussed need for close PCP follow up and reasons to return in detail. Patient discharged in stable condition with understanding of reasons to return.   Final Clinical Impressions(s) / ED Diagnoses   Final diagnoses:  Essential hypertension  Hypertensive encephalopathy, resolved  Acute nonintractable headache, unspecified headache type  Arthritis    New Prescriptions Discharge Medication List as of 12/02/2016 10:18 PM    START taking these medications   Details  hydrochlorothiazide (HYDRODIURIL) 25 MG tablet Take 1 tablet (25 mg total) by mouth daily., Starting Sun 12/02/2016, Until Tue 01/01/2017, Print    potassium chloride (K-DUR) 10 MEQ tablet Take 2 tablets (20 mEq total) by mouth 2 (two) times daily. Take 4 tablets ( ) as your first dose tomorrow morning, followed by (2 tablets) at night then take 2 tablets twice a day., Starting Sun 12/02/2016, Until Fri 12/07/2016, Print         Alvira Monday, MD 12/03/16 [phone]

## 2016-12-02 NOTE — ED Notes (Signed)
Pt reports she no longer takes her prescribed amlodipine due to peripheral swelling. Pt reports her PCP took her off. Pt states she took both her prescribed losartan and metoprolol today as ordered. BP remains elevated at approx 183/111

## 2016-12-02 NOTE — ED Notes (Signed)
Patient transported to MRI 

## 2016-12-02 NOTE — ED Notes (Signed)
Stroke swallow screen performed at approx 1733-1735 prior to administration of ordered oral medications. Pt passed swallow screen prior to being given PO meds.

## 2016-12-03 DIAGNOSIS — Z1231 Encounter for screening mammogram for malignant neoplasm of breast: Secondary | ICD-10-CM | POA: Diagnosis not present

## 2016-12-03 LAB — HM MAMMOGRAPHY

## 2016-12-04 ENCOUNTER — Encounter: Payer: Self-pay | Admitting: Family Medicine

## 2016-12-16 NOTE — Progress Notes (Signed)
HPI:   [name] is a 66 y.o. female, who is here today to follow on recent ER visit.  She was last seen on 10/08/16.  She was in the ER, 12/02/16, due to headache and diplopia (per records), she denies the latter one. Headache has resolved.   12/02/16 brain MRI/MRA:  1.  No acute intracranial abnormality. 2. Mild to moderate for age signal changes in the cerebral white matter and deep gray matter nuclei most commonly due to chronic small vessel disease. 3. Negative neck MRA aside from carotid and vertebral tortuosity. 4.  Negative intracranial MRA.   Hypertension:   Currently on Cozaar 100 mg, Metoprolol Succinate 100 mg daily. HCTZ 25 mg added in the ER because elevated BP at 188/95.  Home BP's: 120's/70's and a few 140's/90's. Last eye exam:2-3 years ago  She is taking medications as instructed, no side effects reported.  She has not noted exertional chest pain, dyspnea,  focal weakness, or edema.  HypoK+, she was on HCTZ before,discontinue due to persistent low K+. She is on K-DUR 10 meq bid.   Lab Results  Component Value Date   CREATININE 0.80 12/02/2016   BUN 6 12/02/2016   NA 141 12/02/2016   K 3.0 (L) 12/02/2016   CL 104 12/02/2016   CO2 29 12/02/2016     Back pain: Chronic. Radiated to LE's, burning and numbness on lateral aspect of thigh, intermittently. She is on Gabapentin 200 mg at bedtime.  Pain is exacerbated by movement, prolonged standing,walking, and cold weather. Alleviated by rest, local ice, and Tramadol.  Pain is 10/10.  She takes Tramadol as needed, usually she takes Tramadol if pain "is affecting" her BP. Medication causes drowsiness.  2006 Lumbar CT: 1.  No appreciable change since last December. 2.  Minor disc bulge at L2-3 without herniation or stenosis.   3.  L3-4:  Disc bulge with prominence in the left foraminal to extraforaminal region.  The L-3 nerve root appears to exit freely but could conceivably be  irritated in the extraforaminal region.  4.  Minor left foraminal to extraforaminal disc bulge at L4-5 without apparent neural compression.  5.  Shallow disc protrusion at L5-S1 eccentrically more prominent towards the left but without apparent neural compression.    Review of Systems  Constitutional: Negative for activity change, appetite change, fatigue and fever.  HENT: Negative for mouth sores, nosebleeds and trouble swallowing.   Eyes: Negative for redness and visual disturbance.  Respiratory: Negative for cough, shortness of breath and wheezing.   Cardiovascular: Negative for chest pain, palpitations and leg swelling.  Gastrointestinal: Negative for abdominal pain, nausea and vomiting.       Negative for changes in bowel habits.  Genitourinary: Negative for decreased urine volume, dysuria and hematuria.  Musculoskeletal: Positive for back pain and gait problem.  Skin: Negative for pallor and rash.  Neurological: Positive for numbness (stable). Negative for syncope, weakness and headaches.  Psychiatric/Behavioral: Negative for confusion. The patient is nervous/anxious.     Current Outpatient Prescriptions on File Prior to Visit  Medication Sig Dispense Refill  . Ascorbic Acid (VITAMIN C) 100 MG tablet Take 100 mg by mouth daily.    Marland Kitchen. aspirin 81 MG tablet Take 81 mg by mouth daily.    Marland Kitchen. azelastine (ASTELIN) 0.1 % nasal spray Place 2 sprays into both nostrils 2 (two) times daily. Use in each nostril as directed 30 mL 3  . cholecalciferol (VITAMIN D) 1000 UNITS tablet Take  1,000 Units by mouth daily.    . fexofenadine (ALLEGRA ALLERGY) 180 MG tablet Take 1 tablet (180 mg total) by mouth daily. 90 tablet 3  . fluticasone (FLONASE) 50 MCG/ACT nasal spray Place 1 spray into both nostrils 2 (two) times daily. 16 g 3  . gabapentin (NEURONTIN) 100 MG capsule Take 2 capsules (200 mg total) by mouth at bedtime. 180 capsule 1  . hydrochlorothiazide (HYDRODIURIL) 25 MG tablet Take 1 tablet (25  mg total) by mouth daily. 30 tablet 0  . losartan (COZAAR) 100 MG tablet TAKE 1 TABLET(100 MG) BY MOUTH DAILY 90 tablet 1  . metoprolol succinate (TOPROL-XL) 100 MG 24 hr tablet TAKE 1 TABLET BY MOUTH DAILY WITH OR IMMEDIATELY FOLLOWING A MEAL 90 tablet 1  . VITAMIN E PO Take 1 tablet by mouth daily.    . potassium chloride (K-DUR) 10 MEQ tablet Take 2 tablets (20 mEq total) by mouth 2 (two) times daily. Take 4 tablets ( ) as your first dose tomorrow morning, followed by (2 tablets) at night then take 2 tablets twice a day. 22 tablet 0   No current facility-administered medications on file prior to visit.      Past Medical History:  Diagnosis Date  . High cholesterol   . Hypertension    Allergies  Allergen Reactions  . Penicillins Hives    Has patient had a PCN reaction causing immediate rash, facial/tongue/throat swelling, SOB or lightheadedness with hypotension: Yes Has patient had a PCN reaction causing severe rash involving mucus membranes or skin necrosis: Unknown Has patient had a PCN reaction that required hospitalization: No Has patient had a PCN reaction occurring within the last 10 years: No If all of the above answers are "NO", then may proceed with Cephalosporin use.     Social History   Social History  . Marital status: Married    Spouse name: N/A  . Number of children: N/A  . Years of education: N/A   Social History Main Topics  . Smoking status: Never Smoker  . Smokeless tobacco: Never Used  . Alcohol use No  . Drug use: No  . Sexual activity: No   Other Topics Concern  . None   Social History Narrative  . None    Vitals:   12/17/16 1007  BP: 140/82  Pulse: 89  Temp: 98.2 F (36.8 C)  SpO2: 96%   Body mass index is 34.07 kg/m.  Physical Exam  Nursing note and vitals reviewed. Constitutional: She is oriented to person, place, and time. She appears well-developed. No distress.  HENT:  Head: Normocephalic and atraumatic.    Mouth/Throat: Oropharynx is clear and moist and mucous membranes are normal.  Eyes: Pupils are equal, round, and reactive to light. Conjunctivae are normal.  Cardiovascular: Normal rate and regular rhythm.   No murmur heard. Pulses:      Dorsalis pedis pulses are 2+ on the right side, and 2+ on the left side.  Respiratory: Effort normal and breath sounds normal. No respiratory distress.  GI: Soft. She exhibits no mass. There is no hepatomegaly. There is no tenderness.  Musculoskeletal: She exhibits no edema.       Thoracic back: She exhibits no tenderness and no bony tenderness.       Lumbar back: She exhibits no tenderness and no bony tenderness.  Lymphadenopathy:    She has no cervical adenopathy.  Neurological: She is alert and oriented to person, place, and time. She has normal strength. Coordination normal.  Reflex Scores:      Patellar reflexes are 2+ on the right side and 2+ on the left side. SLR negative bilateral. Gait assisted with cane.  Skin: Skin is warm. No rash noted. No erythema.  Psychiatric: Her mood appears anxious.  Well groomed, good eye contact.     ASSESSMENT AND PLAN:   Ms Meghan Nichols was seen today for follow-up.  Diagnoses and all orders for this visit:  Lab Results  Component Value Date   CREATININE 0.81 12/17/2016   BUN 13 12/17/2016   NA 143 12/17/2016   K 3.2 (L) 12/17/2016   CL 101 12/17/2016   CO2 32 12/17/2016    Essential hypertension, benign  Better controlled. No changes in current management.  DASH diet recommended. Eye exam recommended annually. F/U in 3 months, before if needed.  -     Basic metabolic panel -     Aldosterone + renin activity w/ ratio  Chronic bilateral low back pain with bilateral sciatica  Stable. No changes in current management,side effects of Tramadol discussed.  F/U in 3-4  months.   -     traMADol (ULTRAM) 50 MG tablet; Take 1 tablet (50 mg total) by mouth every 12 (twelve) hours as  needed.  Hypokalemia  HTN has ben difficult to control + persistent hypoK+, so aldosterone and renin with ratio ordered. Further recommendations will be given according to lab results. May change HCTZ for Spironolactone.  -     Basic metabolic panel -     Aldosterone + renin activity w/ ratio  Headache, unspecified headache type  Resolved. Instructed about warning signs.     -[name] was advised to return sooner than planned today if new concerns arise.       Rosana Farnell G. [place], MD  [facility]. [place] office.

## 2016-12-17 ENCOUNTER — Ambulatory Visit (INDEPENDENT_AMBULATORY_CARE_PROVIDER_SITE_OTHER): Payer: Medicare Other | Admitting: Family Medicine

## 2016-12-17 ENCOUNTER — Encounter: Payer: Self-pay | Admitting: Family Medicine

## 2016-12-17 VITALS — BP 140/82 | HR 89 | Temp 98.2°F | Ht 64.0 in | Wt 198.5 lb

## 2016-12-17 DIAGNOSIS — E876 Hypokalemia: Secondary | ICD-10-CM

## 2016-12-17 DIAGNOSIS — M5441 Lumbago with sciatica, right side: Secondary | ICD-10-CM | POA: Diagnosis not present

## 2016-12-17 DIAGNOSIS — G8929 Other chronic pain: Secondary | ICD-10-CM | POA: Diagnosis not present

## 2016-12-17 DIAGNOSIS — I10 Essential (primary) hypertension: Secondary | ICD-10-CM | POA: Diagnosis not present

## 2016-12-17 DIAGNOSIS — R51 Headache: Secondary | ICD-10-CM

## 2016-12-17 DIAGNOSIS — M5442 Lumbago with sciatica, left side: Secondary | ICD-10-CM | POA: Diagnosis not present

## 2016-12-17 DIAGNOSIS — R519 Headache, unspecified: Secondary | ICD-10-CM

## 2016-12-17 LAB — BASIC METABOLIC PANEL
BUN: 13 mg/dL (ref 6–23)
CO2: 32 mEq/L (ref 19–32)
Calcium: 9.6 mg/dL (ref 8.4–10.5)
Chloride: 101 mEq/L (ref 96–112)
Creatinine, Ser: 0.81 mg/dL (ref 0.40–1.20)
GFR: 91.41 mL/min (ref >60.00)
Glucose, Bld: 94 mg/dL (ref 70–99)
Potassium: 3.2 mEq/L — ABNORMAL LOW (ref 3.5–5.1)
Sodium: 143 mEq/L (ref 135–145)

## 2016-12-17 MED ORDER — TRAMADOL HCL 50 MG PO TABS: 50.0000 mg | ORAL_TABLET | Freq: Two times a day (BID) | ORAL | 2 refills | Status: DC | PRN

## 2016-12-17 NOTE — Patient Instructions (Signed)
A few things to remember from today's visit:   Essential hypertension, benign - Plan: Basic metabolic panel, Aldosterone + renin activity w/ ratio  Chronic bilateral low back pain with bilateral sciatica - Plan: traMADol (ULTRAM) 50 MG tablet  Hypokalemia - Plan: Basic metabolic panel, Aldosterone + renin activity w/ ratio  No changes for now, will wait for lab to decide about potassium and fluid pill.   Please be sure medication list is accurate. If a new problem present, please set up appointment sooner than planned today.

## 2016-12-25 LAB — ALDOSTERONE + RENIN ACTIVITY W/ RATIO
ALDO / PRA Ratio: 100 Ratio — ABNORMAL HIGH (ref 0.9–28.9)
Aldosterone, Serum: 6 ng/dL
Renin Activity: 0.06 ng/mL/h — ABNORMAL LOW (ref 0.25–5.82)

## 2016-12-31 ENCOUNTER — Other Ambulatory Visit: Payer: Self-pay | Admitting: Family Medicine

## 2016-12-31 DIAGNOSIS — M5441 Lumbago with sciatica, right side: Principal | ICD-10-CM

## 2016-12-31 DIAGNOSIS — M5442 Lumbago with sciatica, left side: Principal | ICD-10-CM

## 2016-12-31 DIAGNOSIS — G8929 Other chronic pain: Secondary | ICD-10-CM

## 2017-01-01 ENCOUNTER — Other Ambulatory Visit: Payer: Self-pay | Admitting: Family Medicine

## 2017-01-01 MED ORDER — POTASSIUM CHLORIDE ER 10 MEQ PO TBCR: 10.0000 meq | EXTENDED_RELEASE_TABLET | Freq: Two times a day (BID) | ORAL | 2 refills | Status: DC

## 2017-01-31 ENCOUNTER — Other Ambulatory Visit: Payer: Self-pay | Admitting: Family Medicine

## 2017-01-31 DIAGNOSIS — I10 Essential (primary) hypertension: Secondary | ICD-10-CM

## 2017-03-04 ENCOUNTER — Ambulatory Visit (INDEPENDENT_AMBULATORY_CARE_PROVIDER_SITE_OTHER): Payer: Medicare Other | Admitting: Family Medicine

## 2017-03-04 ENCOUNTER — Encounter: Payer: Self-pay | Admitting: Family Medicine

## 2017-03-04 VITALS — BP 130/77 | HR 88 | Temp 98.2°F | Resp 12 | Ht 64.0 in | Wt 194.2 lb

## 2017-03-04 DIAGNOSIS — M5441 Lumbago with sciatica, right side: Secondary | ICD-10-CM

## 2017-03-04 DIAGNOSIS — R7303 Prediabetes: Secondary | ICD-10-CM | POA: Insufficient documentation

## 2017-03-04 DIAGNOSIS — G8929 Other chronic pain: Secondary | ICD-10-CM

## 2017-03-04 DIAGNOSIS — M5442 Lumbago with sciatica, left side: Secondary | ICD-10-CM | POA: Diagnosis not present

## 2017-03-04 DIAGNOSIS — I10 Essential (primary) hypertension: Secondary | ICD-10-CM | POA: Diagnosis not present

## 2017-03-04 DIAGNOSIS — J309 Allergic rhinitis, unspecified: Secondary | ICD-10-CM

## 2017-03-04 DIAGNOSIS — E876 Hypokalemia: Secondary | ICD-10-CM | POA: Diagnosis not present

## 2017-03-04 LAB — BASIC METABOLIC PANEL
BUN: 14 mg/dL (ref 6–23)
CO2: 30 mEq/L (ref 19–32)
Calcium: 9.5 mg/dL (ref 8.4–10.5)
Chloride: 103 mEq/L (ref 96–112)
Creatinine, Ser: 0.91 mg/dL (ref 0.40–1.20)
GFR: 79.87 mL/min (ref >60.00)
Glucose, Bld: 116 mg/dL — ABNORMAL HIGH (ref 70–99)
Potassium: 3.1 mEq/L — ABNORMAL LOW (ref 3.5–5.1)
Sodium: 144 mEq/L (ref 135–145)

## 2017-03-04 LAB — HEMOGLOBIN A1C: Hgb A1c MFr Bld: 6.4 % (ref 4.6–6.5)

## 2017-03-04 MED ORDER — METOPROLOL SUCCINATE ER 100 MG PO TB24: ORAL_TABLET | ORAL | 1 refills | Status: DC

## 2017-03-04 MED ORDER — PREDNISONE 20 MG PO TABS: 40.0000 mg | ORAL_TABLET | Freq: Every day | ORAL | 0 refills | Status: AC

## 2017-03-04 NOTE — Patient Instructions (Addendum)
A few things to remember from today's visit:   Hypokalemia - Plan: Basic metabolic panel  Essential hypertension, benign - Plan: metoprolol succinate (TOPROL-XL) 100 MG 24 hr tablet, Basic metabolic panel  Chronic bilateral low back pain with bilateral sciatica  Allergic rhinitis, unspecified seasonality, unspecified trigger - Plan: Ambulatory referral to ENT, predniSONE (DELTASONE) 20 MG tablet   Please be sure medication list is accurate. If a new problem present, please set up appointment sooner than planned today.

## 2017-03-04 NOTE — Progress Notes (Signed)
HPI:   [name] is a 67 y.o. female, who is here today for 5 months follow up.   She was last seen on 12/17/2016.  Chronic back pain:  With radiation to LE's: Burning sensation and numbness on the lateral aspect of thigh, intermittently. Pain is exacerbated by certain movements, prolonged standing, and prolonged walking. It is alleviated by changing positions and by rest.  Problem is overall stable.  Gabapentin 2-3 times per week because next day drowsiness.  She is taking Tramadol 50 mg as needed, last week she took it more often due to cold weather, which makes pain worse.  -Today she is requesting referral to ENT.  For the past 2 weeks she has had intermittent earache and frontal pressure headache and toothache. No fever,chills,or sick contact. +Nasal congestion. History of allergic rhinitis, she does continue Allegra because it was aggravating headaches. She is on Flonase nasal spray and Astelin nasal spray.  Eye exam scheduled in 03/2017. Flonase nasal spray helps temporarily.   She was having "migraine" headaches more often while she was on Allegra. She has not had a migraine headache in 3 weeks.  Hypertension: Currently she is on Losartan 100 mg daily and Metoprolol Succinate 100 mg daily. Hypokalemia, we discontinued HCTZ.  Currently she is on K-Dur 10 mEq daily.   Lab Results  Component Value Date   CREATININE 0.81 12/17/2016   BUN 13 12/17/2016   NA 143 12/17/2016   K 3.2 (L) 12/17/2016   CL 101 12/17/2016   CO2 32 12/17/2016    Denies visual changes, chest pain, dyspnea, palpitation, claudication, focal weakness, or edema.  Prediabetes: Last HgA1c 6.2 in 08/2015. Denies abdominal pain, nausea,vomiting, polydipsia,polyuria, or polyphagia.   Review of Systems  Constitutional: Negative for activity change, appetite change, fatigue, fever and unexpected weight change.  HENT: Positive for congestion, postnasal drip, rhinorrhea and sinus  pressure. Negative for mouth sores, nosebleeds, sore throat and trouble swallowing.   Eyes: Negative for redness and visual disturbance.  Respiratory: Negative for cough, shortness of breath and wheezing.   Cardiovascular: Negative for chest pain, palpitations and leg swelling.  Gastrointestinal: Negative for abdominal pain, nausea and vomiting.       Negative for changes in bowel habits.  Endocrine: Negative for cold intolerance, heat intolerance, polydipsia, polyphagia and polyuria.  Genitourinary: Negative for decreased urine volume, dysuria and hematuria.  Musculoskeletal: Positive for back pain. Negative for gait problem.  Skin: Negative for rash and wound.  Allergic/Immunologic: Positive for environmental allergies.  Neurological: Negative for syncope, weakness and headaches.  Psychiatric/Behavioral: Negative for confusion. The patient is nervous/anxious.     Current Outpatient Medications on File Prior to Visit  Medication Sig Dispense Refill  . Ascorbic Acid (VITAMIN C) 100 MG tablet Take 100 mg by mouth daily.    Marland Kitchen aspirin 81 MG tablet Take 81 mg by mouth daily.    Marland Kitchen azelastine (ASTELIN) 0.1 % nasal spray Place 2 sprays into both nostrils 2 (two) times daily. Use in each nostril as directed 30 mL 3  . cholecalciferol (VITAMIN D) 1000 UNITS tablet Take 1,000 Units by mouth daily.    . fluticasone (FLONASE) 50 MCG/ACT nasal spray Place 1 spray into both nostrils 2 (two) times daily. 16 g 3  . gabapentin (NEURONTIN) 100 MG capsule TAKE 2 CAPSULES( 200 MG) BY MOUTH AT BEDTIME. 180 capsule 0  . losartan (COZAAR) 100 MG tablet TAKE 1 TABLET(100 MG) BY MOUTH DAILY 90 tablet 0  .  potassium chloride (K-DUR) 10 MEQ tablet Take 1 tablet (10 mEq total) 2 (two) times daily by mouth. 60 tablet 2  . traMADol (ULTRAM) 50 MG tablet Take 1 tablet (50 mg total) by mouth every 12 (twelve) hours as needed. 40 tablet 2  . VITAMIN E PO Take 1 tablet by mouth daily.    . fexofenadine (ALLEGRA ALLERGY)  180 MG tablet Take 1 tablet (180 mg total) by mouth daily. (Patient not taking: Reported on 03/04/2017) 90 tablet 3   No current facility-administered medications on file prior to visit.      Past Medical History:  Diagnosis Date  . High cholesterol   . Hypertension    Allergies  Allergen Reactions  . Penicillins Hives    Has patient had a PCN reaction causing immediate rash, facial/tongue/throat swelling, SOB or lightheadedness with hypotension: Yes Has patient had a PCN reaction causing severe rash involving mucus membranes or skin necrosis: Unknown Has patient had a PCN reaction that required hospitalization: No Has patient had a PCN reaction occurring within the last 10 years: No If all of the above answers are "NO", then may proceed with Cephalosporin use.     Social History   Socioeconomic History  . Marital status: Married    Spouse name: None  . Number of children: None  . Years of education: None  . Highest education level: None  Social Needs  . Financial resource strain: None  . Food insecurity - worry: None  . Food insecurity - inability: None  . Transportation needs - medical: None  . Transportation needs - non-medical: None  Occupational History  . None  Tobacco Use  . Smoking status: Never Smoker  . Smokeless tobacco: Never Used  Substance and Sexual Activity  . Alcohol use: No  . Drug use: No  . Sexual activity: No  Other Topics Concern  . None  Social History Narrative  . None    Vitals:   03/04/17 1056  BP: 130/77  Pulse: 88  Resp: 12  Temp: 98.2 F (36.8 C)  SpO2: 95%   Body mass index is 33.34 kg/m.   Physical Exam  Nursing note and vitals reviewed. Constitutional: She is oriented to person, place, and time. She appears well-developed. No distress.  HENT:  Head: Normocephalic and atraumatic.  Nose: Right sinus exhibits maxillary sinus tenderness. Right sinus exhibits no frontal sinus tenderness. Left sinus exhibits maxillary sinus  tenderness. Left sinus exhibits no frontal sinus tenderness.  Mouth/Throat: Oropharynx is clear and moist and mucous membranes are normal.  Hypertrophic turbinates. Normal sinus transillumination.   Eyes: Conjunctivae and EOM are normal. Pupils are equal, round, and reactive to light.  Neck: No tracheal deviation present. No thyroid mass present.  Cardiovascular: Normal rate and regular rhythm.  No murmur heard. Pulses:      Dorsalis pedis pulses are 2+ on the right side, and 2+ on the left side.  Respiratory: Effort normal and breath sounds normal. No respiratory distress.  GI: Soft. She exhibits no mass. There is no hepatomegaly. There is no tenderness.  Musculoskeletal: She exhibits no edema.       Lumbar back: She exhibits tenderness. She exhibits no bony tenderness.       Back:  Tenderness upon palpation of lumbar paraspinal muscles, left side.   Limitation of flexion left hip and knee. Crepitus with ROM of knees, bilateral.   Lymphadenopathy:    She has no cervical adenopathy.  Neurological: She is alert and oriented to person,  place, and time. She has normal strength.  Stable gait with no assistance needed today.  Skin: Skin is warm. No rash noted. No erythema.  Psychiatric: She has a normal mood and affect.  Well groomed, good eye contact.    ASSESSMENT AND PLAN:   [name] was seen today for 5 months follow-up.   [name] was seen today for follow-up.  Diagnoses and all orders for this visit:  Lab Results  Component Value Date   HGBA1C 6.4 03/04/2017   Lab Results  Component Value Date   CREATININE 0.91 03/04/2017   BUN 14 03/04/2017   NA 144 03/04/2017   K 3.1 (L) 03/04/2017   CL 103 03/04/2017   CO2 30 03/04/2017    Prediabetes  Primary prevention through a healthy lifestyle recommended. Weight loss may certainly help. The recommendation will be given according to lab results.  -     Hemoglobin A1c  Chronic bilateral low back pain  with bilateral sciatica  Stable overall. We review Tramadol side effects. Fall precautions. Continue Gabapentin 300 mg 3-4 times per week.  Essential hypertension, benign  Adequately controlled. No changes in current management. DASH-Low-salt diet recommended. Eye exam recommended annually. F/U in 6 months, before if needed.  -     metoprolol succinate (TOPROL-XL) 100 MG 24 hr tablet; TAKE 1 TABLET BY MOUTH DAILY WITH OR IMMEDIATELY FOLLOWING A MEAL -     Basic metabolic panel  Hypokalemia  No changes in current management, will follow labs done today and will give further recommendations accordingly.  -     Basic metabolic panel  Allergic rhinitis, unspecified seasonality, unspecified trigger  Educated about diagnosis. She has some tenderness upon percussion of sinuses. She is not interested in antibiotic treatment. H your course of Prednisone might help. Nasal irrigation with saline as needed. Appointment with ENT will be arranged as requested.  -     Ambulatory referral to ENT -     predniSONE (DELTASONE) 20 MG tablet; Take 2 tablets (40 mg total) by mouth daily with breakfast for 3 days.     -[name] was advised to return sooner than planned today if new concerns arise.       Antwione Picotte G. [place], MD  [facility]. [place] office.

## 2017-03-26 DIAGNOSIS — R51 Headache: Secondary | ICD-10-CM | POA: Diagnosis not present

## 2017-03-26 DIAGNOSIS — J343 Hypertrophy of nasal turbinates: Secondary | ICD-10-CM | POA: Diagnosis not present

## 2017-03-26 DIAGNOSIS — J342 Deviated nasal septum: Secondary | ICD-10-CM | POA: Diagnosis not present

## 2017-03-29 ENCOUNTER — Other Ambulatory Visit (INDEPENDENT_AMBULATORY_CARE_PROVIDER_SITE_OTHER): Payer: Self-pay | Admitting: Otolaryngology

## 2017-03-29 DIAGNOSIS — J329 Chronic sinusitis, unspecified: Secondary | ICD-10-CM

## 2017-03-30 ENCOUNTER — Other Ambulatory Visit: Payer: Self-pay | Admitting: Family Medicine

## 2017-04-19 ENCOUNTER — Ambulatory Visit
Admission: RE | Admit: 2017-04-19 | Discharge: 2017-04-19 | Disposition: A | Discharge status: Home / Self Care | Payer: Medicare Other | Source: Ambulatory Visit | Attending: Otolaryngology | Admitting: Otolaryngology

## 2017-04-19 DIAGNOSIS — J329 Chronic sinusitis, unspecified: Secondary | ICD-10-CM | POA: Diagnosis not present

## 2017-05-01 DIAGNOSIS — R040 Epistaxis: Secondary | ICD-10-CM | POA: Diagnosis not present

## 2017-05-11 ENCOUNTER — Other Ambulatory Visit: Payer: Self-pay | Admitting: Family Medicine

## 2017-05-11 DIAGNOSIS — I10 Essential (primary) hypertension: Secondary | ICD-10-CM

## 2017-05-13 ENCOUNTER — Encounter (HOSPITAL_COMMUNITY): Payer: Self-pay | Admitting: Family Medicine

## 2017-05-13 ENCOUNTER — Other Ambulatory Visit: Payer: Self-pay

## 2017-05-13 ENCOUNTER — Ambulatory Visit (HOSPITAL_COMMUNITY)
Admission: EM | Admit: 2017-05-13 | Discharge: 2017-05-13 | Disposition: A | Discharge status: Other Acute Inpatient Hospital | Payer: Medicare Other | Source: Home / Self Care

## 2017-05-13 ENCOUNTER — Encounter (HOSPITAL_COMMUNITY): Payer: Self-pay | Admitting: Emergency Medicine

## 2017-05-13 DIAGNOSIS — R000 Tachycardia, unspecified: Secondary | ICD-10-CM

## 2017-05-13 DIAGNOSIS — Z7982 Long term (current) use of aspirin: Secondary | ICD-10-CM | POA: Diagnosis not present

## 2017-05-13 DIAGNOSIS — R040 Epistaxis: Secondary | ICD-10-CM | POA: Diagnosis not present

## 2017-05-13 DIAGNOSIS — J339 Nasal polyp, unspecified: Secondary | ICD-10-CM

## 2017-05-13 DIAGNOSIS — R030 Elevated blood-pressure reading, without diagnosis of hypertension: Secondary | ICD-10-CM

## 2017-05-13 DIAGNOSIS — J309 Allergic rhinitis, unspecified: Secondary | ICD-10-CM

## 2017-05-13 DIAGNOSIS — I10 Essential (primary) hypertension: Secondary | ICD-10-CM

## 2017-05-13 DIAGNOSIS — Z79899 Other long term (current) drug therapy: Secondary | ICD-10-CM | POA: Insufficient documentation

## 2017-05-13 DIAGNOSIS — E7800 Pure hypercholesterolemia, unspecified: Secondary | ICD-10-CM

## 2017-05-13 MED ORDER — OXYMETAZOLINE HCL 0.05 % NA SOLN: 1.0000 | Freq: Once | NASAL | Status: DC

## 2017-05-13 NOTE — ED Triage Notes (Signed)
Pt reports a nose bleed starting this morning, pt not on blood thinners.  Has been treated for this in the past, urgent care sent her here b/c they too were unable to stop the bleed.  Bleed is severe at this time.

## 2017-05-13 NOTE — Discharge Instructions (Signed)
Please report to the ER immediately as you may need cautery for your nosebleed and emergent management of your high blood pressure.

## 2017-05-13 NOTE — ED Provider Notes (Signed)
MRN: [id number] DOB: [birth year]  Subjective:   Meghan Nichols is a 67 y.o. female presenting for persistent nose bleed this morning. She has tried firm pressure for 10 minutes, icing without any relief. Patient has had longstanding history of nasal polyps, allergic rhinitis. Has used nasal sprays in the past including Flonase and Astelin but has not used these for the past 3 months. Patient has seen her ENT, Dr. Suszanne Connerseoh, on 05/01/2017, for follow up and had a CT scan done that showed 7mm nasal polyp of left maxillary ostium/infundibulum. CT scan was done for persistent migraine headaches. Patient had a nosebleed 2 days prior to her OV on 05/01/2017, had cautery performed in the clinic. Has not had follow up since then. Denies headache, dizziness, confusion, chest pain, belly pain. Denies smoking cigarettes.   No current facility-administered medications for this encounter.   Current Outpatient Medications:  .  Ascorbic Acid (VITAMIN C) 100 MG tablet, Take 100 mg by mouth daily., Disp: , Rfl:  .  aspirin 81 MG tablet, Take 81 mg by mouth daily., Disp: , Rfl:  .  azelastine (ASTELIN) 0.1 % nasal spray, Place 2 sprays into both nostrils 2 (two) times daily. Use in each nostril as directed, Disp: 30 mL, Rfl: 3 .  cholecalciferol (VITAMIN D) 1000 UNITS tablet, Take 1,000 Units by mouth daily., Disp: , Rfl:  .  fexofenadine (ALLEGRA ALLERGY) 180 MG tablet, Take 1 tablet (180 mg total) by mouth daily. (Patient not taking: Reported on 03/04/2017), Disp: 90 tablet, Rfl: 3 .  fluticasone (FLONASE) 50 MCG/ACT nasal spray, Place 1 spray into both nostrils 2 (two) times daily., Disp: 16 g, Rfl: 3 .  gabapentin (NEURONTIN) 100 MG capsule, TAKE 2 CAPSULES( 200 MG) BY MOUTH AT BEDTIME., Disp: 180 capsule, Rfl: 0 .  losartan (COZAAR) 100 MG tablet, TAKE 1 TABLET(100 MG) BY MOUTH DAILY, Disp: 90 tablet, Rfl: 0 .  metoprolol succinate (TOPROL-XL) 100 MG 24 hr tablet, TAKE 1 TABLET BY MOUTH DAILY WITH OR IMMEDIATELY  FOLLOWING A MEAL, Disp: 90 tablet, Rfl: 1 .  potassium chloride (K-DUR,KLOR-CON) 10 MEQ tablet, TAKE 1 TABLET(10 MEQ) BY MOUTH TWICE DAILY, Disp: 60 tablet, Rfl: 0 .  traMADol (ULTRAM) 50 MG tablet, Take 1 tablet (50 mg total) by mouth every 12 (twelve) hours as needed., Disp: 40 tablet, Rfl: 2 .  VITAMIN E PO, Take 1 tablet by mouth daily., Disp: , Rfl:    Allergies  Allergen Reactions  . Penicillins Hives    Has patient had a PCN reaction causing immediate rash, facial/tongue/throat swelling, SOB or lightheadedness with hypotension: Yes Has patient had a PCN reaction causing severe rash involving mucus membranes or skin necrosis: Unknown Has patient had a PCN reaction that required hospitalization: No Has patient had a PCN reaction occurring within the last 10 years: No If all of the above answers are "NO", then may proceed with Cephalosporin use.     Past Medical History:  Diagnosis Date  . High cholesterol   . Hypertension      Past Surgical History:  Procedure Laterality Date  . ABDOMINAL HYSTERECTOMY      Objective:   Vitals: BP (!) 190/116   Pulse (!) 118   Temp 98.1 F (36.7 C)   Resp 18   SpO2 97%   BP Readings from Last 3 Encounters:  05/13/17 (!) 190/116  03/04/17 130/77  12/17/16 140/82    Physical Exam  Constitutional: She is oriented to person, place, and time. She appears well-developed and  well-nourished.  HENT:  Nose: Epistaxis (left sided) is observed.  Mouth/Throat: Oropharynx is clear and moist.  Eyes: No scleral icterus.  Cardiovascular: Regular rhythm and intact distal pulses. Tachycardia present. Exam reveals no gallop and no friction rub.  No murmur heard. Pulmonary/Chest: No respiratory distress. She has no wheezes. She has no rales.  Neurological: She is alert and oriented to person, place, and time.  Skin: Skin is warm and dry.  Psychiatric: She has a normal mood and affect.    Assessment and Plan :   Epistaxis  Essential  hypertension  Elevated blood pressure reading  High cholesterol  Nasal polyp  Allergic rhinitis, unspecified seasonality, unspecified trigger  Tachycardia  Referred patient to ER for management of her epistaxis in setting of HTN urgency, tachycardia. Patient is to follow up with her ENT physician otherwise. Return-to-clinic precautions discussed, patient verbalized understanding.    Wallis Bamberg, [place] 05/13/17 1749

## 2017-05-13 NOTE — ED Triage Notes (Addendum)
Pt here for nose bleed that started last week and worsening this am. No blood thinner. No injury. She was seen by an ENT doctor this month and told she had blood vessels that had burst. Dr. Suszanne Connerseoh.

## 2017-05-14 ENCOUNTER — Emergency Department (HOSPITAL_COMMUNITY)
Admission: EM | Admit: 2017-05-14 | Discharge: 2017-05-14 | Disposition: A | Discharge status: Home / Self Care | Payer: Medicare Other | Attending: Emergency Medicine | Admitting: Emergency Medicine

## 2017-05-14 DIAGNOSIS — R040 Epistaxis: Secondary | ICD-10-CM

## 2017-05-14 MED ORDER — SULFAMETHOXAZOLE-TRIMETHOPRIM 800-160 MG PO TABS: 1.0000 | ORAL_TABLET | Freq: Two times a day (BID) | ORAL | 0 refills | Status: AC

## 2017-05-14 NOTE — ED Notes (Signed)
Pt upset about wait time, pt demanding to know how many doctors are working

## 2017-05-14 NOTE — Discharge Instructions (Signed)
As discussed, take your entire course of antibiotics. Follow up with Dr. Suszanne Connerseoh in office tomorrow.  Return if symptoms worsen or new concerning symptoms in the meantime.

## 2017-05-14 NOTE — ED Provider Notes (Signed)
[facility] EMERGENCY DEPARTMENT Provider Note   CSN: [id number] Arrival date & time: 05/13/17  1756     History   Chief Complaint Chief Complaint  Patient presents with  . Epistaxis    HPI Meghan Nichols is a 67 y.o. female with PMH significant for migraine headaches, HTN,  presenting from Urgent Care with persistent epistaxis. Bleeding controlled at this time. She has tried applying ice and some pressure without success. She reports taking her blood pressure medications today but states that she was experiencing some back pain and when she is in the Emergency department, her blood pressure goes up. No anticoagulant use. Denies headache, visual disturbances, nausea, vomiting, chest pain, shortness of breath or other symptoms. She is follow by Dr. Suszanne Conners ENT.   HPI  Past Medical History:  Diagnosis Date  . High cholesterol   . Hypertension     Patient Active Problem List   Diagnosis Date Noted  . Prediabetes 03/04/2017  . Class 1 obesity with body mass index (BMI) of 33.0 to 33.9 in adult 10/08/2016  . Chronic bilateral low back pain with bilateral sciatica 01/05/2016  . IFG (impaired fasting glucose) 01/02/2016  . Chronic back pain 01/02/2016  . Hypokalemia 08/28/2015  . Essential hypertension, benign 08/25/2015  . Hyperlipemia, mixed 08/25/2015  . Allergic rhinitis 08/25/2015  . Postmenopausal 04/09/2012    Past Surgical History:  Procedure Laterality Date  . ABDOMINAL HYSTERECTOMY       OB History    Gravida  2   Para  2   Term      Preterm      AB      Living  2     SAB      TAB      Ectopic      Multiple      Live Births               Home Medications    Prior to Admission medications   Medication Sig Start Date End Date Taking? Authorizing Provider  Ascorbic Acid (VITAMIN C) 100 MG tablet Take 100 mg by mouth daily.   Yes [provider]  aspirin 81 MG tablet Take 81 mg by mouth daily.   Yes [provider]  cholecalciferol (VITAMIN D) 1000 UNITS tablet Take 1,000 Units by mouth daily.   Yes [provider]  losartan (COZAAR) 100 MG tablet TAKE 1 TABLET(100 MG) BY MOUTH DAILY 05/13/17  Yes [place], Betty G, MD  metoprolol succinate (TOPROL-XL) 100 MG 24 hr tablet TAKE 1 TABLET BY MOUTH DAILY WITH OR IMMEDIATELY FOLLOWING A MEAL 03/04/17  Yes [place], Betty G, MD  potassium chloride (K-DUR,KLOR-CON) 10 MEQ tablet TAKE 1 TABLET(10 MEQ) BY MOUTH TWICE DAILY 05/13/17  Yes [place], Betty G, MD  VITAMIN E PO Take 1 tablet by mouth daily.   Yes [provider]  azelastine (ASTELIN) 0.1 % nasal spray Place 2 sprays into both nostrils 2 (two) times daily. Use in each nostril as directed Patient not taking: Reported on 05/14/2017 03/02/16   [place], Betty G, MD  fexofenadine [facility] ALLERGY) 180 MG tablet Take 1 tablet (180 mg total) by mouth daily. Patient not taking: Reported on 03/04/2017 07/09/16   [place], Betty G, MD  fluticasone [facility]) 50 MCG/ACT nasal spray Place 1 spray into both nostrils 2 (two) times daily. Patient not taking: Reported on 05/14/2017 03/02/16   [place], Betty G, MD  gabapentin (NEURONTIN) 100 MG capsule TAKE 2 CAPSULES( 200  MG) BY MOUTH AT BEDTIME. Patient not taking: Reported on 05/14/2017 01/01/17   [place]ilandJordan, Betty G, MD  sulfamethoxazole-trimethoprim (BACTRIM DS,SEPTRA DS) 800-160 MG tablet Take 1 tablet by mouth 2 (two) times daily for 3 days. 05/14/17 05/17/17  Mathews RobinsonsMitchell, Joran Kallal B, PA-C  traMADol (ULTRAM) 50 MG tablet Take 1 tablet (50 mg total) by mouth every 12 (twelve) hours as needed. Patient not taking: Reported on 05/14/2017 12/17/16   SwazilandJordan, Betty G, MD    Family History Family History  Problem Relation Age of Onset  . Hypertension Mother   . Hypertension Father   . Hypertension Sister   . Diabetes Sister   . Hypertension Maternal Grandmother   . Hypertension Maternal Grandfather   . Diabetes Sister     Social History Social History    Tobacco Use  . Smoking status: Never Smoker  . Smokeless tobacco: Never Used  Substance Use Topics  . Alcohol use: No  . Drug use: No     Allergies   Penicillins   Review of Systems Review of Systems  Constitutional: Negative for chills and fever.  HENT: Positive for nosebleeds. Negative for congestion.   Eyes: Negative for photophobia, pain, redness and visual disturbance.  Respiratory: Negative for chest tightness, shortness of breath, wheezing and stridor.   Cardiovascular: Negative for chest pain and palpitations.  Gastrointestinal: Negative for nausea and vomiting.  Musculoskeletal: Negative for neck pain and neck stiffness.  Skin: Negative for color change, pallor and rash.  Neurological: Negative for dizziness, light-headedness and headaches.     Physical Exam Updated Vital Signs BP (!) 167/115 (BP Location: Left Arm)   Pulse 99   Temp 98.2 F (36.8 C) (Oral)   Resp 16   SpO2 100%   Physical Exam  Constitutional: She is oriented to person, place, and time. She appears well-developed and well-nourished. No distress.  Afebrile, non-toxic, sitting comfortably in bed in no acute distress. Bleeding controlled at this time.  HENT:  Head: Normocephalic and atraumatic.  Eyes: Conjunctivae and EOM are normal. Right eye exhibits no discharge. Left eye exhibits no discharge. No scleral icterus.  Neck: Normal range of motion. Neck supple.  Cardiovascular: Normal rate, regular rhythm, normal heart sounds and intact distal pulses.  No murmur heard. Pulmonary/Chest: Effort normal and breath sounds normal. No stridor. No respiratory distress. She has no wheezes. She has no rales.  Musculoskeletal: Normal range of motion. She exhibits no edema.  Neurological: She is alert and oriented to person, place, and time.  Skin: Skin is warm and dry. Capillary refill takes less than 2 seconds. No rash noted. She is not diaphoretic. No erythema. No pallor.  Psychiatric: She has a  normal mood and affect.  Nursing note and vitals reviewed.    ED Treatments / Results  Labs (all labs ordered are listed, but only abnormal results are displayed) Labs Reviewed - No data to display  EKG None  Radiology No results found.  Procedures .Epistaxis Management Date/Time: 05/14/2017 5:21 AM Performed by: Georgiana ShoreMitchell, Sophia Cubero B, PA-C Authorized by: Georgiana ShoreMitchell, Teniyah Seivert B, PA-C   Consent:    Consent obtained:  Verbal   Consent given by:  Patient   Risks discussed:  Bleeding, infection and pain   Alternatives discussed:  No treatment Procedure details:    Treatment site:  L anterior   Treatment method:  Nasal tampon   Treatment complexity:  Limited   Treatment episode: initial   Post-procedure details:    Assessment:  Bleeding stopped   Patient  tolerance of procedure:  Tolerated well, no immediate complications   (including critical care time)  Medications Ordered in ED Medications  oxymetazoline (AFRIN) 0.05 % nasal spray 1 spray (has no administration in time range)     Initial Impression / Assessment and Plan / ED Course  I have reviewed the triage vital signs and the nursing notes.  Pertinent labs & imaging results that were available during my care of the patient were reviewed by me and considered in my medical decision making (see chart for details).    Patient presenting from urgent care with persistent epistaxis which resolved prior to initial evaluation. She was initially hypertensive and tachycardic while ongoing bleeding in triage.  She is now comfortable with resolution of epistaxis. Clot visualized in the left nares just inferior to previously cauterized area of the septum. Had patient blow her nose to dislodge clot and inserted rapid rhino.  Patient was observed for some time in the ED without any bleeding or complications. On reassessment, she was resting comfortably. She overall improved during her stay in the ED. Will dc home with abx and close  follow up with ENT.  Discussed strict return precautions and advised to return to the emergency department if experiencing any new or worsening symptoms. Instructions were understood and patient agreed with discharge plan. Final Clinical Impressions(s) / ED Diagnoses   Final diagnoses:  Epistaxis    ED Discharge Orders        Ordered    sulfamethoxazole-trimethoprim (BACTRIM DS,SEPTRA DS) 800-160 MG tablet  2 times daily     05/14/17 0624       Georgiana Shore, PA-C 05/14/17 1610    Dione Booze, MD 05/14/17 [id number]

## 2017-05-17 DIAGNOSIS — R040 Epistaxis: Secondary | ICD-10-CM | POA: Diagnosis not present

## 2017-06-05 DIAGNOSIS — H40013 Open angle with borderline findings, low risk, bilateral: Secondary | ICD-10-CM | POA: Diagnosis not present

## 2017-06-05 DIAGNOSIS — H43813 Vitreous degeneration, bilateral: Secondary | ICD-10-CM | POA: Diagnosis not present

## 2017-06-05 DIAGNOSIS — H2513 Age-related nuclear cataract, bilateral: Secondary | ICD-10-CM | POA: Diagnosis not present

## 2017-06-12 ENCOUNTER — Other Ambulatory Visit: Payer: Self-pay | Admitting: Family Medicine

## 2017-06-12 ENCOUNTER — Telehealth: Payer: Self-pay | Admitting: Family Medicine

## 2017-06-12 DIAGNOSIS — I10 Essential (primary) hypertension: Secondary | ICD-10-CM

## 2017-06-12 NOTE — Telephone Encounter (Signed)
Copied from CRM [id number]. Topic: Quick Communication - See Telephone Encounter >> Jun 12, 2017 10:26 AM Windy KalataMichael, Lailee Hoelzel L, NT wrote: CRM for notification. See Telephone encounter for: 06/12/17.  Patient is needing a refill on metoprolol succinate (TOPROL-XL) 100 MG 24 hr tablet sent to her new pharmacy. Please advise.  [id number] [place], [place] - [address] [facility] [address] [place] [id number] Phone: [phone] Fax: [phone]

## 2017-06-13 NOTE — Telephone Encounter (Signed)
[facility] and [facility] [place] who says she will call over to [facility] to retrieve the Metoprolol for the patient.

## 2017-08-05 ENCOUNTER — Ambulatory Visit (INDEPENDENT_AMBULATORY_CARE_PROVIDER_SITE_OTHER): Payer: Medicare Other | Admitting: Family Medicine

## 2017-08-05 ENCOUNTER — Encounter: Payer: Self-pay | Admitting: Family Medicine

## 2017-08-05 VITALS — BP 140/88 | HR 93 | Temp 98.3°F | Resp 16 | Ht 64.0 in | Wt 194.1 lb

## 2017-08-05 DIAGNOSIS — Z6833 Body mass index (BMI) 33.0-33.9, adult: Secondary | ICD-10-CM

## 2017-08-05 DIAGNOSIS — G8929 Other chronic pain: Secondary | ICD-10-CM

## 2017-08-05 DIAGNOSIS — M5442 Lumbago with sciatica, left side: Secondary | ICD-10-CM | POA: Diagnosis not present

## 2017-08-05 DIAGNOSIS — M5441 Lumbago with sciatica, right side: Secondary | ICD-10-CM | POA: Diagnosis not present

## 2017-08-05 DIAGNOSIS — E876 Hypokalemia: Secondary | ICD-10-CM

## 2017-08-05 DIAGNOSIS — E669 Obesity, unspecified: Secondary | ICD-10-CM | POA: Diagnosis not present

## 2017-08-05 DIAGNOSIS — I10 Essential (primary) hypertension: Secondary | ICD-10-CM | POA: Diagnosis not present

## 2017-08-05 LAB — BASIC METABOLIC PANEL
BUN: 13 mg/dL (ref 6–23)
CO2: 31 mEq/L (ref 19–32)
Calcium: 9.6 mg/dL (ref 8.4–10.5)
Chloride: 102 mEq/L (ref 96–112)
Creatinine, Ser: 0.96 mg/dL (ref 0.40–1.20)
GFR: 74.99 mL/min (ref >60.00)
Glucose, Bld: 87 mg/dL (ref 70–99)
Potassium: 3.7 mEq/L (ref 3.5–5.1)
Sodium: 143 mEq/L (ref 135–145)

## 2017-08-05 MED ORDER — LOSARTAN POTASSIUM 100 MG PO TABS
ORAL_TABLET | ORAL | 2 refills | Status: DC
Start: 2017-08-05 — End: 2018-02-03

## 2017-08-05 MED ORDER — TRAMADOL HCL 50 MG PO TABS: 50.0000 mg | ORAL_TABLET | Freq: Two times a day (BID) | ORAL | 2 refills | Status: DC | PRN

## 2017-08-05 NOTE — Assessment & Plan Note (Signed)
Her weight has been stable. Weight loss definitely will help with slowing down progression of disease. Continue a healthy diet and regular physical activity.

## 2017-08-05 NOTE — Assessment & Plan Note (Signed)
She will resume Tramadol, which was helping with pain. She was tolerating medication well, side effects reviewed. Follow-up in 5 to 6 months, before if needed.

## 2017-08-05 NOTE — Patient Instructions (Signed)
A few things to remember from today's visit:   Hypokalemia - Plan: Basic metabolic panel, Aldosterone + renin activity w/ ratio  Essential hypertension, benign  Chronic bilateral low back pain with bilateral sciatica  No changes today.   Please be sure medication list is accurate. If a new problem present, please set up appointment sooner than planned today.

## 2017-08-05 NOTE — Assessment & Plan Note (Signed)
Persistent hyperkalemia despite of stopping HCTZ, taking potassium supplementation, and potassium containing foods. We will recheck it today along with aldosterone+renin activity. Further recommendation will be given according to lab results.

## 2017-08-05 NOTE — Progress Notes (Signed)
HPI:   MeghanMeghan Nichols is a 67 y.o. female, who is here today for 5-6 months follow up.   She was last seen on 03/04/17.  Since her last OV she has been in the ED 2 times and seen ENT.  Also her husband was recently diagnosed with prostate cancer, he is receiving cryotherapy.    Chronic back pain with radiation to LE's,burning and numbness sensation.   She is on Tramadol 50 mg ,which she has not had in about 3 months [facility] She noted that Tramadol was helping with pain. Pain is exacerbated by prolonged standing and walking.Also at night when moving in bed.  Achy ,sharp pain 8/10.  Today she is requesting a handicap sticker. She uses her cane. No falls since her last visit.   Gabapentin discontinued because he was causing drowsiness and effect lasted 2 to 3 days.  She is still exercising, about twice per week.  She has not been able to swim because she is driving her husband to the cancer center about 5 times per week.  Hypertension:   Currently on Cozaar 100 mg daily and Metoprolol Succinate 100 mg daily.   She is taking medications as instructed, no side effects reported.  She has not noted unusual headache, visual changes, exertional chest pain, dyspnea,  focal weakness, or edema.   Lab Results  Component Value Date   CREATININE 0.91 03/04/2017   BUN 14 03/04/2017   NA 144 03/04/2017   K 3.1 (L) 03/04/2017   CL 103 03/04/2017   CO2 30 03/04/2017   Hypokalemia:  She is currently on K-Lor 10 mEq twice daily. She does not eat licorice candy. She eats a banana daily and oral juice.   Review of Systems  Constitutional: Negative for activity change, appetite change, fatigue and fever.  HENT: Negative for mouth sores, nosebleeds and trouble swallowing.   Eyes: Negative for redness and visual disturbance.  Respiratory: Negative for cough, shortness of breath and wheezing.   Cardiovascular: Negative for chest pain,  palpitations and leg swelling.  Gastrointestinal: Negative for abdominal pain, nausea and vomiting.       Negative for changes in bowel habits.  Genitourinary: Negative for decreased urine volume, dysuria and hematuria.  Musculoskeletal: Positive for arthralgias, back pain and gait problem.  Skin: Negative for rash.  Neurological: Positive for numbness. Negative for syncope, weakness and headaches.  Psychiatric/Behavioral: Positive for sleep disturbance. Negative for confusion.      Current Outpatient Medications on File Prior to Visit  Medication Sig Dispense Refill  . Ascorbic Acid (VITAMIN C) 100 MG tablet Take 100 mg by mouth daily.    Marland Kitchen. aspirin 81 MG tablet Take 81 mg by mouth daily.    Marland Kitchen. azelastine (ASTELIN) 0.1 % nasal spray Place 2 sprays into both nostrils 2 (two) times daily. Use in each nostril as directed 30 mL 3  . cholecalciferol (VITAMIN D) 1000 UNITS tablet Take 1,000 Units by mouth daily.    . fexofenadine (ALLEGRA ALLERGY) 180 MG tablet Take 1 tablet (180 mg total) by mouth daily. 90 tablet 3  . fluticasone (FLONASE) 50 MCG/ACT nasal spray Place 1 spray into both nostrils 2 (two) times daily. 16 g 3  . KLOR-CON M10 10 MEQ tablet TAKE 1 TABLET TWICE DAILY 60 tablet 0  . metoprolol succinate (TOPROL-XL) 100 MG 24 hr tablet TAKE 1 TABLET BY MOUTH DAILY WITH OR IMMEDIATELY FOLLOWING A MEAL 90 tablet 1  .  VITAMIN E PO Take 1 tablet by mouth daily.     No current facility-administered medications on file prior to visit.      Past Medical History:  Diagnosis Date  . High cholesterol   . Hypertension    Allergies  Allergen Reactions  . Penicillins Hives    Has patient had a PCN reaction causing immediate rash, facial/tongue/throat swelling, SOB or lightheadedness with hypotension: Yes Has patient had a PCN reaction causing severe rash involving mucus membranes or skin necrosis: Unknown Has patient had a PCN reaction that required hospitalization: No Has patient had a  PCN reaction occurring within the last 10 years: No If all of the above answers are "NO", then may proceed with Cephalosporin use.     Social History   Socioeconomic History  . Marital status: Married    Spouse name: Not on file  . Number of children: Not on file  . Years of education: Not on file  . Highest education level: Not on file  Occupational History  . Not on file  Social Needs  . Financial resource strain: Not on file  . Food insecurity:    Worry: Not on file    Inability: Not on file  . Transportation needs:    Medical: Not on file    Non-medical: Not on file  Tobacco Use  . Smoking status: Never Smoker  . Smokeless tobacco: Never Used  Substance and Sexual Activity  . Alcohol use: No  . Drug use: No  . Sexual activity: Never  Lifestyle  . Physical activity:    Days per week: Not on file    Minutes per session: Not on file  . Stress: Not on file  Relationships  . Social connections:    Talks on phone: Not on file    Gets together: Not on file    Attends religious service: Not on file    Active member of club or organization: Not on file    Attends meetings of clubs or organizations: Not on file    Relationship status: Not on file  Other Topics Concern  . Not on file  Social History Narrative  . Not on file    Vitals:   08/05/17 1048  BP: 140/88  Pulse: 93  Resp: 16  Temp: 98.3 F (36.8 C)  SpO2: 98%   Body mass index is 33.32 kg/m.  Wt Readings from Last 3 Encounters:  08/05/17 194 lb 2 oz (88.1 kg)  03/04/17 194 lb 4 oz (88.1 kg)  12/17/16 198 lb 8 oz (90 kg)     Physical Exam  Nursing note and vitals reviewed. Constitutional: She is oriented to person, place, and time. She appears well-developed. No distress.  HENT:  Head: Normocephalic and atraumatic.  Mouth/Throat: Oropharynx is clear and moist and mucous membranes are normal.  Eyes: Pupils are equal, round, and reactive to light. Conjunctivae are normal.  Cardiovascular:  Normal rate and regular rhythm.  No murmur heard. Pulses:      Dorsalis pedis pulses are 2+ on the right side, and 2+ on the left side.  Respiratory: Effort normal and breath sounds normal. No respiratory distress.  GI: Soft. She exhibits no mass. There is no hepatomegaly. There is no tenderness.  Musculoskeletal: She exhibits no edema.       Lumbar back: She exhibits decreased range of motion and tenderness. She exhibits no bony tenderness.       Back:  Lymphadenopathy:    She has no cervical  adenopathy.  Neurological: She is alert and oriented to person, place, and time. She has normal strength.  Stable gait assisted with a cane.  Skin: Skin is warm. No rash noted. No erythema.  Psychiatric: Her mood appears anxious.  Well groomed, good eye contact.     ASSESSMENT AND PLAN:   Meghan Nichols was seen today for 5 months follow-up.  Orders Placed This Encounter  Procedures  . Basic metabolic panel  . Aldosterone + renin activity w/ ratio   Lab Results  Component Value Date   CREATININE 0.96 08/05/2017   BUN 13 08/05/2017   NA 143 08/05/2017   K 3.7 08/05/2017   CL 102 08/05/2017   CO2 31 08/05/2017    Essential hypertension, benign Await all BP is adequately controlled, she is reporting lower BPs at home. No changes in current management. Continue low-salt diet. Follow-up in 6 months, before if needed.  Hypokalemia Persistent hyperkalemia despite of stopping HCTZ, taking potassium supplementation, and potassium containing foods. We will recheck it today along with aldosterone+renin activity. Further recommendation will be given according to lab results.  Chronic bilateral low back pain with bilateral sciatica She will resume Tramadol, which was helping with pain. She was tolerating medication well, side effects reviewed. Follow-up in 5 to 6 months, before if needed.  Class 1 obesity with body mass index (BMI) of 33.0 to 33.9 in adult Her weight has been  stable. Weight loss definitely will help with slowing down progression of disease. Continue a healthy diet and regular physical activity.       Betty G. [place], MD  [facility]. [place] office.

## 2017-08-05 NOTE — Assessment & Plan Note (Signed)
Await all BP is adequately controlled, she is reporting lower BPs at home. No changes in current management. Continue low-salt diet. Follow-up in 6 months, before if needed.

## 2017-08-07 ENCOUNTER — Telehealth: Payer: Self-pay | Admitting: *Deleted

## 2017-08-07 NOTE — Telephone Encounter (Signed)
PA for tramdaol 50 mg tablets initiated via CoverMyMeds. Awaiting determination.  Key: [id number]

## 2017-08-08 ENCOUNTER — Other Ambulatory Visit: Payer: Self-pay | Admitting: Family Medicine

## 2017-08-08 NOTE — Telephone Encounter (Signed)
Last OV 08/05/2017   Last OV 06/12/2017 disp 60 with no refills   Sent to PCP to advise

## 2017-08-09 LAB — ALDOSTERONE + RENIN ACTIVITY W/ RATIO
ALDO / PRA Ratio: 150 Ratio — ABNORMAL HIGH (ref 0.9–28.9)
Aldosterone: 15 ng/dL
Renin Activity: 0.1 ng/mL/h — ABNORMAL LOW (ref 0.25–5.82)

## 2017-08-12 NOTE — Telephone Encounter (Signed)
PA denied for the following reason:   We received and reviewed your request for authorization of coverage for Tramadol 50mg  Tablets. We've denied the request for the following reason(s): You do not meet the requirements of your plan. Your plan covers this drug when you meet all of these conditions: - Your pain will be checked by your doctor the first month after your initial prescription or after a dose increase and every 3 months after that - The benefits outweigh the risks of taking the medication

## 2017-08-13 ENCOUNTER — Telehealth: Payer: Self-pay | Admitting: Family Medicine

## 2017-08-13 NOTE — Telephone Encounter (Signed)
Copied from CRM [id number]. Topic: Quick Communication - Rx Refill/Question >> Aug 13, 2017 12:31 PM Alexander BergeronBarksdale, Harvey B wrote: Medication: traMADol (ULTRAM) 50 MG tablet [[id number]][[id number]]   Pt called to see why medication was denied; pt states she called insurance and the insurance states that there was documentation that wasn't filled right by the pcp, contact pt asap to advise

## 2017-08-14 NOTE — Telephone Encounter (Signed)
PA submission reviewed and all documentation was submitted correctly per chart notes. See 08/07/17 phone note for further information regarding denial. Will contact Dr. SwazilandJordan per that encounter and see if she would like to appeal decision or send updated info.

## 2017-08-14 NOTE — Telephone Encounter (Signed)
Patient requesting appeal.   She will need to come in for a follow-up visit one month after starting med and then q 3 months afterwards as long as she is on it for insurance approval (see below). Are you okay with me submitting updated information and scheduling her follow-up appt per insurance guidelines? Thanks!

## 2017-08-14 NOTE — Telephone Encounter (Signed)
Medication: traMADol (ULTRAM) 50 MG tablet [[id number]][[id number]]   Pt reports medication was denied; pt states she called insurance who states that there was incorrect documentation by the PCP.  Pt requests CB [phone]

## 2017-08-16 NOTE — Telephone Encounter (Signed)
She has been on tramadol since 04/2016, dose has been unchanged since then. If [name] is okay with arranging appointment to discuss just this problem and following every 3 months, appointment can be arranged.  Thanks, BJ

## 2017-08-19 ENCOUNTER — Telehealth: Payer: Self-pay | Admitting: *Deleted

## 2017-08-19 NOTE — Telephone Encounter (Signed)
Called patient and left message to return call. If she is agreeable to follow-up every 3 months, will resubmit information to insurance.

## 2017-08-19 NOTE — Telephone Encounter (Signed)
Tramadol 50 mg tablet, alternative requested, PA Required, Patient does not want to pay cash price, patient's insurance requires 90 day supply of meds

## 2017-08-20 NOTE — Telephone Encounter (Signed)
Called patient. She would like alternative to tramadol because she does not want to pay copay for 3 month visits with PCP. Another telephone note has already been sent to PCP with this request. Will close this note.

## 2017-08-23 NOTE — Telephone Encounter (Signed)
Requesting alternative for Tramadol.

## 2017-08-23 NOTE — Telephone Encounter (Signed)
Because tramadol is a controlled medication, 3 month supply cannot be sent.  Thanks, BJ

## 2017-08-27 ENCOUNTER — Encounter: Payer: Self-pay | Admitting: *Deleted

## 2017-08-27 ENCOUNTER — Other Ambulatory Visit: Payer: Self-pay | Admitting: *Deleted

## 2017-08-27 MED ORDER — MELOXICAM 15 MG PO TABS: 15.0000 mg | ORAL_TABLET | Freq: Every day | ORAL | 3 refills | Status: DC

## 2017-08-27 NOTE — Telephone Encounter (Signed)
Spoke with patient, gave instructions per Dr. SwazilandJordan. Patient verbalized understanding. Patient stated that she would try the Mobic 15 mg as needed for now and see how it work before she do the referral to pain management. Patient stated that she could not take Tylenol. Rx for Mobic sent to pharmacy as request as well as letter containing lab results sent to address on file.

## 2017-08-27 NOTE — Telephone Encounter (Signed)
NSAID's is an option, Mobic 15 mg daily as needed + Tylenol 500 mg qid. With NSAID's we have to monitor renal function and BP periodically.  Referral to pain management is another option.  Thanks, BJ

## 2017-12-01 ENCOUNTER — Other Ambulatory Visit: Payer: Self-pay | Admitting: Family Medicine

## 2017-12-01 DIAGNOSIS — I10 Essential (primary) hypertension: Secondary | ICD-10-CM

## 2017-12-04 DIAGNOSIS — H40023 Open angle with borderline findings, high risk, bilateral: Secondary | ICD-10-CM | POA: Diagnosis not present

## 2017-12-04 DIAGNOSIS — H43813 Vitreous degeneration, bilateral: Secondary | ICD-10-CM | POA: Diagnosis not present

## 2017-12-04 DIAGNOSIS — H2513 Age-related nuclear cataract, bilateral: Secondary | ICD-10-CM | POA: Diagnosis not present

## 2017-12-06 DIAGNOSIS — Z1231 Encounter for screening mammogram for malignant neoplasm of breast: Secondary | ICD-10-CM | POA: Diagnosis not present

## 2017-12-06 LAB — HM MAMMOGRAPHY

## 2017-12-10 ENCOUNTER — Encounter: Payer: Self-pay | Admitting: Family Medicine

## 2018-01-07 ENCOUNTER — Other Ambulatory Visit (HOSPITAL_COMMUNITY): Payer: Self-pay | Admitting: Chiropractic Medicine

## 2018-01-07 ENCOUNTER — Ambulatory Visit (HOSPITAL_COMMUNITY)
Admission: RE | Admit: 2018-01-07 | Discharge: 2018-01-07 | Disposition: A | Discharge status: Home / Self Care | Payer: Medicare Other | Source: Ambulatory Visit | Attending: Chiropractic Medicine | Admitting: Chiropractic Medicine

## 2018-01-07 DIAGNOSIS — M25562 Pain in left knee: Secondary | ICD-10-CM | POA: Insufficient documentation

## 2018-02-03 ENCOUNTER — Encounter: Payer: Self-pay | Admitting: Family Medicine

## 2018-02-03 ENCOUNTER — Ambulatory Visit (INDEPENDENT_AMBULATORY_CARE_PROVIDER_SITE_OTHER): Payer: Medicare Other | Admitting: Family Medicine

## 2018-02-03 VITALS — BP 130/82 | HR 95 | Temp 98.1°F | Resp 12 | Ht 64.0 in | Wt 196.4 lb

## 2018-02-03 DIAGNOSIS — M5442 Lumbago with sciatica, left side: Secondary | ICD-10-CM | POA: Diagnosis not present

## 2018-02-03 DIAGNOSIS — E876 Hypokalemia: Secondary | ICD-10-CM

## 2018-02-03 DIAGNOSIS — I10 Essential (primary) hypertension: Secondary | ICD-10-CM | POA: Diagnosis not present

## 2018-02-03 DIAGNOSIS — G8929 Other chronic pain: Secondary | ICD-10-CM | POA: Diagnosis not present

## 2018-02-03 DIAGNOSIS — R7301 Impaired fasting glucose: Secondary | ICD-10-CM | POA: Diagnosis not present

## 2018-02-03 DIAGNOSIS — Z0000 Encounter for general adult medical examination without abnormal findings: Secondary | ICD-10-CM

## 2018-02-03 DIAGNOSIS — E782 Mixed hyperlipidemia: Secondary | ICD-10-CM | POA: Diagnosis not present

## 2018-02-03 DIAGNOSIS — M5441 Lumbago with sciatica, right side: Secondary | ICD-10-CM | POA: Diagnosis not present

## 2018-02-03 DIAGNOSIS — Z23 Encounter for immunization: Secondary | ICD-10-CM | POA: Diagnosis not present

## 2018-02-03 LAB — LIPID PANEL
Cholesterol: 249 mg/dL — ABNORMAL HIGH (ref 0–200)
HDL: 54.4 mg/dL (ref >39.00)
LDL Cholesterol: 163 mg/dL — ABNORMAL HIGH (ref 0–99)
NonHDL: 194.4
Total CHOL/HDL Ratio: 5
Triglycerides: 155 mg/dL — ABNORMAL HIGH (ref 0.0–149.0)
VLDL: 31 mg/dL (ref 0.0–40.0)

## 2018-02-03 LAB — BASIC METABOLIC PANEL
BUN: 14 mg/dL (ref 6–23)
CO2: 28 mEq/L (ref 19–32)
Calcium: 9.4 mg/dL (ref 8.4–10.5)
Chloride: 104 mEq/L (ref 96–112)
Creatinine, Ser: 0.85 mg/dL (ref 0.40–1.20)
GFR: 86.16 mL/min (ref >60.00)
Glucose, Bld: 101 mg/dL — ABNORMAL HIGH (ref 70–99)
Potassium: 3.8 mEq/L (ref 3.5–5.1)
Sodium: 142 mEq/L (ref 135–145)

## 2018-02-03 LAB — HEMOGLOBIN A1C: Hgb A1c MFr Bld: 6.2 % (ref 4.6–6.5)

## 2018-02-03 MED ORDER — LOSARTAN POTASSIUM 100 MG PO TABS: ORAL_TABLET | ORAL | 2 refills | Status: DC

## 2018-02-03 MED ORDER — METOPROLOL SUCCINATE ER 100 MG PO TB24: ORAL_TABLET | ORAL | 2 refills | Status: DC

## 2018-02-03 MED ORDER — MELOXICAM 15 MG PO TABS: 15.0000 mg | ORAL_TABLET | Freq: Every day | ORAL | 2 refills | Status: DC

## 2018-02-03 NOTE — Assessment & Plan Note (Signed)
Continue K-Lor 10 mEq twice daily. Further recommendation will be given according to BMP results.

## 2018-02-03 NOTE — Progress Notes (Signed)
HPI:   Meghan Nichols is a 67 y.o. female, who is here today for 6 months follow up. She also would like her Medicare annual wellness visit done today.    She was last seen on 08/05/2017.  Hypertension:  Currently on Cozaar 100 mg daily, metoprolol succinate 100 mg daily. She is taking medications as instructed, no side effects reported.  She has not noted unusual headache, visual changes, exertional chest pain, dyspnea,  focal weakness, or edema.  HypoK+,she is on KLOR 10 meq bid.  No side effects reported.   Lab Results  Component Value Date   CREATININE 0.96 08/05/2017   BUN 13 08/05/2017   NA 143 08/05/2017   K 3.7 08/05/2017   CL 102 08/05/2017   CO2 31 08/05/2017    Lab Results  Component Value Date   HGBA1C 6.4 03/04/2017   Hyperlipidemia: Currently she is on nonpharmacologic treatment. Last lipid panel in 12/2015: HDL 50.2, LDL 79, TC 163, and TG 169.  OA and chronic back pain. Pain is radiated to LE's,intermittent numbness and tingling. She was on Gabapentin and Cymbalta but not did not tolerate meds well. She was on Tramadol but her health insurance recommended 3 months follow ups, she was not willing to do so. Mobic started, medication is helping with pain.  Pain is exacerbated by prolonged walking and standing. Alleviated by rest.   Medicare preventive visit: Last AWV was in 10/2015. CV risk factors: HTN,HLD,obesity,and prediabetes.  She is exercising regularly, walking 2 times per week. She tries to follow a healthful diet.  She lives with her husband.  Functional Status Survey: Is the patient deaf or have difficulty hearing?: Yes(Sometimes.) Does the patient have difficulty seeing, even when wearing glasses/contacts?: No Does the patient have difficulty concentrating, remembering, or making decisions?: No Does the patient have difficulty walking or climbing stairs?: Yes(History of OA and lower back pain.) Does the patient have  difficulty dressing or bathing?: No Does the patient have difficulty doing errands alone such as visiting a doctor's office or shopping?: No  Mild right hearing loss. She uses a cane for transfer, Hx of OA.  Fall Risk  02/03/2018 11/18/2015  Falls in the past year? 0 No  Number falls in past yr: 0 -  Injury with Fall? 0 -  Risk for fall due to : Impaired balance/gait -  Follow up Education provided -     Providers she sees regularly:  Eye care provider: Dr Sabino Donovan visit 05/2017. GI,Dr Elnoria Howard. Pap smear in 2011. S/P hysterectomy.  Depression screen [facility] 2/9 02/03/2018  Decreased Interest 0  Down, Depressed, Hopeless 0  PHQ - 2 Score 0    Mini-Cog - 02/03/18 1858    Normal clock drawing test?  yes    How many words correct?  3       Visual Acuity Screening   Right eye Left eye Both eyes  Without correction:     With correction: 20/30 20/30 20/30    Preventive: Colonoscopy: According to pt, 3 years ago. She is supposed to have colonoscopy every 5 years. Mammogram 12/06/17. DEXA:  Review of Systems  Constitutional: Negative for activity change, appetite change, fatigue and fever.  HENT: Negative for mouth sores, nosebleeds and trouble swallowing.   Eyes: Negative for redness and visual disturbance.  Respiratory: Negative for cough, shortness of breath and wheezing.   Cardiovascular: Negative for chest pain, palpitations and leg swelling.  Gastrointestinal: Negative for abdominal pain, nausea and vomiting.  Negative for changes in bowel habits.  Endocrine: Negative for polydipsia, polyphagia and polyuria.  Genitourinary: Negative for decreased urine volume, dysuria and hematuria.  Musculoskeletal: Positive for arthralgias, back pain and gait problem.  Neurological: Negative for syncope, weakness and headaches.     Current Outpatient Medications on File Prior to Visit  Medication Sig Dispense Refill  . Ascorbic Acid (VITAMIN C) 100 MG tablet Take 100 mg by  mouth daily.    Marland Kitchen. aspirin 81 MG tablet Take 81 mg by mouth daily.    Marland Kitchen. azelastine (ASTELIN) 0.1 % nasal spray Place 2 sprays into both nostrils 2 (two) times daily. Use in each nostril as directed 30 mL 3  . cholecalciferol (VITAMIN D) 1000 UNITS tablet Take 1,000 Units by mouth daily.    . fexofenadine (ALLEGRA ALLERGY) 180 MG tablet Take 1 tablet (180 mg total) by mouth daily. 90 tablet 3  . fluticasone (FLONASE) 50 MCG/ACT nasal spray Place 1 spray into both nostrils 2 (two) times daily. 16 g 3  . KLOR-CON M10 10 MEQ tablet TAKE 1 TABLET TWICE DAILY 180 tablet 1  . VITAMIN E PO Take 1 tablet by mouth daily.     No current facility-administered medications on file prior to visit.      Past Medical History:  Diagnosis Date  . High cholesterol   . Hypertension    Allergies  Allergen Reactions  . Penicillins Hives    Has patient had a PCN reaction causing immediate rash, facial/tongue/throat swelling, SOB or lightheadedness with hypotension: Yes Has patient had a PCN reaction causing severe rash involving mucus membranes or skin necrosis: Unknown Has patient had a PCN reaction that required hospitalization: No Has patient had a PCN reaction occurring within the last 10 years: No If all of the above answers are "NO", then may proceed with Cephalosporin use.     Social History   Socioeconomic History  . Marital status: Married    Spouse name: Not on file  . Number of children: Not on file  . Years of education: Not on file  . Highest education level: Not on file  Occupational History  . Not on file  Social Needs  . Financial resource strain: Not on file  . Food insecurity:    Worry: Not on file    Inability: Not on file  . Transportation needs:    Medical: Not on file    Non-medical: Not on file  Tobacco Use  . Smoking status: Never Smoker  . Smokeless tobacco: Never Used  Substance and Sexual Activity  . Alcohol use: No  . Drug use: No  . Sexual activity: Never   Lifestyle  . Physical activity:    Days per week: Not on file    Minutes per session: Not on file  . Stress: Not on file  Relationships  . Social connections:    Talks on phone: Not on file    Gets together: Not on file    Attends religious service: Not on file    Active member of club or organization: Not on file    Attends meetings of clubs or organizations: Not on file    Relationship status: Not on file  Other Topics Concern  . Not on file  Social History Narrative  . Not on file    Vitals:   02/03/18 0825  BP: 130/82  Pulse: 95  Resp: 12  Temp: 98.1 F (36.7 C)  SpO2: 95%   Body mass index is 33.71  kg/m.   Physical Exam  Nursing note and vitals reviewed. Constitutional: She is oriented to person, place, and time. She appears well-developed. No distress.  HENT:  Head: Normocephalic and atraumatic.  Right Ear: Decreased hearing (mild) is noted.  Mouth/Throat: Oropharynx is clear and moist and mucous membranes are normal. She has dentures.  Eyes: Pupils are equal, round, and reactive to light. Conjunctivae are normal.  Cardiovascular: Normal rate and regular rhythm.  No murmur heard. Pulses:      Dorsalis pedis pulses are 2+ on the right side and 2+ on the left side.  Respiratory: Effort normal and breath sounds normal. No respiratory distress.  GI: Soft. She exhibits no mass. There is no hepatomegaly. There is no abdominal tenderness.  Musculoskeletal:        General: No edema.     Thoracic back: She exhibits no tenderness and no bony tenderness.     Lumbar back: She exhibits tenderness. She exhibits no bony tenderness.       Back:  Lymphadenopathy:    She has no cervical adenopathy.  Neurological: She is alert and oriented to person, place, and time. She has normal strength. No cranial nerve deficit. Gait abnormal.  Gait assisted by a cane.  Skin: Skin is warm. No rash noted. No erythema.  Psychiatric: She has a normal mood and affect.  Well groomed,  good eye contact.     ASSESSMENT AND PLAN:   Meghan Nichols was seen today for 6 months follow-up and AWV.  Orders Placed This Encounter  Procedures  . Pneumococcal conjugate vaccine 13-valent IM  . Basic metabolic panel  . Hemoglobin A1c  . Lipid panel    Lab Results  Component Value Date   CHOL 249 (H) 02/03/2018   HDL 54.40 02/03/2018   LDLCALC 163 (H) 02/03/2018   LDLDIRECT 160.0 08/25/2015   TRIG 155.0 (H) 02/03/2018   CHOLHDL 5 02/03/2018   Lab Results  Component Value Date   CREATININE 0.85 02/03/2018   BUN 14 02/03/2018   NA 142 02/03/2018   K 3.8 02/03/2018   CL 104 02/03/2018   CO2 28 02/03/2018   Lab Results  Component Value Date   HGBA1C 6.2 02/03/2018     Medicare annual wellness visit, subsequent We discussed the importance of staying active, physically and mentally, as well as the benefits of a healthy/balance diet. Low impact exercise that involve stretching and strengthing are ideal. Vaccines updated.  Refused flu vaccine. We discussed preventive screening for the next 5-10 years, summery of recommendations given in AVS:  Colonoscopy due in 2021, having procedure done every 5 years due to polypectomy. Continue annual eye exam glaucoma screening. Annual diabetes screening. Fall prevention. DEXA: She prefers to have it done with mammogram in 2020.  Advance directives and end of life discussed, already arranged.  Need for vaccination against Streptococcus pneumoniae using pneumococcal conjugate vaccine 13 - Pneumococcal conjugate vaccine 13-valent IM  Essential hypertension, benign BP well controlled. Eye exam is current. Continue low-salt diet. No changes in current management. Follow-up in 6 months.  Hyperlipemia, mixed For now she will continue nonpharmacologic treatment. Further recommendation will be given according to lipid panel results as well as 10 years CVD risk.  Hypokalemia Continue K-Lor 10 mEq twice daily. Further  recommendation will be given according to BMP results.  IFG (impaired fasting glucose) Healthy lifestyle recommended for primary prevention. We discussed diagnostic criteria for DM2. We will continue following and further recommendation will be given according to A1c  results.  Chronic back pain Meloxicam 15 mg daily as needed it has pain with back and joint pain. No changes in current management. We discussed some side effects of chronic use of NSAIDs. Follow-up in 6 months.       G. [place], MD  [facility] [place] office.

## 2018-02-03 NOTE — Assessment & Plan Note (Signed)
BP well controlled. Eye exam is current. Continue low-salt diet. No changes in current management. Follow-up in 6 months.

## 2018-02-03 NOTE — Assessment & Plan Note (Signed)
Healthy lifestyle recommended for primary prevention. We discussed diagnostic criteria for DM2. We will continue following and further recommendation will be given according to A1c results.

## 2018-02-03 NOTE — Patient Instructions (Addendum)
A few things to remember from today's visit:   Essential hypertension, benign - Plan: losartan (COZAAR) 100 MG tablet, metoprolol succinate (TOPROL-XL) 100 MG 24 hr tablet  Medicare annual wellness visit, subsequent  Hyperlipemia, mixed  Hypokalemia  IFG (impaired fasting glucose)  Chronic bilateral low back pain with bilateral sciatica   A few tips:  -As we age balance is not as good as it was, so there is a higher risks for falls. Please remove small rugs and furniture that is "in your way" and could increase the risk of falls. Stretching exercises may help with fall prevention: Yoga and Tai Chi are some examples. Low impact exercise is better, so you are not very achy the next day.  -Sun screen and avoidance of direct sun light recommended. Caution with dehydration, if working outdoors be sure to drink enough fluids.  - Some medications are not safe as we age, increases the risk of side effects and can potentially interact with other medication you are also taken;  including some of over the counter medications. Be sure to let me know when you start a new medication even if it is a dietary/vitamin supplement.   -Healthy diet low in red meet/animal fat and sugar + regular physical activity is recommended.       Screening schedule for the next 5-10 years:  Colonoscopy in 2021  Glaucoma screening/eye exam every 1 year.  Mammogram for breast cancer screening annually. DEXA with next mammogram,12/2018. Flu vaccine annually.  Fall prevention        Please be sure medication list is accurate. If a new problem present, please set up appointment sooner than planned today.

## 2018-02-03 NOTE — Assessment & Plan Note (Signed)
Meloxicam 15 mg daily as needed it has pain with back and joint pain. No changes in current management. We discussed some side effects of chronic use of NSAIDs. Follow-up in 6 months.

## 2018-02-03 NOTE — Assessment & Plan Note (Signed)
For now she will continue nonpharmacologic treatment. Further recommendation will be given according to lipid panel results as well as 10 years CVD risk. 

## 2018-02-07 ENCOUNTER — Other Ambulatory Visit: Payer: Self-pay | Admitting: *Deleted

## 2018-02-07 DIAGNOSIS — J309 Allergic rhinitis, unspecified: Secondary | ICD-10-CM

## 2018-02-07 MED ORDER — FEXOFENADINE HCL 180 MG PO TABS: 180.0000 mg | ORAL_TABLET | Freq: Every day | ORAL | 3 refills | Status: DC

## 2018-02-07 MED ORDER — PRAVASTATIN SODIUM 20 MG PO TABS: 20.0000 mg | ORAL_TABLET | Freq: Every day | ORAL | 3 refills | Status: DC

## 2018-05-23 ENCOUNTER — Telehealth: Payer: Self-pay | Admitting: *Deleted

## 2018-05-23 NOTE — Telephone Encounter (Signed)
Copied from CRM [id number]. Topic: General - Other >> May 23, 2018 12:57 PM Laural Benes, [place] C wrote: Reason for CRM: pt called in to be advised. Pt says that she was told by the pharmacy that she doesn't have refills on losartan (COZAAR) 100 MG tablet .  Pt says that on her bottle its showing refills. Pt says that she called in refill to pharmacy on Monday, and has taken her last pill today.   Pt would like further assistance.    Pharmacy.

## 2018-05-26 ENCOUNTER — Other Ambulatory Visit: Payer: Self-pay

## 2018-05-26 DIAGNOSIS — I10 Essential (primary) hypertension: Secondary | ICD-10-CM

## 2018-05-26 MED ORDER — LOSARTAN POTASSIUM 100 MG PO TABS: ORAL_TABLET | ORAL | 0 refills | Status: DC

## 2018-05-26 NOTE — Telephone Encounter (Signed)
Rx sent into pharmacy by Sabino Snipes., CMA. Nothing further needed at this time.

## 2018-05-29 ENCOUNTER — Other Ambulatory Visit: Payer: Self-pay | Admitting: *Deleted

## 2018-05-29 MED ORDER — LOSARTAN POTASSIUM 50 MG PO TABS: ORAL_TABLET | ORAL | 1 refills | Status: DC

## 2018-08-04 ENCOUNTER — Encounter: Payer: Self-pay | Admitting: Family Medicine

## 2018-08-04 ENCOUNTER — Other Ambulatory Visit: Payer: Self-pay

## 2018-08-04 ENCOUNTER — Ambulatory Visit (INDEPENDENT_AMBULATORY_CARE_PROVIDER_SITE_OTHER): Payer: Medicare Other | Admitting: Family Medicine

## 2018-08-04 VITALS — BP 162/116 | HR 102 | Temp 98.7°F | Resp 16 | Wt 199.0 lb

## 2018-08-04 DIAGNOSIS — R7303 Prediabetes: Secondary | ICD-10-CM | POA: Diagnosis not present

## 2018-08-04 DIAGNOSIS — E782 Mixed hyperlipidemia: Secondary | ICD-10-CM

## 2018-08-04 DIAGNOSIS — I10 Essential (primary) hypertension: Secondary | ICD-10-CM | POA: Diagnosis not present

## 2018-08-04 DIAGNOSIS — E876 Hypokalemia: Secondary | ICD-10-CM | POA: Diagnosis not present

## 2018-08-04 DIAGNOSIS — M25552 Pain in left hip: Secondary | ICD-10-CM

## 2018-08-04 LAB — BASIC METABOLIC PANEL
BUN: 16 mg/dL (ref 6–23)
CO2: 29 mEq/L (ref 19–32)
Calcium: 9.1 mg/dL (ref 8.4–10.5)
Chloride: 105 mEq/L (ref 96–112)
Creatinine, Ser: 0.92 mg/dL (ref 0.40–1.20)
GFR: 73.88 mL/min (ref >60.00)
Glucose, Bld: 82 mg/dL (ref 70–99)
Potassium: 3.1 mEq/L — ABNORMAL LOW (ref 3.5–5.1)
Sodium: 144 mEq/L (ref 135–145)

## 2018-08-04 LAB — HEMOGLOBIN A1C: Hgb A1c MFr Bld: 6.3 % (ref 4.6–6.5)

## 2018-08-04 LAB — LIPID PANEL
Cholesterol: 185 mg/dL (ref 0–200)
HDL: 46.5 mg/dL (ref >39.00)
LDL Cholesterol: 105 mg/dL — ABNORMAL HIGH (ref 0–99)
NonHDL: 138.81
Total CHOL/HDL Ratio: 4
Triglycerides: 171 mg/dL — ABNORMAL HIGH (ref 0.0–149.0)
VLDL: 34.2 mg/dL (ref 0.0–40.0)

## 2018-08-04 NOTE — Assessment & Plan Note (Signed)
A healthy lifestyle recommended for primary prevention. Further recommendation will be given according to lab results.

## 2018-08-04 NOTE — Patient Instructions (Signed)
A few things to remember from today's visit:   Essential hypertension, benign - Plan: Basic metabolic panel  Hyperlipemia, mixed - Plan: Lipid panel  Hypokalemia  Prediabetes - Plan: Hemoglobin A1c  Left hip pain  No changes on your medications. Please contact your orthopedist, so MRI of knee and hip can be done.  Continue monitoring your blood pressure heart rate.  Please be sure medication list is accurate. If a new problem present, please set up appointment sooner than planned today.
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
## 2018-08-04 NOTE — Assessment & Plan Note (Signed)
No changes in current management, will follow labs done today and will give further recommendations accordingly.  

## 2018-08-04 NOTE — Assessment & Plan Note (Addendum)
Blood pressure is not well controlled. We discussed possible complications of elevated BP. She is not interested in adding any medication, afraid of side effects. Recommend monitoring BP daily and to let me know about BP readings and 10 to 14 days. Continue low-salt diet Instructed about warning signs.

## 2018-08-06 DIAGNOSIS — H40023 Open angle with borderline findings, high risk, bilateral: Secondary | ICD-10-CM | POA: Diagnosis not present

## 2018-08-06 DIAGNOSIS — H2513 Age-related nuclear cataract, bilateral: Secondary | ICD-10-CM | POA: Diagnosis not present

## 2018-08-06 DIAGNOSIS — H43393 Other vitreous opacities, bilateral: Secondary | ICD-10-CM | POA: Diagnosis not present

## 2018-08-06 DIAGNOSIS — H43813 Vitreous degeneration, bilateral: Secondary | ICD-10-CM | POA: Diagnosis not present

## 2018-08-07 MED ORDER — POTASSIUM CHLORIDE CRYS ER 20 MEQ PO TBCR: 20.0000 meq | EXTENDED_RELEASE_TABLET | Freq: Two times a day (BID) | ORAL | 3 refills | Status: DC

## 2018-08-12 ENCOUNTER — Other Ambulatory Visit: Payer: Self-pay | Admitting: Family Medicine

## 2018-08-12 DIAGNOSIS — I10 Essential (primary) hypertension: Secondary | ICD-10-CM

## 2018-08-31 ENCOUNTER — Other Ambulatory Visit: Payer: Self-pay | Admitting: Family Medicine

## 2018-09-18 IMAGING — CT CT HEAD W/O CM
3 series · 15 of 47 positions shown, 18 images · non-contrast
Comparison: 08/15/2007

CLINICAL DATA: Worsening headache.

EXAM:
CT HEAD WITHOUT CONTRAST
TECHNIQUE: Contiguous axial images were obtained from the base of the skull
through the vertex without intravenous contrast.

[Series 3: head 5.0 h30s · axial · 0.39mm/px · z∈[-82,+43]mm · 9 of 31 slices shown, 12 images]
[im 3/31  brain]
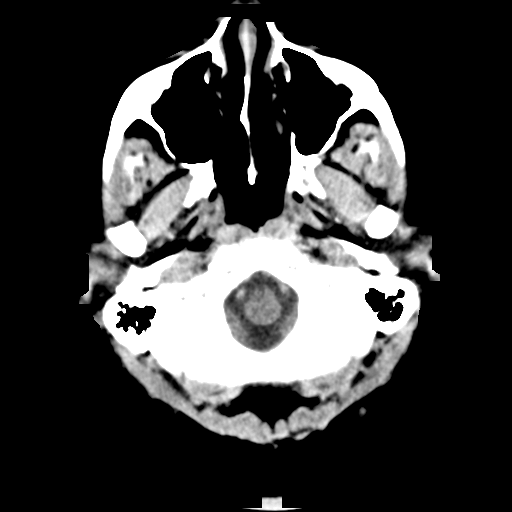
[im 3/31  bone]
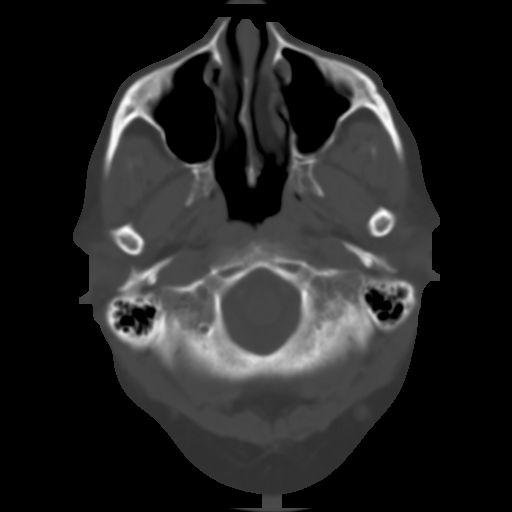
[im 6/31  brain]
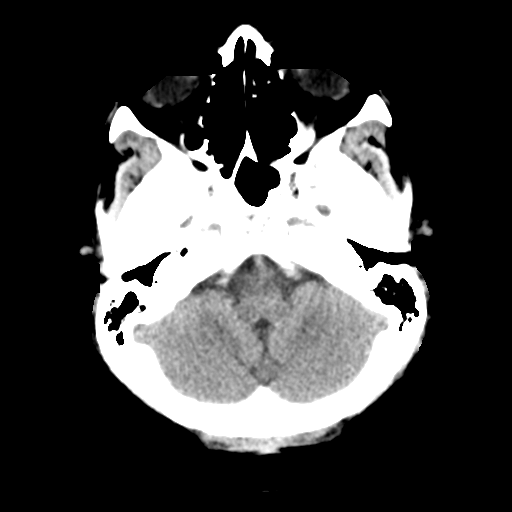
[im 9/31  brain]
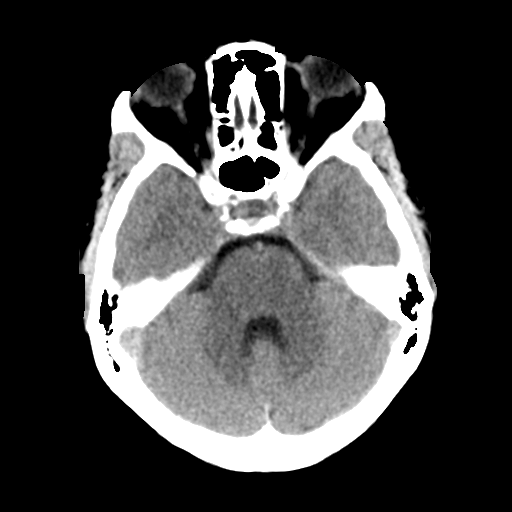
[im 12/31  brain]
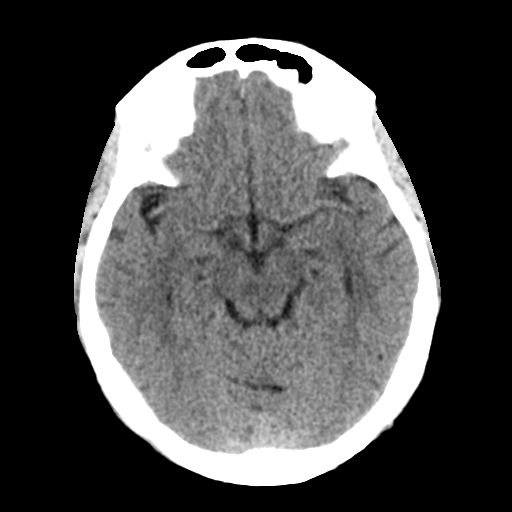
[im 16/31  brain]
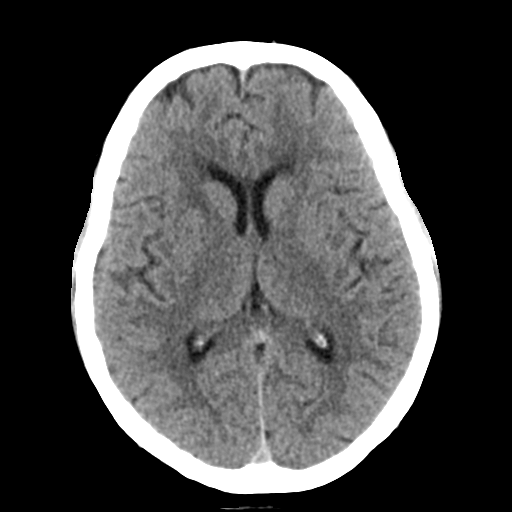
[im 16/31  bone]
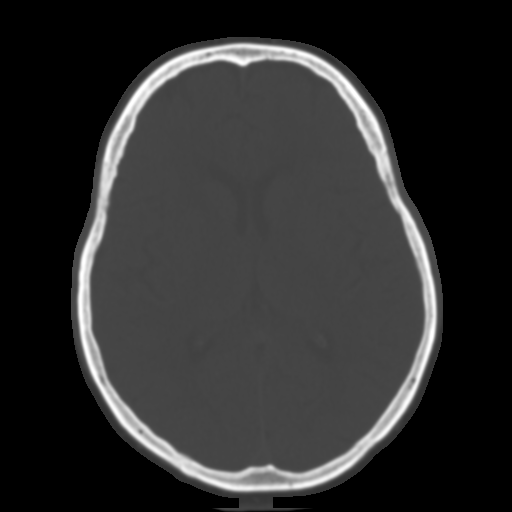
[im 19/31  brain]
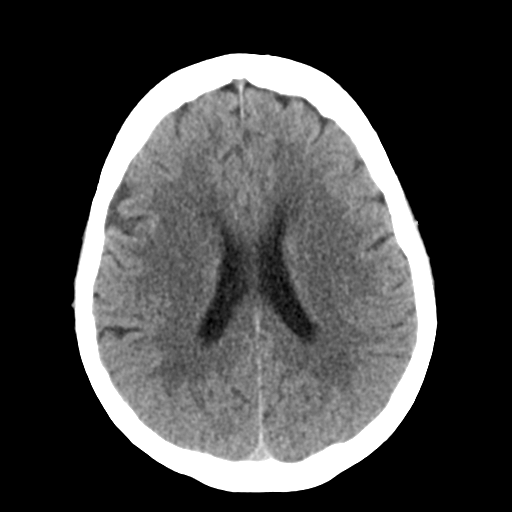
[im 22/31  brain]
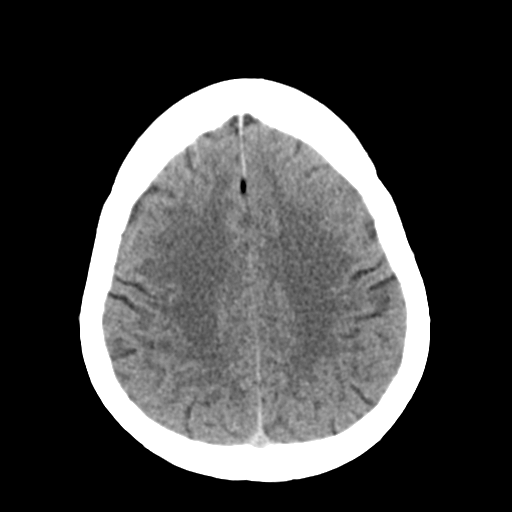
[im 25/31  brain]
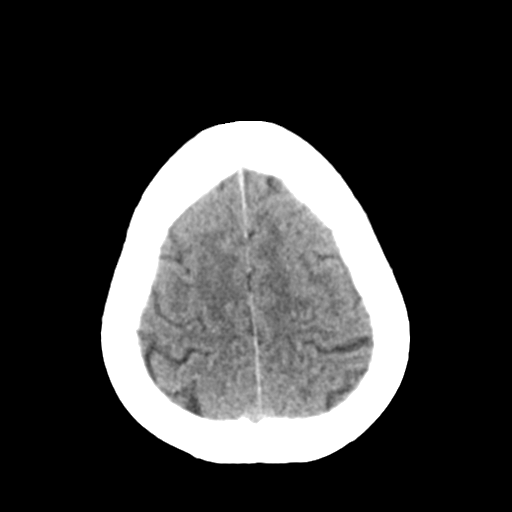
[im 28/31  brain]
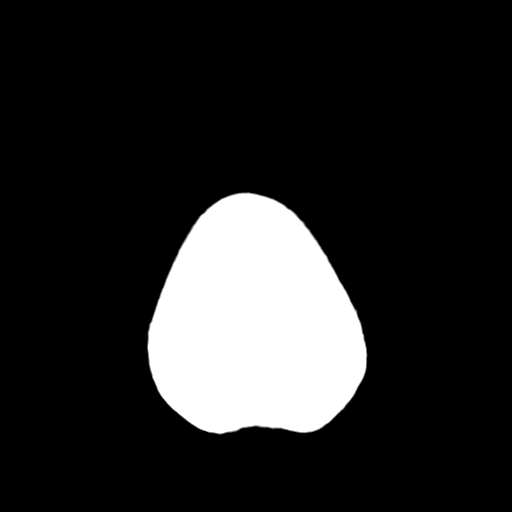
[im 28/31  bone]
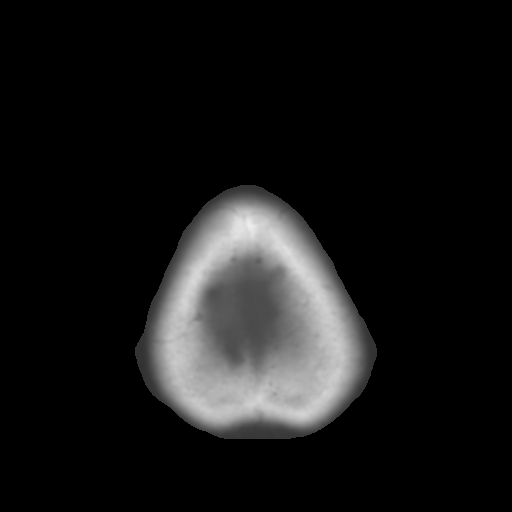

[Series 5: head 3.0 mpr cor · coronal · 0.30mm/px · 3 of 67 slices shown]
[im 23/67  brain]
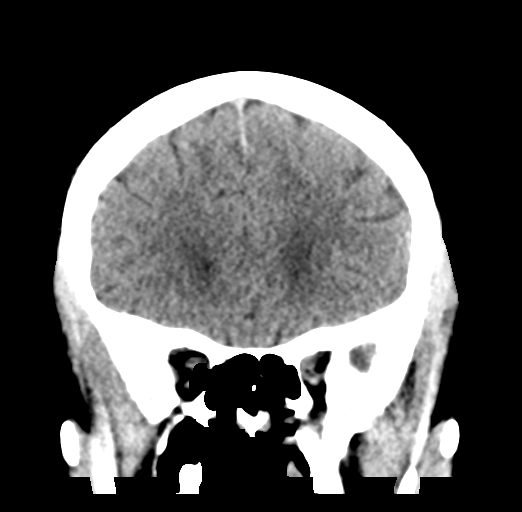
[im 30/67  brain]
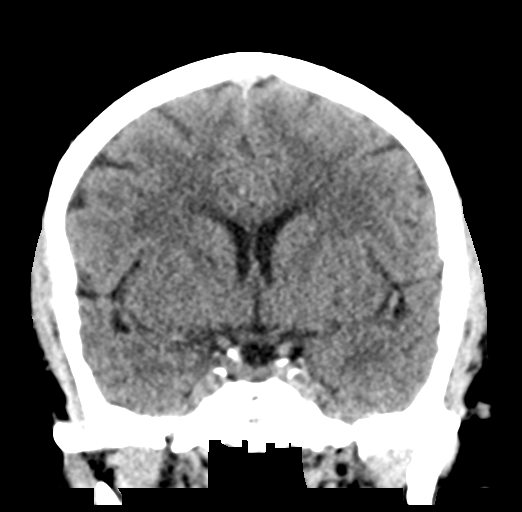
[im 37/67  brain]
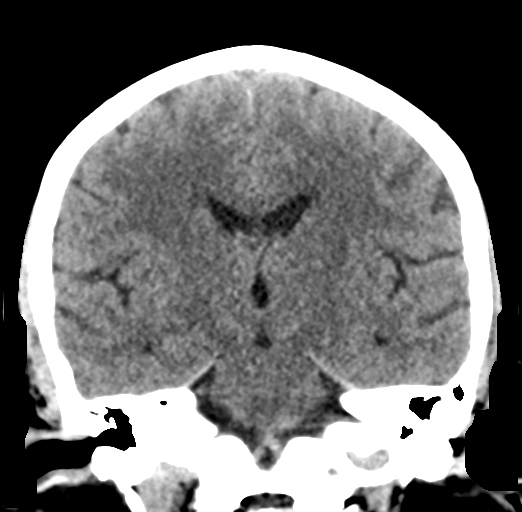

[Series 6: head 3.0 mpr sag · sagittal · 0.30mm/px · 3 of 67 slices shown]
[im 23/67  brain]
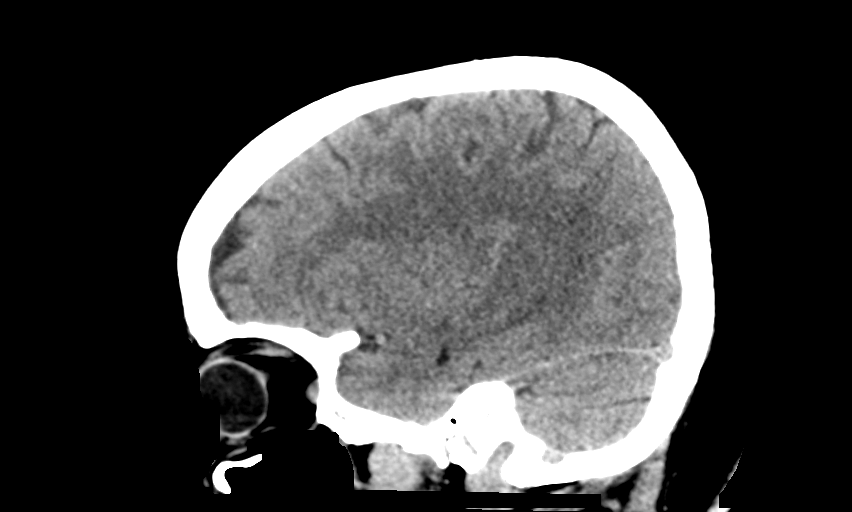
[im 34/67  brain]
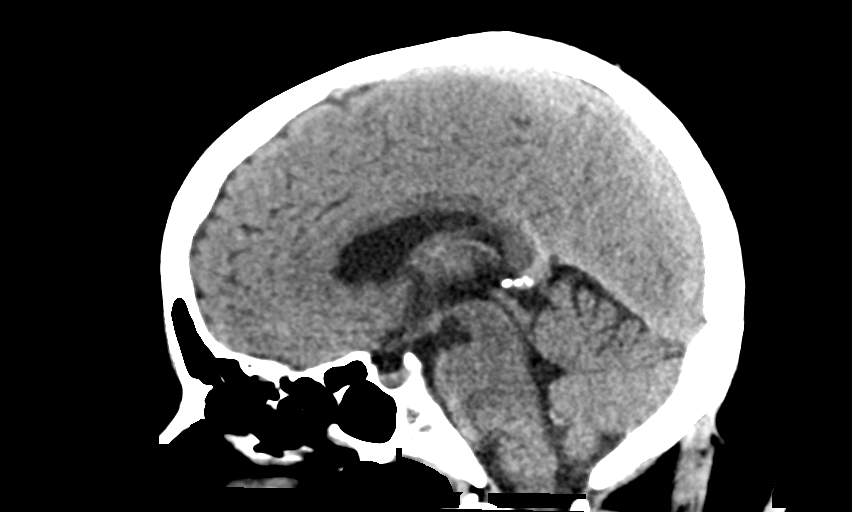
[im 45/67  brain]
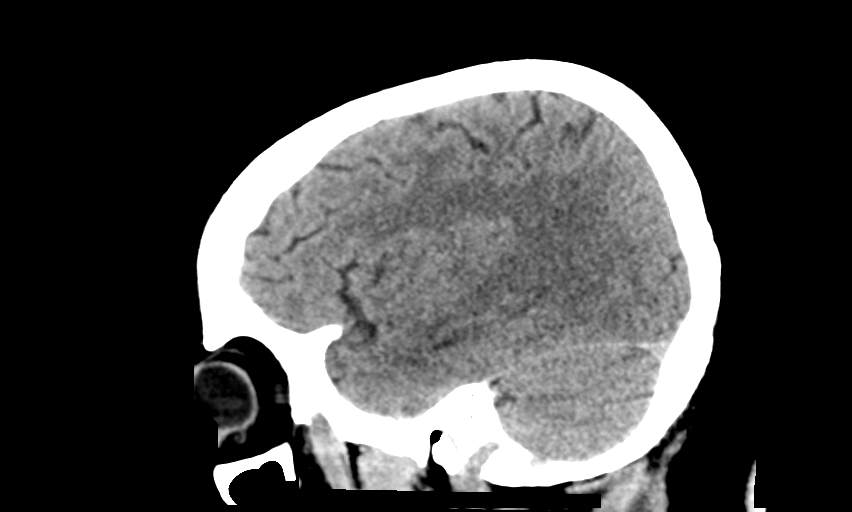

[15 of 47 positions shown; findings below may reference images not displayed]

FINDINGS: Brain: No evidence of acute infarction, hemorrhage, hydrocephalus,
extra-axial collection or mass lesion/mass effect. Mild
periventricular microangiopathy.

Vascular: Calcific atherosclerotic disease at the skullbase.

Skull: Normal. Negative for fracture or focal lesion.

Sinuses/Orbits: No acute finding.

Other: None.
IMPRESSION: No acute intracranial abnormality.

Mild periventricular chronic microvascular disease.

## 2018-10-09 DIAGNOSIS — H2513 Age-related nuclear cataract, bilateral: Secondary | ICD-10-CM | POA: Diagnosis not present

## 2018-10-09 DIAGNOSIS — H401134 Primary open-angle glaucoma, bilateral, indeterminate stage: Secondary | ICD-10-CM | POA: Diagnosis not present

## 2018-10-09 DIAGNOSIS — H43393 Other vitreous opacities, bilateral: Secondary | ICD-10-CM | POA: Diagnosis not present

## 2018-10-09 DIAGNOSIS — H43813 Vitreous degeneration, bilateral: Secondary | ICD-10-CM | POA: Diagnosis not present

## 2018-11-01 ENCOUNTER — Other Ambulatory Visit: Payer: Self-pay | Admitting: Family Medicine

## 2018-11-01 DIAGNOSIS — E876 Hypokalemia: Secondary | ICD-10-CM

## 2018-11-13 DIAGNOSIS — H35033 Hypertensive retinopathy, bilateral: Secondary | ICD-10-CM | POA: Diagnosis not present

## 2018-11-13 DIAGNOSIS — H3562 Retinal hemorrhage, left eye: Secondary | ICD-10-CM | POA: Diagnosis not present

## 2018-11-13 DIAGNOSIS — H40023 Open angle with borderline findings, high risk, bilateral: Secondary | ICD-10-CM | POA: Diagnosis not present

## 2018-11-13 DIAGNOSIS — H35363 Drusen (degenerative) of macula, bilateral: Secondary | ICD-10-CM | POA: Diagnosis not present

## 2018-11-17 ENCOUNTER — Other Ambulatory Visit: Payer: Self-pay

## 2018-11-17 ENCOUNTER — Encounter (INDEPENDENT_AMBULATORY_CARE_PROVIDER_SITE_OTHER): Payer: Medicare Other | Admitting: Ophthalmology

## 2018-11-17 DIAGNOSIS — H43813 Vitreous degeneration, bilateral: Secondary | ICD-10-CM

## 2018-11-17 DIAGNOSIS — H353132 Nonexudative age-related macular degeneration, bilateral, intermediate dry stage: Secondary | ICD-10-CM

## 2018-11-17 DIAGNOSIS — H348322 Tributary (branch) retinal vein occlusion, left eye, stable: Secondary | ICD-10-CM

## 2018-11-17 DIAGNOSIS — I10 Essential (primary) hypertension: Secondary | ICD-10-CM

## 2018-11-17 DIAGNOSIS — H2513 Age-related nuclear cataract, bilateral: Secondary | ICD-10-CM

## 2018-11-17 DIAGNOSIS — H35033 Hypertensive retinopathy, bilateral: Secondary | ICD-10-CM | POA: Diagnosis not present

## 2018-11-18 ENCOUNTER — Other Ambulatory Visit: Payer: Self-pay | Admitting: Family Medicine

## 2018-11-18 NOTE — Telephone Encounter (Signed)
Patient is requesting a refill on mobic. Sent on 02/03/2018 #90 x 2 RF. LOV 08/04/18 NOV 02/02/2019. Please advise. Thank you

## 2018-11-30 ENCOUNTER — Other Ambulatory Visit: Payer: Self-pay | Admitting: Family Medicine

## 2018-11-30 DIAGNOSIS — I10 Essential (primary) hypertension: Secondary | ICD-10-CM

## 2019-02-02 ENCOUNTER — Other Ambulatory Visit: Payer: Self-pay

## 2019-02-02 ENCOUNTER — Encounter: Payer: Self-pay | Admitting: Family Medicine

## 2019-02-02 ENCOUNTER — Ambulatory Visit (INDEPENDENT_AMBULATORY_CARE_PROVIDER_SITE_OTHER): Payer: Medicare Other | Admitting: Family Medicine

## 2019-02-02 VITALS — BP 136/80 | HR 97 | Resp 16 | Ht 64.0 in | Wt 200.5 lb

## 2019-02-02 DIAGNOSIS — E669 Obesity, unspecified: Secondary | ICD-10-CM

## 2019-02-02 DIAGNOSIS — E876 Hypokalemia: Secondary | ICD-10-CM | POA: Diagnosis not present

## 2019-02-02 DIAGNOSIS — G8929 Other chronic pain: Secondary | ICD-10-CM

## 2019-02-02 DIAGNOSIS — R7303 Prediabetes: Secondary | ICD-10-CM | POA: Diagnosis not present

## 2019-02-02 DIAGNOSIS — J309 Allergic rhinitis, unspecified: Secondary | ICD-10-CM | POA: Diagnosis not present

## 2019-02-02 DIAGNOSIS — I10 Essential (primary) hypertension: Secondary | ICD-10-CM

## 2019-02-02 DIAGNOSIS — M5442 Lumbago with sciatica, left side: Secondary | ICD-10-CM

## 2019-02-02 DIAGNOSIS — Z6833 Body mass index (BMI) 33.0-33.9, adult: Secondary | ICD-10-CM

## 2019-02-02 DIAGNOSIS — M5441 Lumbago with sciatica, right side: Secondary | ICD-10-CM

## 2019-02-02 LAB — COMPREHENSIVE METABOLIC PANEL
ALT: 17 U/L (ref 0–35)
AST: 21 U/L (ref 0–37)
Albumin: 4.3 g/dL (ref 3.5–5.2)
Alkaline Phosphatase: 95 U/L (ref 39–117)
BUN: 15 mg/dL (ref 6–23)
CO2: 28 mEq/L (ref 19–32)
Calcium: 9.2 mg/dL (ref 8.4–10.5)
Chloride: 106 mEq/L (ref 96–112)
Creatinine, Ser: 0.91 mg/dL (ref 0.40–1.20)
GFR: 74.7 mL/min (ref >60.00)
Glucose, Bld: 98 mg/dL (ref 70–99)
Potassium: 4 mEq/L (ref 3.5–5.1)
Sodium: 144 mEq/L (ref 135–145)
Total Bilirubin: 0.3 mg/dL (ref 0.2–1.2)
Total Protein: 8 g/dL (ref 6.0–8.3)

## 2019-02-02 LAB — HEMOGLOBIN A1C: Hgb A1c MFr Bld: 6.1 % (ref 4.6–6.5)

## 2019-02-02 MED ORDER — FEXOFENADINE HCL 180 MG PO TABS: 180.0000 mg | ORAL_TABLET | Freq: Every day | ORAL | 3 refills | Status: DC

## 2019-02-02 MED ORDER — LOSARTAN POTASSIUM 100 MG PO TABS: ORAL_TABLET | ORAL | 2 refills | Status: DC

## 2019-02-02 NOTE — Assessment & Plan Note (Signed)
Continue meloxicam 15 mg daily as needed. She is aware of side effects of chronic NSAID use. We will continue monitoring renal function and BP regularly.

## 2019-02-02 NOTE — Assessment & Plan Note (Signed)
A healthy lifestyle recommended for primary prevention. Further recommendation will be given according to A1c results.

## 2019-02-02 NOTE — Progress Notes (Signed)
HPI:   Meghan Nichols is a 68 y.o. female, who is here today for chronic disease management.  She was last seen on 08/04/18. No new problems sine her last OV. She has seen her eye care provider since her last visit.  Hypertension:She is on Metoprolol Succinate 100 mg daily and Losartan 100 mg daily. She is not checking BP at home. Denies severe/frequent headache, visual changes, chest pain, dyspnea, palpitation, claudication, focal weakness, or edema.  Lab Results  Component Value Date   CREATININE 0.92 08/04/2018   BUN 16 08/04/2018   NA 144 08/04/2018   K 3.1 (L) 08/04/2018   CL 105 08/04/2018   CO2 29 08/04/2018   Hypokalemia: Currently she is on KLOR 20 meq bid.  She is reporting on having a abnormal mammogram in 11/2018.  According to patient, further studies were recommended but she refused.  She would like for me to review mammogram first, I have no report at this time.  She has not been exercising regularly, she likes to walk outdoors, afraid of dogs in the neighborhood. She is trying to follow a healthful diet.  Back pain and hip OA: Currently she is on meloxicam 15 mg daily as needed. Back pain is sometimes radiated to LE's, burning and numbness. No saddle anesthesia or changes in bladder/bowell function.  Medication helps. No side effects reported.  Prediabetes: Negative for polydipsia,polyuria, or polyphagia.  Lab Results  Component Value Date   HGBA1C 6.3 08/04/2018    Review of Systems  Constitutional: Negative for activity change, appetite change, fatigue and fever.  HENT: Negative for mouth sores, nosebleeds and trouble swallowing.   Respiratory: Negative for cough and wheezing.   Gastrointestinal: Negative for abdominal pain, nausea and vomiting.       Negative for changes in bowel habits.  Genitourinary: Negative for decreased urine volume and hematuria.  Skin: Negative for rash and wound.  Neurological: Negative for syncope, facial  asymmetry and speech difficulty.  Rest of ROS, see pertinent positives sand negatives in HPI   Current Outpatient Medications on File Prior to Visit  Medication Sig Dispense Refill  . Ascorbic Acid (VITAMIN C) 100 MG tablet Take 100 mg by mouth daily.    Marland Kitchen. aspirin 81 MG tablet Take 81 mg by mouth daily.    Marland Kitchen. azelastine (ASTELIN) 0.1 % nasal spray Place 2 sprays into both nostrils 2 (two) times daily. Use in each nostril as directed 30 mL 3  . cholecalciferol (VITAMIN D) 1000 UNITS tablet Take 1,000 Units by mouth daily.    . fluticasone (FLONASE) 50 MCG/ACT nasal spray Place 1 spray into both nostrils 2 (two) times daily. 16 g 3  . KLOR-CON M20 20 MEQ tablet TAKE 1 TABLET BY MOUTH TWICE A DAY 180 tablet 0  . meloxicam (MOBIC) 15 MG tablet TAKE 1 TABLET BY MOUTH EVERY DAY 90 tablet 1  . metoprolol succinate (TOPROL-XL) 100 MG 24 hr tablet TAKE 1 TABLET EVERY DAY WITH A MEAL 90 tablet 2  . pravastatin (PRAVACHOL) 20 MG tablet Take 1 tablet (20 mg total) by mouth daily. 90 tablet 3  . VITAMIN E PO Take 1 tablet by mouth daily.     No current facility-administered medications on file prior to visit.    Past Medical History:  Diagnosis Date  . High cholesterol   . Hypertension    Allergies  Allergen Reactions  . Penicillins Hives    Has patient had a PCN reaction causing immediate rash,  facial/tongue/throat swelling, SOB or lightheadedness with hypotension: Yes Has patient had a PCN reaction causing severe rash involving mucus membranes or skin necrosis: Unknown Has patient had a PCN reaction that required hospitalization: No Has patient had a PCN reaction occurring within the last 10 years: No If all of the above answers are "NO", then may proceed with Cephalosporin use.     Social History   Socioeconomic History  . Marital status: Married    Spouse name: Not on file  . Number of children: Not on file  . Years of education: Not on file  . Highest education level: Not on file   Occupational History  . Not on file  Tobacco Use  . Smoking status: Never Smoker  . Smokeless tobacco: Never Used  Substance and Sexual Activity  . Alcohol use: No  . Drug use: No  . Sexual activity: Never  Other Topics Concern  . Not on file  Social History Narrative  . Not on file   Social Determinants of Health   Financial Resource Strain:   . Difficulty of Paying Living Expenses: Not on file  Food Insecurity:   . Worried About Charity fundraiser in the Last Year: Not on file  . Ran Out of Food in the Last Year: Not on file  Transportation Needs:   . Lack of Transportation (Medical): Not on file  . Lack of Transportation (Non-Medical): Not on file  Physical Activity:   . Days of Exercise per Week: Not on file  . Minutes of Exercise per Session: Not on file  Stress:   . Feeling of Stress : Not on file  Social Connections:   . Frequency of Communication with Friends and Family: Not on file  . Frequency of Social Gatherings with Friends and Family: Not on file  . Attends Religious Services: Not on file  . Active Member of Clubs or Organizations: Not on file  . Attends Archivist Meetings: Not on file  . Marital Status: Not on file    Vitals:   02/02/19 0843  BP: 136/80  Pulse: 97  Resp: 16  SpO2: 98%   Body mass index is 34.42 kg/m.  Wt Readings from Last 3 Encounters:  02/02/19 200 lb 8 oz (90.9 kg)  08/04/18 199 lb (90.3 kg)  02/03/18 196 lb 6 oz (89.1 kg)    Physical Exam  Nursing note and vitals reviewed. Constitutional: She is oriented to person, place, and time. She appears well-developed. No distress.  HENT:  Head: Normocephalic and atraumatic.  Mouth/Throat: Oropharynx is clear and moist and mucous membranes are normal.  Eyes: Pupils are equal, round, and reactive to light. Conjunctivae are normal.  Cardiovascular: Normal rate and regular rhythm.  No murmur heard. DP present bilateral.  Respiratory: Effort normal and breath sounds  normal. No respiratory distress.  GI: Soft. She exhibits no mass. There is no hepatomegaly. There is no abdominal tenderness.  Musculoskeletal:        General: No edema.     Lumbar back: No tenderness or bony tenderness.     Comments: Antalgic gait.  Lymphadenopathy:    She has no cervical adenopathy.  Neurological: She is alert and oriented to person, place, and time. She has normal strength. No cranial nerve deficit.  Cane for assistance.  Skin: Skin is warm. No rash noted. No erythema.  Psychiatric: She has a normal mood and affect.  Well groomed, good eye contact.    ASSESSMENT AND PLAN:  Meghan Nichols was seen today for chronic disease management.  Orders Placed This Encounter  Procedures  . CMP  . Hemoglobin A1c   Lab Results  Component Value Date   HGBA1C 6.1 02/02/2019   Lab Results  Component Value Date   CREATININE 0.91 02/02/2019   BUN 15 02/02/2019   NA 144 02/02/2019   K 4.0 02/02/2019   CL 106 02/02/2019   CO2 28 02/02/2019   Lab Results  Component Value Date   ALT 17 02/02/2019   AST 21 02/02/2019   ALKPHOS 95 02/02/2019   BILITOT 0.3 02/02/2019    Essential hypertension, benign Problem is adequately controlled. Continue losartan 100 mg daily and metoprolol succinate 100 mg daily. Continue low-salt diet. If positive for monitor BP at home. Eye exam is current.  Allergic rhinitis Problem is adequately controlled with Allegra 180 mg daily, Flonase nasal spray daily as needed, and Astelin nasal spray. No changes in current management.  Chronic bilateral low back pain with bilateral sciatica Continue meloxicam 15 mg daily as needed. She is aware of side effects of chronic NSAID use. We will continue monitoring renal function and BP regularly.  Hypokalemia Continue K-Lor 20 mEq twice daily. Further recommendation will be given according to lab results.  Class 1 obesity with body mass index (BMI) of 33.0 to 33.9 in adult Benefits of  weight loss discussed. Any type of regular physical activity, even walking in place at home for 10 to 15 minutes daily will help. Continue a healthful diet.   Prediabetes A healthy lifestyle recommended for primary prevention. Further recommendation will be given according to A1c results.    Return in about 6 months (around 08/03/2019).        -Ms. Rosalyn Archambault was advised to return sooner than planned today if new concerns arise.       Mayra Brahm G. [place], MD  [facility]. [place] office.

## 2019-02-02 NOTE — Assessment & Plan Note (Signed)
Problem is adequately controlled. Continue losartan 100 mg daily and metoprolol succinate 100 mg daily. Continue low-salt diet. If positive for monitor BP at home. Eye exam is current.

## 2019-02-02 NOTE — Patient Instructions (Signed)
A few things to remember from today's visit:   Prediabetes - Plan: Hemoglobin A1c  Essential hypertension, benign - Plan: losartan (COZAAR) 100 MG tablet, CMP  Allergic rhinitis, unspecified seasonality, unspecified trigger - Plan: fexofenadine (ALLEGRA ALLERGY) 180 MG tablet  Hypokalemia  No changes today. Pending mammogram,please sign a release form.  Please be sure medication list is accurate. If a new problem present, please set up appointment sooner than planned today.

## 2019-02-02 NOTE — Assessment & Plan Note (Signed)
Continue K-Lor 20 mEq twice daily. Further recommendation will be given according to lab results.

## 2019-02-02 NOTE — Assessment & Plan Note (Signed)
Problem is adequately controlled with Allegra 180 mg daily, Flonase nasal spray daily as needed, and Astelin nasal spray. No changes in current management.

## 2019-02-02 NOTE — Assessment & Plan Note (Signed)
Benefits of weight loss discussed. Any type of regular physical activity, even walking in place at home for 10 to 15 minutes daily will help. Continue a healthful diet.

## 2019-02-05 ENCOUNTER — Other Ambulatory Visit: Payer: Self-pay | Admitting: Family Medicine

## 2019-02-05 DIAGNOSIS — E876 Hypokalemia: Secondary | ICD-10-CM

## 2019-02-05 MED ORDER — PRAVASTATIN SODIUM 20 MG PO TABS: 20.0000 mg | ORAL_TABLET | Freq: Every day | ORAL | 3 refills | Status: DC

## 2019-02-17 ENCOUNTER — Other Ambulatory Visit: Payer: Self-pay | Admitting: Family Medicine

## 2019-02-24 ENCOUNTER — Encounter: Payer: Self-pay | Admitting: Family Medicine

## 2019-03-12 DIAGNOSIS — R928 Other abnormal and inconclusive findings on diagnostic imaging of breast: Secondary | ICD-10-CM | POA: Diagnosis not present

## 2019-03-12 DIAGNOSIS — N6489 Other specified disorders of breast: Secondary | ICD-10-CM | POA: Diagnosis not present

## 2019-05-15 ENCOUNTER — Other Ambulatory Visit: Payer: Self-pay | Admitting: Family Medicine

## 2019-05-25 ENCOUNTER — Encounter (INDEPENDENT_AMBULATORY_CARE_PROVIDER_SITE_OTHER): Payer: Medicare Other | Admitting: Ophthalmology

## 2019-06-05 ENCOUNTER — Encounter (INDEPENDENT_AMBULATORY_CARE_PROVIDER_SITE_OTHER): Payer: Medicare Other | Admitting: Ophthalmology

## 2019-06-05 DIAGNOSIS — H348322 Tributary (branch) retinal vein occlusion, left eye, stable: Secondary | ICD-10-CM

## 2019-06-05 DIAGNOSIS — H43813 Vitreous degeneration, bilateral: Secondary | ICD-10-CM | POA: Diagnosis not present

## 2019-06-05 DIAGNOSIS — H35033 Hypertensive retinopathy, bilateral: Secondary | ICD-10-CM | POA: Diagnosis not present

## 2019-06-05 DIAGNOSIS — H353131 Nonexudative age-related macular degeneration, bilateral, early dry stage: Secondary | ICD-10-CM | POA: Diagnosis not present

## 2019-06-05 DIAGNOSIS — I10 Essential (primary) hypertension: Secondary | ICD-10-CM | POA: Diagnosis not present

## 2019-06-22 DIAGNOSIS — K5730 Diverticulosis of large intestine without perforation or abscess without bleeding: Secondary | ICD-10-CM | POA: Diagnosis not present

## 2019-06-22 DIAGNOSIS — I10 Essential (primary) hypertension: Secondary | ICD-10-CM | POA: Diagnosis not present

## 2019-06-22 DIAGNOSIS — Z86010 Personal history of colonic polyps: Secondary | ICD-10-CM | POA: Diagnosis not present

## 2019-07-31 ENCOUNTER — Other Ambulatory Visit: Payer: Self-pay

## 2019-08-03 ENCOUNTER — Ambulatory Visit (INDEPENDENT_AMBULATORY_CARE_PROVIDER_SITE_OTHER): Payer: Medicare Other | Admitting: Family Medicine

## 2019-08-03 ENCOUNTER — Encounter: Payer: Self-pay | Admitting: Family Medicine

## 2019-08-03 ENCOUNTER — Other Ambulatory Visit: Payer: Self-pay

## 2019-08-03 VITALS — BP 132/76 | HR 96 | Temp 97.2°F | Resp 16 | Ht 64.0 in | Wt 197.4 lb

## 2019-08-03 DIAGNOSIS — I10 Essential (primary) hypertension: Secondary | ICD-10-CM | POA: Diagnosis not present

## 2019-08-03 DIAGNOSIS — E782 Mixed hyperlipidemia: Secondary | ICD-10-CM | POA: Diagnosis not present

## 2019-08-03 DIAGNOSIS — G8929 Other chronic pain: Secondary | ICD-10-CM

## 2019-08-03 DIAGNOSIS — Z23 Encounter for immunization: Secondary | ICD-10-CM | POA: Diagnosis not present

## 2019-08-03 DIAGNOSIS — J309 Allergic rhinitis, unspecified: Secondary | ICD-10-CM | POA: Diagnosis not present

## 2019-08-03 DIAGNOSIS — Z780 Asymptomatic menopausal state: Secondary | ICD-10-CM

## 2019-08-03 DIAGNOSIS — R7301 Impaired fasting glucose: Secondary | ICD-10-CM

## 2019-08-03 DIAGNOSIS — Z0000 Encounter for general adult medical examination without abnormal findings: Secondary | ICD-10-CM | POA: Diagnosis not present

## 2019-08-03 DIAGNOSIS — E669 Obesity, unspecified: Secondary | ICD-10-CM | POA: Diagnosis not present

## 2019-08-03 DIAGNOSIS — M5442 Lumbago with sciatica, left side: Secondary | ICD-10-CM

## 2019-08-03 DIAGNOSIS — Z6833 Body mass index (BMI) 33.0-33.9, adult: Secondary | ICD-10-CM | POA: Diagnosis not present

## 2019-08-03 DIAGNOSIS — M5441 Lumbago with sciatica, right side: Secondary | ICD-10-CM | POA: Diagnosis not present

## 2019-08-03 NOTE — Assessment & Plan Note (Signed)
Lost about 3 Lb since her last visit, 01/2019. We discussed benefits of wt loss as well as adverse effects of obesity. Consistency with healthy diet and physical activity recommended.

## 2019-08-03 NOTE — Progress Notes (Signed)
HPI:  Ms.Claude Kirsch is a 69 y.o. female, who is here today for 6 months follow up and AWV.  She was last seen on 02/02/19. No new problems since last visit.  She lives with her husband.. Independent ADL's and IADL's. No falls in the past year and denies depression symptoms.  Functional Status Survey: Is the patient deaf or have difficulty hearing?: Yes (Hx of right hearing loss.) Does the patient have difficulty seeing, even when wearing glasses/contacts?: No Does the patient have difficulty concentrating, remembering, or making decisions?: No Does the patient have difficulty walking or climbing stairs?: No Does the patient have difficulty dressing or bathing?: No Does the patient have difficulty doing errands alone such as visiting a doctor's office or shopping?: No  Fall Risk  08/03/2019 08/03/2019 02/03/2018 11/18/2015  Falls in the past year? 0 0 0 No  Number falls in past yr: 0 0 0 -  Injury with Fall? 0 0 0 -  Risk for fall due to : Orthopedic patient - Impaired balance/gait -  Follow up Education provided - Education provided -   Providers she sees regularly: Eye care provider: Dr Venetia Maxon.  Depression screen [facility] 2/9 08/03/2019  Decreased Interest 0  Down, Depressed, Hopeless 0  PHQ - 2 Score 0  Altered sleeping 1  Tired, decreased energy 0  Change in appetite 1  Feeling bad or failure about yourself  0  Trouble concentrating 0  Moving slowly or fidgety/restless 0  Suicidal thoughts 0  PHQ-9 Score 2  Difficult doing work/chores Not difficult at all    Mini-Cog - 08/03/19 1007    Normal clock drawing test? yes    How many words correct? 3           Hearing Screening   125Hz  250Hz  500Hz  1000Hz  2000Hz  3000Hz  4000Hz  6000Hz  8000Hz   Right ear:           Left ear:           Vision Screening Comments: Refused. Having glaucoma surgery.  CVD risks: HTN,obesity,IFG,and HLD.  HTN: She is on Losartan 50 mg daily,Metoprolol succinate 100 mg daily. Negative  for severe/frequent headache, chest pain, dyspnea, palpitation, focal weakness, or worsening edema. She is not checking BP regularly. She wonders if she needs to change antihypertensive med, she has been on same meds for years, no side effects.  Lab Results  Component Value Date   CREATININE 0.91 02/02/2019   BUN 15 02/02/2019   NA 144 02/02/2019   K 4.0 02/02/2019   CL 106 02/02/2019   CO2 28 02/02/2019  HypoK+, she is on KCL20 meq daily.  HLD: She is on Pravastatin 20 mg daily. Tolerating medication well.  Lab Results  Component Value Date   CHOL 185 08/04/2018   HDL 46.50 08/04/2018   LDLCALC 105 (H) 08/04/2018   LDLDIRECT 160.0 08/25/2015   TRIG 171.0 (H) 08/04/2018   CHOLHDL 4 08/04/2018   Prediabetes:Negative for polydipsia,polyuria, or polyphagia.  Lab Results  Component Value Date   HGBA1C 6.1 02/02/2019   OA/back pain: She is on Meloxicam 15 mg daily. Medication is helping with pain. Hips and knees.  Occasionally lower back radiates to LLE, burning like sensation. She has tried Cymbalta and Gabapentin,not well tolerated. Tramadol helped but her health insurance did not cover it.  Negative for saddle anesthesia or bladder/bowel dysfunction.  She is walking 3 times per week. Trying to follow a healthful diet. She has lost some wt since her last visit.  Allergic rhinitis: She is on Allegra, Astelin nasal spray and Flonase nasal spray. + Nasal congestion. Exacerbated by pollen.  Review of Systems  Constitutional: Negative for activity change, appetite change, fatigue and fever.  HENT: Negative for mouth sores, nosebleeds and sore throat.   Eyes: Negative for redness and visual disturbance.  Respiratory: Negative for cough and wheezing.   Gastrointestinal: Negative for abdominal pain, nausea and vomiting.       Negative for changes in bowel habits.  Genitourinary: Negative for decreased urine volume, dysuria and hematuria.  Musculoskeletal: Positive for  back pain. Negative for gait problem.  Skin: Negative for rash and wound.  Neurological: Negative for syncope, facial asymmetry and weakness.  Psychiatric/Behavioral: Negative for behavioral problems and confusion.  Rest of ROS, see pertinent positives sand negatives in HPI  Current Outpatient Medications on File Prior to Visit  Medication Sig Dispense Refill  . Ascorbic Acid (VITAMIN C) 100 MG tablet Take 100 mg by mouth daily.    Marland Kitchen aspirin 81 MG tablet Take 81 mg by mouth daily.    Marland Kitchen azelastine (ASTELIN) 0.1 % nasal spray Place 2 sprays into both nostrils 2 (two) times daily. Use in each nostril as directed 30 mL 3  . cholecalciferol (VITAMIN D) 1000 UNITS tablet Take 1,000 Units by mouth daily.    . fexofenadine (ALLEGRA ALLERGY) 180 MG tablet Take 1 tablet (180 mg total) by mouth daily. 90 tablet 3  . fluticasone (FLONASE) 50 MCG/ACT nasal spray Place 1 spray into both nostrils 2 (two) times daily. 16 g 3  . KLOR-CON M20 20 MEQ tablet TAKE 1 TABLET BY MOUTH TWICE A DAY 180 tablet 3  . losartan (COZAAR) 100 MG tablet TAKE 1 TABLET BY MOUTH EVERY DAY 90 tablet 2  . meloxicam (MOBIC) 15 MG tablet TAKE 1 TABLET BY MOUTH EVERY DAY 90 tablet 2  . metoprolol succinate (TOPROL-XL) 100 MG 24 hr tablet TAKE 1 TABLET EVERY DAY WITH A MEAL 90 tablet 2  . pravastatin (PRAVACHOL) 20 MG tablet Take 1 tablet (20 mg total) by mouth daily. 90 tablet 3  . VITAMIN E PO Take 1 tablet by mouth daily.    Marland Kitchen VYZULTA 0.024 % SOLN      No current facility-administered medications on file prior to visit.   Past Medical History:  Diagnosis Date  . High cholesterol   . Hypertension    Allergies  Allergen Reactions  . Penicillins Hives    Has patient had a PCN reaction causing immediate rash, facial/tongue/throat swelling, SOB or lightheadedness with hypotension: Yes Has patient had a PCN reaction causing severe rash involving mucus membranes or skin necrosis: Unknown Has patient had a PCN reaction that  required hospitalization: No Has patient had a PCN reaction occurring within the last 10 years: No If all of the above answers are "NO", then may proceed with Cephalosporin use.     Social History   Socioeconomic History  . Marital status: Married    Spouse name: Not on file  . Number of children: Not on file  . Years of education: Not on file  . Highest education level: Not on file  Occupational History  . Not on file  Tobacco Use  . Smoking status: Never Smoker  . Smokeless tobacco: Never Used  Substance and Sexual Activity  . Alcohol use: No  . Drug use: No  . Sexual activity: Never  Other Topics Concern  . Not on file  Social History Narrative  . Not on  file   Social Determinants of Health   Financial Resource Strain:   . Difficulty of Paying Living Expenses:   Food Insecurity:   . Worried About Charity fundraiser in the Last Year:   . Arboriculturist in the Last Year:   Transportation Needs:   . Film/video editor (Medical):   Marland Kitchen Lack of Transportation (Non-Medical):   Physical Activity:   . Days of Exercise per Week:   . Minutes of Exercise per Session:   Stress:   . Feeling of Stress :   Social Connections:   . Frequency of Communication with Friends and Family:   . Frequency of Social Gatherings with Friends and Family:   . Attends Religious Services:   . Active Member of Clubs or Organizations:   . Attends Archivist Meetings:   Marland Kitchen Marital Status:     Vitals:   08/03/19 0802  BP: 132/76  Pulse: 96  Resp: 16  Temp: (!) 97.2 F (36.2 C)  SpO2: 96%   Wt Readings from Last 3 Encounters:  08/03/19 197 lb 6 oz (89.5 kg)  02/02/19 200 lb 8 oz (90.9 kg)  08/04/18 199 lb (90.3 kg)   Body mass index is 33.88 kg/m.  Physical Exam  Nursing note and vitals reviewed. Constitutional: She is oriented to person, place, and time. She appears well-developed. No distress.  HENT:  Head: Normocephalic and atraumatic.  Mouth/Throat: Mucous  membranes are moist.  Eyes: Pupils are equal, round, and reactive to light. Conjunctivae are normal.  Cardiovascular: Normal rate and regular rhythm.  No murmur heard. Pulses:      Dorsalis pedis pulses are 2+ on the right side and 2+ on the left side.  Respiratory: Effort normal and breath sounds normal. No respiratory distress.  GI: Soft. Normal appearance. She exhibits no mass. There is no hepatomegaly. There is no abdominal tenderness.  Lymphadenopathy:    She has no cervical adenopathy.  Neurological: She is alert and oriented to person, place, and time. No cranial nerve deficit.  Stable gait with no assistance.  Skin: Skin is warm. No rash noted. No erythema.  Psychiatric: Mood normal.  Well groomed, good eye contact.   ASSESSMENT AND PLAN:  Ms. Daryan Cagley was seen today for AWV and 6 months follow-up.  Orders Placed This Encounter  Procedures  . DG Bone Density  . Pneumococcal polysaccharide vaccine 23-valent greater than or equal to 2yo subcutaneous/IM  . Hemoglobin A1c  . Lipid panel  . Basic metabolic panel   Medicare annual wellness visit, subsequent We discussed the importance of staying active, physically and mentally, as well as the benefits of a healthy/balance diet. Low impact exercise that involve stretching and strengthing are ideal. Vaccines updated. We discussed preventive screening for the next 5-10 years, summery of recommendations given in AVS.  Reporting colonoscopy as current. Mammogram 03/12/19. DEXA with next mammogram. Fall prevention.  Advance directives and end of life discussed, she has some non written  arrangements. Advance directives package given.   Essential hypertension, benign BP adequately controlled. Continue current medications. Low salt diet. Eye exam is current.  Allergic rhinitis Saline nasal irrigations as needed. Continue Flonase nasal spray daily as needed and OTC antihistaminic.  Chronic bilateral low back pain with  bilateral sciatica Stable. Continue Mobic 15 mg daily as needed. We are following renal function closely. She understands possible side effects of chronic NSAID's use.  Class 1 obesity with body mass index (BMI) of 33.0 to  33.9 in adult Lost about 3 Lb since her last visit, 01/2019. We discussed benefits of wt loss as well as adverse effects of obesity. Consistency with healthy diet and physical activity recommended.   Hyperlipemia, mixed Fasting lab appt will be arranged and further recommendations will be given accordingly. Continue Pravastatin.  IFG (impaired fasting glucose) Healthy life style for primary prevention. Further recommendations according to HgA1C.   Need for pneumococcal vaccination - Pneumococcal polysaccharide vaccine 23-valent greater than or equal to 2yo subcutaneous/IM  Asymptomatic postmenopausal estrogen deficiency - DG Bone Density; Future  Return in about 6 months (around 02/02/2020) for Labs next week (fasting)..   Shallon Yaklin G. Martinique, MD  [facility]. [place] office.   A few things to remember from today's visit:   Essential hypertension, benign  Allergic rhinitis, unspecified seasonality, unspecified trigger  Hyperlipemia, mixed  Chronic bilateral low back pain with bilateral sciatica  IFG (impaired fasting glucose)  Medicare annual wellness visit, subsequent  If you need refills please call your pharmacy. Do not use My Chart to request refills or for acute issues that need immediate attention.    Please be sure medication list is accurate. If a new problem present, please set up appointment sooner than planned today.  Ms. Janes , Thank you for taking time to come for your Medicare Wellness Visit. I appreciate your ongoing commitment to your health goals. Please review the following plan we discussed and let me know if I can assist you in the future.   These are the goals we discussed: Goals    . Weight (lb) < 180 lb  (81.6 kg)     Look for foods with "whole" wheat; bran; oatmeal etc  Shot at the farmer's markets in season for fresher choices  Watch for "hydrogenated" on the label of oils which are trans-fats.  Watch for "high fructose corn syrup" in snacks, yogurt or ketchup   Portion control is essential to a health weight! Sit down; take a break and enjoy your meal; take smaller bites; put the fork down between bites;  It takes 20 minutes to get full; so check in with your fullness cues and stop eating when you start to fill full. Continue walking 3 or more times per week.             This is a list of the screening recommended for you and due dates:  Health Maintenance  Topic Date Due  . COVID-19 Vaccine (1) Never done  . Tetanus Vaccine  Never done  . Colon Cancer Screening  Never done  . Pneumonia vaccines (2 of 2 - PPSV23) 02/04/2019  . DEXA scan (bone density measurement)  02/02/2020*  . Mammogram  03/11/2021  . Flu Shot  Discontinued  .  Hepatitis C: One time screening is recommended by Center for Disease Control  (CDC) for  adults born from [birth year] through 1963.   Discontinued  *Topic was postponed. The date shown is not the original due date.    A few tips:  -As we age balance is not as good as it was, so there is a higher risks for falls. Please remove small rugs and furniture that is "in your way" and could increase the risk of falls. Stretching exercises may help with fall prevention: Yoga and Tai Chi are some examples. Low impact exercise is better, so you are not very achy the next day.  -Sun screen and avoidance of direct sun light recommended. Caution with dehydration, if working outdoors  be sure to drink enough fluids.  - Some medications are not safe as we age, increases the risk of side effects and can potentially interact with other medication you are also taken;  including some of over the counter medications. Be sure to let me know when you start a new medication even  if it is a dietary/vitamin supplement.   -Healthy diet low in red meet/animal fat and sugar + regular physical activity is recommended.    Screening schedule for the next 5-10 years:  Colonoscopy: Up to date, every 7 years.  Mammogram for breast cancer screening annually. 09/19/2019. DEXA with mammogram.  Flu vaccine annually.  Diabetes screening   Fall prevention   Advance directives: Be sure information is up to date. Please see a lawyer and/or go to this website to help you with advanced directives and designating a health care power of attorney so that your wishes will be followed should you become too ill to make your own medical decisions.  [email or web address]

## 2019-08-03 NOTE — Assessment & Plan Note (Signed)
Stable. Continue Mobic 15 mg daily as needed. We are following renal function closely. She understands possible side effects of chronic NSAID's use.

## 2019-08-03 NOTE — Assessment & Plan Note (Signed)
BP adequately controlled. Continue current medications. Low salt diet. Eye exam is current.

## 2019-08-03 NOTE — Assessment & Plan Note (Signed)
Healthy life style for primary prevention. °Further recommendations according to HgA1C. °

## 2019-08-03 NOTE — Assessment & Plan Note (Signed)
Saline nasal irrigations as needed. Continue Flonase nasal spray daily as needed and OTC antihistaminic.

## 2019-08-03 NOTE — Assessment & Plan Note (Signed)
Fasting lab appt will be arranged and further recommendations will be given accordingly. Continue Pravastatin.

## 2019-08-03 NOTE — Patient Instructions (Addendum)
A few things to remember from today's visit:   Essential hypertension, benign  Allergic rhinitis, unspecified seasonality, unspecified trigger  Hyperlipemia, mixed  Chronic bilateral low back pain with bilateral sciatica  IFG (impaired fasting glucose)  Medicare annual wellness visit, subsequent  If you need refills please call your pharmacy. Do not use My Chart to request refills or for acute issues that need immediate attention.    Please be sure medication list is accurate. If a new problem present, please set up appointment sooner than planned today.  Meghan Nichols , Thank you for taking time to come for your Medicare Wellness Visit. I appreciate your ongoing commitment to your health goals. Please review the following plan we discussed and let me know if I can assist you in the future.   These are the goals we discussed: Goals    . Weight (lb) < 180 lb (81.6 kg)     Look for foods with "whole" wheat; bran; oatmeal etc  Shot at the farmer's markets in season for fresher choices  Watch for "hydrogenated" on the label of oils which are trans-fats.  Watch for "high fructose corn syrup" in snacks, yogurt or ketchup   Portion control is essential to a health weight! Sit down; take a break and enjoy your meal; take smaller bites; put the fork down between bites;  It takes 20 minutes to get full; so check in with your fullness cues and stop eating when you start to fill full. Continue walking 3 or more times per week.             This is a list of the screening recommended for you and due dates:  Health Maintenance  Topic Date Due  . COVID-19 Vaccine (1) Never done  . Tetanus Vaccine  Never done  . Colon Cancer Screening  Never done  . Pneumonia vaccines (2 of 2 - PPSV23) 02/04/2019  . DEXA scan (bone density measurement)  02/02/2020*  . Mammogram  03/11/2021  . Flu Shot  Discontinued  .  Hepatitis C: One time screening is recommended by Center for Disease Control   (CDC) for  adults born from [birth year] through 1963.   Discontinued  *Topic was postponed. The date shown is not the original due date.    A few tips:  -As we age balance is not as good as it was, so there is a higher risks for falls. Please remove small rugs and furniture that is "in your way" and could increase the risk of falls. Stretching exercises may help with fall prevention: Yoga and Tai Chi are some examples. Low impact exercise is better, so you are not very achy the next day.  -Sun screen and avoidance of direct sun light recommended. Caution with dehydration, if working outdoors be sure to drink enough fluids.  - Some medications are not safe as we age, increases the risk of side effects and can potentially interact with other medication you are also taken;  including some of over the counter medications. Be sure to let me know when you start a new medication even if it is a dietary/vitamin supplement.   -Healthy diet low in red meet/animal fat and sugar + regular physical activity is recommended.     Screening schedule for the next 5-10 years:  Colonoscopy: Up to date, every 7 years.  Mammogram for breast cancer screening annually. 09/19/2019. DEXA with mammogram.  Flu vaccine annually.  Diabetes screening   Fall prevention   Advance directives: Be  sure information is up to date. Please see a lawyer and/or go to this website to help you with advanced directives and designating a health care power of attorney so that your wishes will be followed should you become too ill to make your own medical decisions.  [email or web address]

## 2019-08-05 ENCOUNTER — Other Ambulatory Visit: Payer: Self-pay | Admitting: Family Medicine

## 2019-08-05 DIAGNOSIS — I10 Essential (primary) hypertension: Secondary | ICD-10-CM

## 2019-08-07 ENCOUNTER — Other Ambulatory Visit (INDEPENDENT_AMBULATORY_CARE_PROVIDER_SITE_OTHER): Payer: Medicare Other

## 2019-08-07 ENCOUNTER — Other Ambulatory Visit: Payer: Self-pay

## 2019-08-07 DIAGNOSIS — R7301 Impaired fasting glucose: Secondary | ICD-10-CM | POA: Diagnosis not present

## 2019-08-07 DIAGNOSIS — E782 Mixed hyperlipidemia: Secondary | ICD-10-CM

## 2019-08-07 DIAGNOSIS — I10 Essential (primary) hypertension: Secondary | ICD-10-CM

## 2019-08-07 LAB — BASIC METABOLIC PANEL
BUN: 16 mg/dL (ref 6–23)
CO2: 29 mEq/L (ref 19–32)
Calcium: 9.8 mg/dL (ref 8.4–10.5)
Chloride: 105 mEq/L (ref 96–112)
Creatinine, Ser: 0.96 mg/dL (ref 0.40–1.20)
GFR: 70.12 mL/min (ref >60.00)
Glucose, Bld: 87 mg/dL (ref 70–99)
Potassium: 3.4 mEq/L — ABNORMAL LOW (ref 3.5–5.1)
Sodium: 142 mEq/L (ref 135–145)

## 2019-08-07 LAB — LIPID PANEL
Cholesterol: 265 mg/dL — ABNORMAL HIGH (ref 0–200)
HDL: 59.9 mg/dL (ref >39.00)
LDL Cholesterol: 175 mg/dL — ABNORMAL HIGH (ref 0–99)
NonHDL: 205.19
Total CHOL/HDL Ratio: 4
Triglycerides: 150 mg/dL — ABNORMAL HIGH (ref 0.0–149.0)
VLDL: 30 mg/dL (ref 0.0–40.0)

## 2019-08-07 LAB — HEMOGLOBIN A1C: Hgb A1c MFr Bld: 6 % (ref 4.6–6.5)

## 2019-08-10 ENCOUNTER — Other Ambulatory Visit: Payer: Self-pay | Admitting: *Deleted

## 2019-08-10 ENCOUNTER — Telehealth: Payer: Self-pay | Admitting: Family Medicine

## 2019-08-10 MED ORDER — PRAVASTATIN SODIUM 20 MG PO TABS: 20.0000 mg | ORAL_TABLET | Freq: Every day | ORAL | 3 refills | Status: DC

## 2019-08-10 NOTE — Telephone Encounter (Signed)
Pt was seen on 08/03/19 and told Dr. [place], she does not need a refill on her medications. Per pt, she does need a refill on Pravastatin 20 mg because the ones she has at home have expired. Pt uses [facility] [place] Humphrey

## 2019-08-10 NOTE — Telephone Encounter (Signed)
Rx sent to pharmacy as requested.

## 2019-08-13 ENCOUNTER — Other Ambulatory Visit: Payer: Self-pay | Admitting: Family Medicine

## 2019-08-13 DIAGNOSIS — I10 Essential (primary) hypertension: Secondary | ICD-10-CM

## 2019-08-16 ENCOUNTER — Other Ambulatory Visit: Payer: Self-pay | Admitting: Family Medicine

## 2019-08-16 DIAGNOSIS — I10 Essential (primary) hypertension: Secondary | ICD-10-CM

## 2019-08-30 ENCOUNTER — Other Ambulatory Visit: Payer: Self-pay | Admitting: Family Medicine

## 2019-09-17 DIAGNOSIS — Z780 Asymptomatic menopausal state: Secondary | ICD-10-CM | POA: Diagnosis not present

## 2019-09-17 DIAGNOSIS — R928 Other abnormal and inconclusive findings on diagnostic imaging of breast: Secondary | ICD-10-CM | POA: Diagnosis not present

## 2019-09-17 LAB — HM MAMMOGRAPHY

## 2019-09-17 LAB — HM DEXA SCAN

## 2019-09-18 ENCOUNTER — Encounter: Payer: Self-pay | Admitting: Family Medicine

## 2019-09-22 ENCOUNTER — Encounter: Payer: Self-pay | Admitting: Family Medicine

## 2019-10-28 ENCOUNTER — Other Ambulatory Visit: Payer: Self-pay | Admitting: Family Medicine

## 2019-10-28 DIAGNOSIS — I10 Essential (primary) hypertension: Secondary | ICD-10-CM

## 2019-10-28 MED ORDER — LOSARTAN POTASSIUM 100 MG PO TABS: 100.0000 mg | ORAL_TABLET | Freq: Every day | ORAL | 2 refills | Status: DC

## 2019-11-05 DIAGNOSIS — H43813 Vitreous degeneration, bilateral: Secondary | ICD-10-CM | POA: Diagnosis not present

## 2019-11-05 DIAGNOSIS — H2513 Age-related nuclear cataract, bilateral: Secondary | ICD-10-CM | POA: Diagnosis not present

## 2019-11-05 DIAGNOSIS — H43393 Other vitreous opacities, bilateral: Secondary | ICD-10-CM | POA: Diagnosis not present

## 2019-11-05 DIAGNOSIS — H3562 Retinal hemorrhage, left eye: Secondary | ICD-10-CM | POA: Diagnosis not present

## 2019-11-05 DIAGNOSIS — H35363 Drusen (degenerative) of macula, bilateral: Secondary | ICD-10-CM | POA: Diagnosis not present

## 2019-11-05 DIAGNOSIS — H348322 Tributary (branch) retinal vein occlusion, left eye, stable: Secondary | ICD-10-CM | POA: Diagnosis not present

## 2019-11-05 DIAGNOSIS — H401131 Primary open-angle glaucoma, bilateral, mild stage: Secondary | ICD-10-CM | POA: Diagnosis not present

## 2019-11-05 DIAGNOSIS — H35033 Hypertensive retinopathy, bilateral: Secondary | ICD-10-CM | POA: Diagnosis not present

## 2019-11-19 ENCOUNTER — Other Ambulatory Visit: Payer: Self-pay | Admitting: Family Medicine

## 2019-12-02 ENCOUNTER — Other Ambulatory Visit: Payer: Self-pay | Admitting: Family Medicine

## 2019-12-02 DIAGNOSIS — J309 Allergic rhinitis, unspecified: Secondary | ICD-10-CM

## 2019-12-11 ENCOUNTER — Encounter (INDEPENDENT_AMBULATORY_CARE_PROVIDER_SITE_OTHER): Payer: Medicare Other | Admitting: Ophthalmology

## 2019-12-21 LAB — HM MAMMOGRAPHY

## 2019-12-25 ENCOUNTER — Encounter (INDEPENDENT_AMBULATORY_CARE_PROVIDER_SITE_OTHER): Payer: Medicare Other | Admitting: Ophthalmology

## 2020-02-02 ENCOUNTER — Ambulatory Visit (INDEPENDENT_AMBULATORY_CARE_PROVIDER_SITE_OTHER): Payer: Medicare Other | Admitting: Family Medicine

## 2020-02-02 ENCOUNTER — Other Ambulatory Visit: Payer: Self-pay

## 2020-02-02 ENCOUNTER — Encounter: Payer: Self-pay | Admitting: Family Medicine

## 2020-02-02 VITALS — BP 170/120 | HR 103 | Temp 98.3°F | Resp 16 | Ht 64.0 in | Wt 198.2 lb

## 2020-02-02 DIAGNOSIS — I10 Essential (primary) hypertension: Secondary | ICD-10-CM | POA: Diagnosis not present

## 2020-02-02 DIAGNOSIS — M5441 Lumbago with sciatica, right side: Secondary | ICD-10-CM | POA: Diagnosis not present

## 2020-02-02 DIAGNOSIS — J309 Allergic rhinitis, unspecified: Secondary | ICD-10-CM | POA: Diagnosis not present

## 2020-02-02 DIAGNOSIS — E876 Hypokalemia: Secondary | ICD-10-CM | POA: Diagnosis not present

## 2020-02-02 DIAGNOSIS — R7301 Impaired fasting glucose: Secondary | ICD-10-CM

## 2020-02-02 DIAGNOSIS — E782 Mixed hyperlipidemia: Secondary | ICD-10-CM | POA: Diagnosis not present

## 2020-02-02 DIAGNOSIS — G8929 Other chronic pain: Secondary | ICD-10-CM

## 2020-02-02 DIAGNOSIS — H35032 Hypertensive retinopathy, left eye: Secondary | ICD-10-CM

## 2020-02-02 DIAGNOSIS — M5442 Lumbago with sciatica, left side: Secondary | ICD-10-CM | POA: Diagnosis not present

## 2020-02-02 MED ORDER — TRAMADOL HCL 50 MG PO TABS: 50.0000 mg | ORAL_TABLET | Freq: Every day | ORAL | 0 refills | Status: DC | PRN

## 2020-02-02 MED ORDER — LOSARTAN POTASSIUM-HCTZ 100-25 MG PO TABS: 1.0000 | ORAL_TABLET | Freq: Every day | ORAL | 1 refills | Status: DC

## 2020-02-02 NOTE — Assessment & Plan Note (Addendum)
Healthy life style for primary prevention. Wt loss will help, eat has been stable.

## 2020-02-02 NOTE — Assessment & Plan Note (Signed)
Continue Pravastatin 20 mg daily and low fat diet. 

## 2020-02-02 NOTE — Assessment & Plan Note (Addendum)
Continue K-Lor 20 mEq daily.  HCTZ may aggravate problem. Further recommendations according to K+ result.

## 2020-02-02 NOTE — Progress Notes (Signed)
HPI: [name] is a 69 y.o. female, who is here today for 5 months follow up.   She was last seen on 08/03/19.  HTN: Diagnosed in 1999. Currently she is on losartan 50 mg daily. Home BP 130/s/80's. Today BP is elevated, attributed to stress while driving and "sinuses." History of allergy rhinitis, having frontal pressure headache, nasal congestion, and rhinorrhea. Negative for fever, chills, sore throat, or unusual fatigue. She takes Allegra 180 mg daily, Flonase nasal spray, and Astelin nasal spray.  Negative for visual changes, chest pain, dyspnea, palpitation, focal weakness, unusual edema.  Lab Results  Component Value Date   CREATININE 0.96 08/07/2019   BUN 16 08/07/2019   NA 142 08/07/2019   K 3.4 (L) 08/07/2019   CL 105 08/07/2019   CO2 29 08/07/2019   Hypokalemia: She is on K-Lor 20 mg daily.  Prediabetes: Negative for polydipsia,polyuria, and polyphagia.  Lab Results  Component Value Date   HGBA1C 6.0 08/07/2019   Hyperlipidemia: In 07/2019 she started pravastatin 20 mg daily.  Lab Results  Component Value Date   CHOL 265 (H) 08/07/2019   HDL 59.90 08/07/2019   LDLCALC 175 (H) 08/07/2019   LDLDIRECT 160.0 08/25/2015   TRIG 150.0 (H) 08/07/2019   CHOLHDL 4 08/07/2019   She is on Meloxicam 15 mg, which she does not take daily but when she does she takes 2 at the time. In the past she was on Tramadol. Bilateral lower back pain radiated to both extremities, intermittent episodes of numbness. Negative for saddle anesthesia or bowel/bladder dysfunction. Pain is constant with.  So exacerbation. Max 8-9/10. She tried PT in the past, did not feel like he was helping.  She was on Cymbalta in the past but discontinued because did not notice significant improvement. Lumbar CT in 02/2010:1.  No appreciable change since last December.  2. Minor disc bulge at L2-3 without herniation or stenosis.   3. L3-4: Disc bulge with prominence in the left  foraminal to extraforaminal region. The L-3 nerve root appears to exit freely but could conceivably be irritated in the extraforaminal region.  4. Minor left foraminal to extraforaminal disc bulge at L4-5 without apparent neural compression.  5. Shallow disc protrusion at L5-S1 eccentrically more prominent towards the left but without apparent neural compression.    Review of Systems  Constitutional: Negative for activity change, appetite change and fatigue.  HENT: Positive for postnasal drip. Negative for mouth sores, nosebleeds and sinus pain.   Respiratory: Negative for cough and wheezing.   Gastrointestinal: Negative for abdominal pain, nausea and vomiting.       Negative for changes in bowel habits.  Genitourinary: Negative for decreased urine volume and hematuria.  Allergic/Immunologic: Positive for environmental allergies.  Neurological: Negative for syncope, facial asymmetry and weakness.  Psychiatric/Behavioral: Negative for confusion.  Rest of ROS, see pertinent positives sand negatives in HPI  Current Outpatient Medications on File Prior to Visit  Medication Sig Dispense Refill  . Ascorbic Acid (VITAMIN C) 100 MG tablet Take 100 mg by mouth daily.    Marland Kitchen aspirin 81 MG tablet Take 81 mg by mouth daily.    Marland Kitchen azelastine (ASTELIN) 0.1 % nasal spray Place 2 sprays into both nostrils 2 (two) times daily. Use in each nostril as directed 30 mL 3  . cholecalciferol (VITAMIN D) 1000 UNITS tablet Take 1,000 Units by mouth daily.    . fexofenadine (ALLEGRA) 180 MG tablet TAKE 1 TABLET BY MOUTH EVERY DAY 90 tablet 3  .  fluticasone (FLONASE) 50 MCG/ACT nasal spray Place 1 spray into both nostrils 2 (two) times daily. 16 g 3  . KLOR-CON M20 20 MEQ tablet TAKE 1 TABLET BY MOUTH TWICE A DAY 180 tablet 3  . meloxicam (MOBIC) 15 MG tablet TAKE 1 TABLET BY MOUTH EVERY DAY 90 tablet 2  . metoprolol succinate (TOPROL-XL) 100 MG 24 hr tablet TAKE 1 TABLET EVERY DAY WITH A MEAL 90 tablet 2  .  pravastatin (PRAVACHOL) 20 MG tablet Take 1 tablet (20 mg total) by mouth daily. 90 tablet 3  . VITAMIN E PO Take 1 tablet by mouth daily.    Marland Kitchen. VYZULTA 0.024 % SOLN      No current facility-administered medications on file prior to visit.   Past Medical History:  Diagnosis Date  . High cholesterol   . Hypertension    Allergies  Allergen Reactions  . Penicillins Hives    Has patient had a PCN reaction causing immediate rash, facial/tongue/throat swelling, SOB or lightheadedness with hypotension: Yes Has patient had a PCN reaction causing severe rash involving mucus membranes or skin necrosis: Unknown Has patient had a PCN reaction that required hospitalization: No Has patient had a PCN reaction occurring within the last 10 years: No If all of the above answers are "NO", then may proceed with Cephalosporin use.     Social History   Socioeconomic History  . Marital status: Married    Spouse name: Not on file  . Number of children: Not on file  . Years of education: Not on file  . Highest education level: Not on file  Occupational History  . Not on file  Tobacco Use  . Smoking status: Never Smoker  . Smokeless tobacco: Never Used  Substance and Sexual Activity  . Alcohol use: No  . Drug use: No  . Sexual activity: Never  Other Topics Concern  . Not on file  Social History Narrative  . Not on file   Social Determinants of Health   Financial Resource Strain: Not on file  Food Insecurity: Not on file  Transportation Needs: Not on file  Physical Activity: Not on file  Stress: Not on file  Social Connections: Not on file   Vitals:   02/02/20 0811  BP: (!) 170/120  Pulse: (!) 103  Resp: 16  Temp: 98.3 F (36.8 C)  SpO2: 94%   Body mass index is 34.02 kg/m.  Physical Exam Vitals and nursing note reviewed.  Constitutional:      General: She is not in acute distress.    Appearance: She is well-developed.  HENT:     Head: Normocephalic and atraumatic.      Nose: Rhinorrhea present.     Mouth/Throat:     Mouth: Oropharynx is clear and moist and mucous membranes are normal.  Eyes:     Conjunctiva/sclera: Conjunctivae normal.     Pupils: Pupils are equal, round, and reactive to light.  Cardiovascular:     Rate and Rhythm: Normal rate and regular rhythm.     Pulses:          Dorsalis pedis pulses are 2+ on the right side and 2+ on the left side.     Heart sounds: No murmur heard.     Comments: HR: 96/min Pulmonary:     Effort: Pulmonary effort is normal. No respiratory distress.     Breath sounds: Normal breath sounds.  Abdominal:     Palpations: Abdomen is soft. There is no hepatomegaly or mass.  Tenderness: There is no abdominal tenderness.  Musculoskeletal:        General: No edema.     Lumbar back: Tenderness present. No bony tenderness.       Back:  Lymphadenopathy:     Cervical: No cervical adenopathy.  Skin:    General: Skin is warm.     Findings: No erythema or rash.  Neurological:     Mental Status: She is alert and oriented to person, place, and time.     Cranial Nerves: No cranial nerve deficit.     Deep Tendon Reflexes: Strength normal.     Comments: Antalgic gait assisted with a cane.  Psychiatric:        Mood and Affect: Mood and affect normal.     Comments: Well groomed, good eye contact.   ASSESSMENT AND PLAN:  [name] was seen today for 5 months follow-up.  Orders Placed This Encounter  Procedures  . COMPLETE METABOLIC PANEL WITH GFR  . Hemoglobin A1c  . Lipid panel   Lab Results  Component Value Date   CHOL 214 (H) 02/02/2020   HDL 62 02/02/2020   LDLCALC 126 (H) 02/02/2020   LDLDIRECT 160.0 08/25/2015   TRIG 144 02/02/2020   CHOLHDL 3.5 02/02/2020   Lab Results  Component Value Date   ALT 23 02/02/2020   AST 22 02/02/2020   ALKPHOS 95 02/02/2019   BILITOT 0.3 02/02/2020   Lab Results  Component Value Date   CREATININE 0.98 02/02/2020   BUN 11 02/02/2020   NA 144 02/02/2020    K 3.4 (L) 02/02/2020   CL 107 02/02/2020   CO2 27 02/02/2020   Lab Results  Component Value Date   HGBA1C 5.9 (H) 02/02/2020    Essential hypertension, benign Problem is not well controlled.  Recheck x2: 170/100.  Possible complications of elevated BP discussed. Changes today:HCTZ 25 mg added. Continue metoprolol succinate 100 mg daily and losartan 100 mg daily. Monitor BP at home twice daily. Instructed about warning signs. Follow-up in in 2 months.   IFG (impaired fasting glucose) Healthy life style for primary prevention. Wt loss will help, eat has been stable.   Hypokalemia Continue K-Lor 20 mEq daily.  HCTZ may aggravate problem. Further recommendations according to K+ result.  Hyperlipemia, mixed Continue Pravastatin 20 mg daily and low fat diet.  Chronic bilateral low back pain with bilateral sciatica Tramadol was well tolerated in the past, she would like to resume it. Tramadol 50 mg daily as needed. Continue meloxicam up to 15 mg daily. Tylenol 500 mg 3-4 times per day as needed. Side effects of meds reviewed.  Allergic rhinitis Continue Allegra 100 mg daily, Astelin nasal spray twice daily and Flonase nasal spray daily as needed Nasal saline irrigations will also help.   Spent 45 minutes.  During this time history was obtained, examination was performed, prior labs/imaging reviewed,  assessment/plan discussed, and documentation completed.  Return in about 2 months (around 04/04/2020).   Raynold Blankenbaker G. [place], MD  [facility]. [place] office.    A few things to remember from today's visit:   Essential hypertension, benign  IFG (impaired fasting glucose)  Hyperlipemia, mixed  Hypokalemia  If you need refills please call your pharmacy. Do not use My Chart to request refills or for acute issues that need immediate attention.   For better pain control we can try Tramadol again. Meloxicam max dose if 15 mg, you can add Tylenol  [phone] mg. Today HCTZ added. Monitor  blood pressure Am and Pm and let me know about readings in 2-3 weeks.  Blood Pressure Record Sheet To take your blood pressure, you will need a blood pressure machine. You can buy a blood pressure machine (blood pressure monitor) at your clinic, drug store, or online. When choosing one, consider:  An automatic monitor that has an arm cuff.  A cuff that wraps snugly around your upper arm. You should be able to fit only one finger between your arm and the cuff.  A device that stores blood pressure reading results.  Do not choose a monitor that measures your blood pressure from your wrist or finger. Follow your health care provider's instructions for how to take your blood pressure. To use this form:  Get one reading in the morning (a.m.) before you take any medicines.  Get one reading in the evening (p.m.) before supper.  Take at least 2 readings with each blood pressure check. This makes sure the results are correct. Wait 1-2 minutes between measurements.  Write down the results in the spaces on this form.  Repeat this once a week, or as told by your health care provider.  Make a follow-up appointment with your health care provider to discuss the results.   Blood pressure log Date: _______________________  a.m. _____________________(1st reading) _____________________(2nd reading)  p.m. _____________________(1st reading) _____________________(2nd reading) Date: _______________________  a.m. _____________________(1st reading) _____________________(2nd reading)  p.m. _____________________(1st reading) _____________________(2nd reading) Date: _______________________  a.m. _____________________(1st reading) _____________________(2nd reading)  p.m. _____________________(1st reading) _____________________(2nd reading) Date: _______________________  a.m. _____________________(1st reading) _____________________(2nd reading)  p.m.  _____________________(1st reading) _____________________(2nd reading) Date: _______________________  a.m. _____________________(1st reading) _____________________(2nd reading)  p.m. _____________________(1st reading) _____________________(2nd reading)    This information is not intended to replace advice given to you by your health care provider. Make sure you discuss any questions you have with your health care provider. Document Revised: 04/05/2017 Document Reviewed: 02/05/2017 Elsevier Patient Education  2020 [employer].   Please be sure medication list is accurate. If a new problem present, please set up appointment sooner than planned today.

## 2020-02-02 NOTE — Assessment & Plan Note (Addendum)
Problem is not well controlled.  Recheck x2: 170/100.  Possible complications of elevated BP discussed. Changes today:HCTZ 25 mg added. Continue metoprolol succinate 100 mg daily and losartan 100 mg daily. Monitor BP at home twice daily. Instructed about warning signs. Follow-up in in 2 months.

## 2020-02-02 NOTE — Assessment & Plan Note (Signed)
Continue Allegra 100 mg daily, Astelin nasal spray twice daily and Flonase nasal spray daily as needed Nasal saline irrigations will also help.

## 2020-02-02 NOTE — Assessment & Plan Note (Addendum)
Tramadol was well tolerated in the past, she would like to resume it. Tramadol 50 mg daily as needed. Continue meloxicam up to 15 mg daily. Tylenol 500 mg 3-4 times per day as needed. Side effects of meds reviewed.

## 2020-02-02 NOTE — Patient Instructions (Addendum)
A few things to remember from today's visit:   Essential hypertension, benign  IFG (impaired fasting glucose)  Hyperlipemia, mixed  Hypokalemia  If you need refills please call your pharmacy. Do not use My Chart to request refills or for acute issues that need immediate attention.   For better pain control we can try Tramadol again. Meloxicam max dose if 15 mg, you can add Tylenol [phone] mg. Today HCTZ added. Monitor blood pressure Am and Pm and let me know about readings in 2-3 weeks.  Blood Pressure Record Sheet To take your blood pressure, you will need a blood pressure machine. You can buy a blood pressure machine (blood pressure monitor) at your clinic, drug store, or online. When choosing one, consider:  An automatic monitor that has an arm cuff.  A cuff that wraps snugly around your upper arm. You should be able to fit only one finger between your arm and the cuff.  A device that stores blood pressure reading results.  Do not choose a monitor that measures your blood pressure from your wrist or finger. Follow your health care provider's instructions for how to take your blood pressure. To use this form:  Get one reading in the morning (a.m.) before you take any medicines.  Get one reading in the evening (p.m.) before supper.  Take at least 2 readings with each blood pressure check. This makes sure the results are correct. Wait 1-2 minutes between measurements.  Write down the results in the spaces on this form.  Repeat this once a week, or as told by your health care provider.  Make a follow-up appointment with your health care provider to discuss the results.   Blood pressure log Date: _______________________  a.m. _____________________(1st reading) _____________________(2nd reading)  p.m. _____________________(1st reading) _____________________(2nd reading) Date: _______________________  a.m. _____________________(1st reading) _____________________(2nd  reading)  p.m. _____________________(1st reading) _____________________(2nd reading) Date: _______________________  a.m. _____________________(1st reading) _____________________(2nd reading)  p.m. _____________________(1st reading) _____________________(2nd reading) Date: _______________________  a.m. _____________________(1st reading) _____________________(2nd reading)  p.m. _____________________(1st reading) _____________________(2nd reading) Date: _______________________  a.m. _____________________(1st reading) _____________________(2nd reading)  p.m. _____________________(1st reading) _____________________(2nd reading)    This information is not intended to replace advice given to you by your health care provider. Make sure you discuss any questions you have with your health care provider. Document Revised: 04/05/2017 Document Reviewed: 02/05/2017 Elsevier Patient Education  2020 [employer].   Please be sure medication list is accurate. If a new problem present, please set up appointment sooner than planned today.

## 2020-02-03 LAB — COMPLETE METABOLIC PANEL WITH GFR
AG Ratio: 1.1 (calc) (ref 1.0–2.5)
ALT: 23 U/L (ref 6–29)
AST: 22 U/L (ref 10–35)
Albumin: 4.2 g/dL (ref 3.6–5.1)
Alkaline phosphatase (APISO): 82 U/L (ref 37–153)
BUN: 11 mg/dL (ref 7–25)
CO2: 27 mmol/L (ref 20–32)
Calcium: 9.4 mg/dL (ref 8.6–10.4)
Chloride: 107 mmol/L (ref 98–110)
Creat: 0.98 mg/dL (ref 0.50–0.99)
GFR, Est African American: 69 mL/min/{1.73_m2} (ref >=60)
GFR, Est Non African American: 60 mL/min/{1.73_m2} (ref >=60)
Globulin: 3.9 g/dL (calc) — ABNORMAL HIGH (ref 1.9–3.7)
Glucose, Bld: 95 mg/dL (ref 65–99)
Potassium: 3.4 mmol/L — ABNORMAL LOW (ref 3.5–5.3)
Sodium: 144 mmol/L (ref 135–146)
Total Bilirubin: 0.3 mg/dL (ref 0.2–1.2)
Total Protein: 8.1 g/dL (ref 6.1–8.1)

## 2020-02-03 LAB — LIPID PANEL
Cholesterol: 214 mg/dL — ABNORMAL HIGH (ref <200)
HDL: 62 mg/dL (ref >=50)
LDL Cholesterol (Calc): 126 mg/dL (calc) — ABNORMAL HIGH
Non-HDL Cholesterol (Calc): 152 mg/dL (calc) — ABNORMAL HIGH (ref <130)
Total CHOL/HDL Ratio: 3.5 (calc) (ref <5.0)
Triglycerides: 144 mg/dL (ref <150)

## 2020-02-03 LAB — HEMOGLOBIN A1C
Hgb A1c MFr Bld: 5.9 % of total Hgb — ABNORMAL HIGH (ref <5.7)
Mean Plasma Glucose: 123 mg/dL
eAG (mmol/L): 6.8 mmol/L

## 2020-02-07 MED ORDER — POTASSIUM CHLORIDE CRYS ER 20 MEQ PO TBCR: 20.0000 meq | EXTENDED_RELEASE_TABLET | Freq: Two times a day (BID) | ORAL | 2 refills | Status: DC

## 2020-02-15 DIAGNOSIS — R609 Edema, unspecified: Secondary | ICD-10-CM | POA: Diagnosis not present

## 2020-02-15 DIAGNOSIS — Z880 Allergy status to penicillin: Secondary | ICD-10-CM | POA: Diagnosis not present

## 2020-02-15 DIAGNOSIS — E876 Hypokalemia: Secondary | ICD-10-CM | POA: Diagnosis not present

## 2020-02-15 DIAGNOSIS — R778 Other specified abnormalities of plasma proteins: Secondary | ICD-10-CM | POA: Diagnosis not present

## 2020-02-15 DIAGNOSIS — I10 Essential (primary) hypertension: Secondary | ICD-10-CM | POA: Diagnosis present

## 2020-02-15 DIAGNOSIS — I161 Hypertensive emergency: Secondary | ICD-10-CM | POA: Diagnosis not present

## 2020-02-15 DIAGNOSIS — I160 Hypertensive urgency: Secondary | ICD-10-CM | POA: Diagnosis not present

## 2020-02-15 DIAGNOSIS — I517 Cardiomegaly: Secondary | ICD-10-CM | POA: Diagnosis not present

## 2020-02-15 DIAGNOSIS — I348 Other nonrheumatic mitral valve disorders: Secondary | ICD-10-CM | POA: Diagnosis not present

## 2020-02-15 DIAGNOSIS — R040 Epistaxis: Secondary | ICD-10-CM | POA: Diagnosis not present

## 2020-02-16 DIAGNOSIS — E876 Hypokalemia: Secondary | ICD-10-CM | POA: Diagnosis not present

## 2020-02-16 DIAGNOSIS — Z880 Allergy status to penicillin: Secondary | ICD-10-CM | POA: Diagnosis not present

## 2020-02-16 DIAGNOSIS — I161 Hypertensive emergency: Secondary | ICD-10-CM | POA: Diagnosis not present

## 2020-02-16 DIAGNOSIS — R040 Epistaxis: Secondary | ICD-10-CM | POA: Diagnosis not present

## 2020-02-16 DIAGNOSIS — I10 Essential (primary) hypertension: Secondary | ICD-10-CM | POA: Diagnosis not present

## 2020-02-23 DIAGNOSIS — H3562 Retinal hemorrhage, left eye: Secondary | ICD-10-CM | POA: Diagnosis not present

## 2020-02-23 DIAGNOSIS — H401131 Primary open-angle glaucoma, bilateral, mild stage: Secondary | ICD-10-CM | POA: Diagnosis not present

## 2020-02-23 DIAGNOSIS — H35363 Drusen (degenerative) of macula, bilateral: Secondary | ICD-10-CM | POA: Diagnosis not present

## 2020-02-23 DIAGNOSIS — H2513 Age-related nuclear cataract, bilateral: Secondary | ICD-10-CM | POA: Diagnosis not present

## 2020-02-23 DIAGNOSIS — H35033 Hypertensive retinopathy, bilateral: Secondary | ICD-10-CM | POA: Diagnosis not present

## 2020-02-23 DIAGNOSIS — H43393 Other vitreous opacities, bilateral: Secondary | ICD-10-CM | POA: Diagnosis not present

## 2020-02-23 DIAGNOSIS — H43813 Vitreous degeneration, bilateral: Secondary | ICD-10-CM | POA: Diagnosis not present

## 2020-02-24 ENCOUNTER — Ambulatory Visit (INDEPENDENT_AMBULATORY_CARE_PROVIDER_SITE_OTHER): Payer: Medicare Other | Admitting: Family Medicine

## 2020-02-24 ENCOUNTER — Encounter: Payer: Self-pay | Admitting: Family Medicine

## 2020-02-24 ENCOUNTER — Other Ambulatory Visit: Payer: Self-pay

## 2020-02-24 VITALS — BP 154/88 | HR 90 | Temp 98.4°F | Resp 16 | Ht 64.0 in | Wt 197.6 lb

## 2020-02-24 DIAGNOSIS — J309 Allergic rhinitis, unspecified: Secondary | ICD-10-CM

## 2020-02-24 DIAGNOSIS — M5442 Lumbago with sciatica, left side: Secondary | ICD-10-CM

## 2020-02-24 DIAGNOSIS — E876 Hypokalemia: Secondary | ICD-10-CM | POA: Diagnosis not present

## 2020-02-24 DIAGNOSIS — G8929 Other chronic pain: Secondary | ICD-10-CM

## 2020-02-24 DIAGNOSIS — M5441 Lumbago with sciatica, right side: Secondary | ICD-10-CM

## 2020-02-24 DIAGNOSIS — R040 Epistaxis: Secondary | ICD-10-CM | POA: Diagnosis not present

## 2020-02-24 DIAGNOSIS — I10 Essential (primary) hypertension: Secondary | ICD-10-CM | POA: Diagnosis not present

## 2020-02-24 LAB — BASIC METABOLIC PANEL
BUN: 13 mg/dL (ref 6–23)
CO2: 33 mEq/L — ABNORMAL HIGH (ref 19–32)
Calcium: 9.3 mg/dL (ref 8.4–10.5)
Chloride: 102 mEq/L (ref 96–112)
Creatinine, Ser: 1.01 mg/dL (ref 0.40–1.20)
GFR: 57.54 mL/min — ABNORMAL LOW (ref >60.00)
Glucose, Bld: 87 mg/dL (ref 70–99)
Potassium: 2.9 mEq/L — ABNORMAL LOW (ref 3.5–5.1)
Sodium: 143 mEq/L (ref 135–145)

## 2020-02-24 MED ORDER — LOSARTAN POTASSIUM 100 MG PO TABS: 100.0000 mg | ORAL_TABLET | Freq: Every day | ORAL | 3 refills | Status: DC

## 2020-02-24 MED ORDER — SPIRONOLACTONE 25 MG PO TABS: 25.0000 mg | ORAL_TABLET | Freq: Every day | ORAL | 2 refills | Status: DC

## 2020-02-24 NOTE — Assessment & Plan Note (Signed)
Continue tramadol 50 mg daily as needed. For now no changes in meloxicam 15 mg daily, we had discussed some side effects of chronic NSAID use.

## 2020-02-24 NOTE — Assessment & Plan Note (Signed)
BP is still not adequately controlled. Today HCTZ discontinued. Spironolactone 25 mg added today. Continue losartan 100 mg daily and metoprolol succinate 100 mg daily. Monitor BP daily. We discussed some side effects of medications. Keep next appointment.

## 2020-02-24 NOTE — Progress Notes (Signed)
HPI: [name] is a 70 y.o. female, who is here today to follow on recent hospitalization. She was hospitalized from 02/15/20 to 12. She was shopping with her son when she started with headache and nose bleed. Symptoms started suddenly after smelling a strong perfume/odor. She was taken to the ER because nose bleeding did not stop.  Discharge summery is not yet available at this time.  States that she had a fall in the bathroom during hospitalization.  Hx of allergic rhinitis: Nasal congestion and rhinorrhea. She uses Astelin and Flonase nasal spray daily prn. She also takes Allegra 180 mg daily.  CBC with Differential  Ref Range & Units 10 d ago  WBC 3.6 - 11.7 10*3/uL 9.6   RBC 3.72 - 5.24 10*6/uL 3.89   HEMOGLOBIN 10.8 - 15.5 g/dL 10.3Low   HEMATOCRIT 34 - 46 % 32Low   MCV 80 - 100 fL 82   Mean Corpuscular Hemoglobin (MCH) 26 - 34 pg 26   RDW 12.2 - 16.6 % 16.5   Platelet 153 - 400 10*3/uL 237   MCHC 32 - 35 g/dL 32   MPV 7.3 - 11.2 fL 9.4   Neutrophils % 68   Lymph % 24   Monocytes % 7   Eosinophils % 0   Basophils % 1   Absolute Neut 1.60 - 8.20 10*3/uL 6.60   Absolute Lymph 1.07 - 3.45 10*3/uL 2.30   Absolute Mono 0.20 - 0.85 10*3/uL 0.60   Absolute EOS 0.00 - 0.30 10*3/uL 0.00   Absolute Basos 0.00 - 0.10 10*3/uL 0.10     HTN management was not changed.   Troponin elevated at 32. Mg and phosphorus normal.  (ABNORMAL) Basic Metabolic Panel (09/81/1914 9:06 AM EST) (ABNORMAL) Basic Metabolic Panel (78/29/5621 9:06 AM EST)  Component Value Ref Range Test Method Analysis Time Performed At Pathologist Signature  SODIUM LEVEL 142 134 - 143 mmol/L   MILLENNIUM CONVERSION   Potassium 3.1 (L) 3.5 - 5.1 mmol/L   MILLENNIUM CONVERSION   CHLORIDE LEVEL 105 98 - 107 mmol/L   MILLENNIUM CONVERSION   CO2 30 21 - 31 mmol/L   MILLENNIUM CONVERSION   Anion Gap 7 3 - 15 mmol/L   MILLENNIUM CONVERSION   Glucose Level 110 70 - 125 mg/dL    MILLENNIUM CONVERSION   Blood Urea Nitrogen (BUN) 15 8 - 30 mg/dL   MILLENNIUM CONVERSION   Creatinine 0.87 0.51 - 0.95 mg/dL   MILLENNIUM CONVERSION   Estimated GFR Non-African American >59 >59- mL/min/1.73_m2   MILLENNIUM CONVERSION   Comment: Calculated GFR using IDMS-Traceable MDRD formula.  Calcium Level 8.6 8.6 - 10.3 mg/dL   MILLENNIUM CONVERSION     BP upon ER evaluation, BP was 240/218 (per pt reported). Home BP's 140-150's/80's. She has not had nose bleed since the time of discharge.  HypoK+:She is on KLOR 20 meq bid. Negative for severe/frequent headache, visual changes, chest pain, dyspnea, palpitation, claudication, focal weakness, or edema.  She is on Meloxicam 15 mg daily as needed for chronic pain: Generalized OA and lower back pain with radiation. Last visit Tramadol 50 mg daily prn was started. Tolerating medication well.  Review of Systems  Constitutional: Negative for activity change, appetite change, fatigue and fever.  HENT: Negative for mouth sores, nosebleeds and sore throat.   Eyes: Negative for redness and visual disturbance.  Respiratory: Negative for cough and wheezing.   Gastrointestinal: Negative for abdominal pain, nausea and vomiting.  Negative for changes in bowel habits.  Musculoskeletal: Positive for back pain and gait problem.  Allergic/Immunologic: Positive for environmental allergies.  Neurological: Negative for syncope, facial asymmetry and weakness.  Rest see pertinent positives and negatives per HPI.  Current Outpatient Medications on File Prior to Visit  Medication Sig Dispense Refill  . Ascorbic Acid (VITAMIN C) 100 MG tablet Take 100 mg by mouth daily.    Marland Kitchen aspirin 81 MG tablet Take 81 mg by mouth daily.    . cholecalciferol (VITAMIN D) 1000 UNITS tablet Take 1,000 Units by mouth daily.    . fexofenadine (ALLEGRA) 180 MG tablet TAKE 1 TABLET BY MOUTH EVERY DAY 90 tablet 3  . fluticasone (FLONASE) 50 MCG/ACT nasal spray  Place 1 spray into both nostrils 2 (two) times daily. 16 g 3  . metoprolol succinate (TOPROL-XL) 100 MG 24 hr tablet TAKE 1 TABLET EVERY DAY WITH A MEAL 90 tablet 2  . potassium chloride SA (KLOR-CON M20) 20 MEQ tablet Take 1 tablet (20 mEq total) by mouth 2 (two) times daily. 180 tablet 2  . pravastatin (PRAVACHOL) 20 MG tablet Take 1 tablet (20 mg total) by mouth daily. 90 tablet 3  . traMADol (ULTRAM) 50 MG tablet Take 1 tablet (50 mg total) by mouth daily as needed. 30 tablet 0  . VITAMIN E PO Take 1 tablet by mouth daily.    Marland Kitchen VYZULTA 0.024 % SOLN     . amLODipine (NORVASC) 10 MG tablet Take 10 mg by mouth daily.    Marland Kitchen azelastine (ASTELIN) 0.1 % nasal spray Place 2 sprays into both nostrils 2 (two) times daily. Use in each nostril as directed (Patient not taking: Reported on 02/24/2020) 30 mL 3   No current facility-administered medications on file prior to visit.   Past Medical History:  Diagnosis Date  . High cholesterol   . Hypertension    Allergies  Allergen Reactions  . Penicillins Hives    Has patient had a PCN reaction causing immediate rash, facial/tongue/throat swelling, SOB or lightheadedness with hypotension: Yes Has patient had a PCN reaction causing severe rash involving mucus membranes or skin necrosis: Unknown Has patient had a PCN reaction that required hospitalization: No Has patient had a PCN reaction occurring within the last 10 years: No If all of the above answers are "NO", then may proceed with Cephalosporin use.     Social History   Socioeconomic History  . Marital status: Married    Spouse name: Not on file  . Number of children: Not on file  . Years of education: Not on file  . Highest education level: Not on file  Occupational History  . Not on file  Tobacco Use  . Smoking status: Never Smoker  . Smokeless tobacco: Never Used  Substance and Sexual Activity  . Alcohol use: No  . Drug use: No  . Sexual activity: Never  Other Topics Concern  .  Not on file  Social History Narrative  . Not on file   Social Determinants of Health   Financial Resource Strain: Not on file  Food Insecurity: Not on file  Transportation Needs: Not on file  Physical Activity: Not on file  Stress: Not on file  Social Connections: Not on file    Vitals:   02/24/20 0922  BP: (!) 154/88  Pulse: 90  Resp: 16  Temp: 98.4 F (36.9 C)  SpO2: 94%   Body mass index is 33.92 kg/m.   Physical Exam Vitals and nursing  note reviewed.  Constitutional:      General: She is not in acute distress.    Appearance: She is well-developed.  HENT:     Head: Normocephalic and atraumatic.     Nose:     Left Nostril: No epistaxis.     Right Turbinates: Enlarged.     Left Turbinates: Enlarged.     Comments: Hyperemic nose mucosa.    Mouth/Throat:     Mouth: Oropharynx is clear and moist and mucous membranes are normal. Mucous membranes are moist.     Pharynx: Oropharynx is clear.  Eyes:     Conjunctiva/sclera: Conjunctivae normal.     Pupils: Pupils are equal, round, and reactive to light.  Cardiovascular:     Rate and Rhythm: Normal rate and regular rhythm.     Pulses:          Dorsalis pedis pulses are 2+ on the right side and 2+ on the left side.     Heart sounds: Murmur (SEM I/VI LUSB and RUSB) heard.    Pulmonary:     Effort: Pulmonary effort is normal. No respiratory distress.     Breath sounds: Normal breath sounds.  Abdominal:     Palpations: Abdomen is soft. There is no hepatomegaly or mass.     Tenderness: There is no abdominal tenderness.  Musculoskeletal:        General: No edema.  Lymphadenopathy:     Cervical: No cervical adenopathy.  Skin:    General: Skin is warm.     Findings: No erythema or rash.  Neurological:     General: No focal deficit present.     Mental Status: She is alert and oriented to person, place, and time.     Cranial Nerves: No cranial nerve deficit.     Deep Tendon Reflexes: Strength normal.     Comments:  Antalgic gait, assisted by a cane.  Psychiatric:        Mood and Affect: Mood and affect normal.     Comments: Well groomed, good eye contact.    ASSESSMENT AND PLAN:  Ms.Nikola was seen today for hospitalization follow-up.  Diagnoses and all orders for this visit:  Orders Placed This Encounter  Procedures  . Basic metabolic panel  . Basic metabolic panel   Lab Results  Component Value Date   CREATININE 1.01 02/24/2020   BUN 13 02/24/2020   NA 143 02/24/2020   K 2.9 (L) 02/24/2020   CL 102 02/24/2020   CO2 33 (H) 02/24/2020   Epistaxis Resolved. Rhinitis could be a contributing factor. Instructed about warning signs.  Essential hypertension, benign BP is still not adequately controlled. Today HCTZ discontinued. Spironolactone 25 mg added today. Continue losartan 100 mg daily and metoprolol succinate 100 mg daily. Monitor BP daily. We discussed some side effects of medications. Keep next appointment.  Hypokalemia Persistent despite of K-Lor supplementation. HCTZ discontinued today. For now continue K-Lor 20 mEq twice daily. Spironolactone added today. Further recommendation will be given according to BMP results. BMP to be repeated in 10 to 14 days.  Chronic back pain Continue tramadol 50 mg daily as needed. For now no changes in meloxicam 15 mg daily, we had discussed some side effects of chronic NSAID use.  Allergic rhinitis This could have the precipitating factor for nose bleed. Continue Flonase nasal spray as needed for now. Nasal irrigations as needed.   Spent 44 minutes.  During this time history was obtained and documented, examination was performed, prior labs/imaging reviewed,  and assessment/plan discussed.  Return for lab in 10-14 days. Keep next appt.   Jaidan Prevette G. [place], MD  [facility]. [place] [facility].  A few things to remember from today's visit:   Essential hypertension, benign - Plan: losartan (COZAAR) 100 MG tablet,  spironolactone (ALDACTONE) 25 MG tablet, Basic metabolic panel  Hypokalemia  Chronic bilateral low back pain with bilateral sciatica  Epistaxis  If you need refills please call your pharmacy. Do not use My Chart to request refills or for acute issues that need immediate attention.   Today we stopped hydrochlorothiazide and started Spironolactone. No changes in rest of your meds. We need to repeat potassium test in 10-14 days.  Continue monitoring blood pressure daily.  Please be sure medication list is accurate. If a new problem present, please set up appointment sooner than planned today.

## 2020-02-24 NOTE — Patient Instructions (Addendum)
A few things to remember from today's visit:   Essential hypertension, benign - Plan: losartan (COZAAR) 100 MG tablet, spironolactone (ALDACTONE) 25 MG tablet, Basic metabolic panel  Hypokalemia  Chronic bilateral low back pain with bilateral sciatica  Epistaxis  If you need refills please call your pharmacy. Do not use My Chart to request refills or for acute issues that need immediate attention.   Today we stopped hydrochlorothiazide and started Spironolactone. No changes in rest of your meds. We need to repeat potassium test in 10-14 days.  Continue monitoring blood pressure daily.  Please be sure medication list is accurate. If a new problem present, please set up appointment sooner than planned today.

## 2020-02-24 NOTE — Assessment & Plan Note (Signed)
Persistent despite of K-Lor supplementation. HCTZ discontinued today. For now continue K-Lor 20 mEq twice daily. Spironolactone added today. Further recommendation will be given according to BMP results. BMP to be repeated in 10 to 14 days.

## 2020-02-24 NOTE — Assessment & Plan Note (Signed)
This could have the precipitating factor for nose bleed. Continue Flonase nasal spray as needed for now. Nasal irrigations as needed.

## 2020-02-25 ENCOUNTER — Other Ambulatory Visit: Payer: Self-pay | Admitting: Family Medicine

## 2020-03-09 ENCOUNTER — Other Ambulatory Visit: Payer: Medicare Other

## 2020-03-23 ENCOUNTER — Ambulatory Visit: Payer: Medicare Other | Admitting: Family Medicine

## 2020-03-25 ENCOUNTER — Other Ambulatory Visit: Payer: Self-pay

## 2020-03-25 ENCOUNTER — Encounter: Payer: Self-pay | Admitting: Family Medicine

## 2020-03-25 ENCOUNTER — Ambulatory Visit: Payer: Medicare Other | Admitting: Family Medicine

## 2020-03-25 ENCOUNTER — Ambulatory Visit (INDEPENDENT_AMBULATORY_CARE_PROVIDER_SITE_OTHER): Payer: Medicare Other | Admitting: Family Medicine

## 2020-03-25 VITALS — BP 128/70 | HR 79 | Resp 16 | Ht 64.0 in | Wt 195.1 lb

## 2020-03-25 DIAGNOSIS — E876 Hypokalemia: Secondary | ICD-10-CM

## 2020-03-25 DIAGNOSIS — R9431 Abnormal electrocardiogram [ECG] [EKG]: Secondary | ICD-10-CM

## 2020-03-25 DIAGNOSIS — I499 Cardiac arrhythmia, unspecified: Secondary | ICD-10-CM | POA: Diagnosis not present

## 2020-03-25 DIAGNOSIS — H8110 Benign paroxysmal vertigo, unspecified ear: Secondary | ICD-10-CM | POA: Diagnosis not present

## 2020-03-25 DIAGNOSIS — R112 Nausea with vomiting, unspecified: Secondary | ICD-10-CM | POA: Diagnosis not present

## 2020-03-25 DIAGNOSIS — I10 Essential (primary) hypertension: Secondary | ICD-10-CM | POA: Diagnosis not present

## 2020-03-25 LAB — BASIC METABOLIC PANEL
BUN: 18 mg/dL (ref 6–23)
CO2: 26 mEq/L (ref 19–32)
Calcium: 9.7 mg/dL (ref 8.4–10.5)
Chloride: 109 mEq/L (ref 96–112)
Creatinine, Ser: 1.22 mg/dL — ABNORMAL HIGH (ref 0.40–1.20)
GFR: 45.85 mL/min — ABNORMAL LOW (ref >60.00)
Glucose, Bld: 89 mg/dL (ref 70–99)
Potassium: 3.4 mEq/L — ABNORMAL LOW (ref 3.5–5.1)
Sodium: 144 mEq/L (ref 135–145)

## 2020-03-25 LAB — CBC
HCT: 37.4 % (ref 36.0–46.0)
Hemoglobin: 12.1 g/dL (ref 12.0–15.0)
MCHC: 32.3 g/dL (ref 30.0–36.0)
MCV: 81.2 fl (ref 78.0–100.0)
Platelets: 348 10*3/uL (ref 150.0–400.0)
RBC: 4.6 Mil/uL (ref 3.87–5.11)
RDW: 16.1 % — ABNORMAL HIGH (ref 11.5–15.5)
WBC: 6.2 10*3/uL (ref 4.0–10.5)

## 2020-03-25 MED ORDER — MECLIZINE HCL 25 MG PO TABS: 25.0000 mg | ORAL_TABLET | Freq: Two times a day (BID) | ORAL | 0 refills | Status: DC | PRN

## 2020-03-25 MED ORDER — ONDANSETRON HCL 4 MG PO TABS: 4.0000 mg | ORAL_TABLET | Freq: Two times a day (BID) | ORAL | 0 refills | Status: AC | PRN

## 2020-03-25 NOTE — Patient Instructions (Signed)
A few things to remember from today's visit:   Essential hypertension, benign - Plan: EKG 12-Lead  Hypokalemia  Benign paroxysmal vertigo, unspecified laterality - Plan: CBC  Irregular heart rate  Nausea and vomiting in adult  If you need refills please call your pharmacy. Do not use My Chart to request refills or for acute issues that need immediate attention.    Benign Positional Vertigo Vertigo is the feeling that you or your surroundings are moving when they are not. Benign positional vertigo is the most common form of vertigo. This is usually a harmless condition (benign). This condition is positional. This means that symptoms are triggered by certain movements and positions. This condition can be dangerous if it occurs while you are doing something that could cause harm to you or others. This includes activities such as driving or operating machinery. What are the causes? The inner ear has fluid-filled canals that help your brain sense movement and balance. When the fluid moves, the brain receives messages about your body's position. With benign positional vertigo, crystals in the inner ear break free and disturb the inner ear area. This causes your brain to receive confusing messages about your body's position. What increases the risk? You are more likely to develop this condition if:  You are a woman.  You are 65 years of age or older.  You have recently had a head injury.  You have an inner ear disease. What are the signs or symptoms? Symptoms of this condition usually happen when you move your head or your eyes in different directions. Symptoms may start suddenly, and usually last for less than a minute. They include:  Loss of balance and falling.  Feeling like you are spinning or moving.  Feeling like your surroundings are spinning or moving.  Nausea and vomiting.  Blurred vision.  Dizziness.  Involuntary eye movement (nystagmus). Symptoms can be mild and  cause only minor problems, or they can be severe and interfere with daily life. Episodes of benign positional vertigo may return (recur) over time. Symptoms may improve over time. How is this diagnosed? This condition may be diagnosed based on:  Your medical history.  Physical exam of the head, neck, and ears.  Positional tests to check for or stimulate vertigo. You may be asked to turn your head and change positions, such as going from sitting to lying down. A health care provider will watch for symptoms of vertigo. You may be referred to a health care provider who specializes in ear, nose, and throat problems (ENT, or otolaryngologist) or a provider who specializes in disorders of the nervous system (neurologist). How is this treated? This condition may be treated in a session in which your health care provider moves your head in specific positions to help the displaced crystals in your inner ear move. Treatment for this condition may take several sessions. Surgery may be needed in severe cases, but this is rare. In some cases, benign positional vertigo may resolve on its own in 2-4 weeks.   Follow these instructions at home: Safety  Move slowly. Avoid sudden body or head movements or certain positions, as told by your health care provider.  Avoid driving until your health care provider says it is safe for you to do so.  Avoid operating heavy machinery until your health care provider says it is safe for you to do so.  Avoid doing any tasks that would be dangerous to you or others if vertigo occurs.  If you have  trouble walking or keeping your balance, try using a cane for stability. If you feel dizzy or unstable, sit down right away.  Return to your normal activities as told by your health care provider. Ask your health care provider what activities are safe for you. General instructions  Take over-the-counter and prescription medicines only as told by your health care  provider.  Drink enough fluid to keep your urine pale yellow.  Keep all follow-up visits as told by your health care provider. This is important. Contact a health care provider if:  You have a fever.  Your condition gets worse or you develop new symptoms.  Your family or friends notice any behavioral changes.  You have nausea or vomiting that gets worse.  You have numbness or a prickling and tingling sensation. Get help right away if you:  Have difficulty speaking or moving.  Are always dizzy.  Faint.  Develop severe headaches.  Have weakness in your legs or arms.  Have changes in your hearing or vision.  Develop a stiff neck.  Develop sensitivity to light. Summary  Vertigo is the feeling that you or your surroundings are moving when they are not. Benign positional vertigo is the most common form of vertigo.  This condition is caused by crystals in the inner ear that become displaced. This causes a disturbance in an area of the inner ear that helps your brain sense movement and balance.  Symptoms include loss of balance and falling, feeling that you or your surroundings are moving, nausea and vomiting, and blurred vision.  This condition can be diagnosed based on symptoms, a physical exam, and positional tests.  Follow safety instructions as told by your health care provider. You will also be told when to contact your health care provider in case of problems. This information is not intended to replace advice given to you by your health care provider. Make sure you discuss any questions you have with your health care provider. Document Revised: 12/30/2018 Document Reviewed: 07/17/2017 Elsevier Patient Education  2021 [employer]  Please be sure medication list is accurate. If a new problem present, please set up appointment sooner than planned today.

## 2020-03-25 NOTE — Progress Notes (Signed)
Chief Complaint  Patient presents with  . Dizziness   HPI: [name] is a 70 y.o. female, who is here today with above complaint. This is a new problems first noted 5 days ago, Monday, when she woke up and tried to move to the center of her bed. Described it as spinning sensation, ceiling spinning. No associated symptoms except for nausea and one episode of vomiting after eating breakfast.  Negative for hearing changes or tinnitus. Episodes are intermittent. Exacerbated by movement and alleviated by rest. Last a few minutes, 15-20 min.  She has not tried OTC medications. No recent illness or travel. Hx of allergic rhinitis, no more symptoms than usual. Negative for new meds or dietary changes.  HTN: She is on Spironolactone 25 mg daily, Metoprolol succinate 100 mg daily,and Amlodipine 10 mg daily. BP at home 130's/80's.  HypoK+; She is on KLOR 20 meq bid.  Lab Results  Component Value Date   CREATININE 1.01 02/24/2020   BUN 13 02/24/2020   NA 143 02/24/2020   K 2.9 (L) 02/24/2020   CL 102 02/24/2020   CO2 33 (H) 02/24/2020   Lab Results  Component Value Date   WBC 7.4 12/02/2016   HGB 13.1 12/02/2016   HCT 41.6 12/02/2016   MCV 81.1 12/02/2016   PLT 349 12/02/2016   During auscultation today I noted transient irregular HR. She has not noted palpitations,CP,or SOB. Hx of abnormal EKG, states that she has not seen cardiologist before.  Review of Systems  Constitutional: Negative for activity change, appetite change and fever.  HENT: Positive for postnasal drip and rhinorrhea. Negative for mouth sores, nosebleeds and sore throat.   Eyes: Negative for redness and visual disturbance.  Respiratory: Negative for cough and wheezing.   Gastrointestinal: Negative for abdominal pain.       Negative for changes in bowel habits.  Genitourinary: Negative for decreased urine volume, dysuria and hematuria.  Allergic/Immunologic: Positive for environmental  allergies.  Neurological: Negative for syncope, weakness and headaches.  Psychiatric/Behavioral: Negative for confusion and hallucinations.  Rest see pertinent positives and negatives per HPI.  Current Outpatient Medications on File Prior to Visit  Medication Sig Dispense Refill  . amLODipine (NORVASC) 10 MG tablet Take 10 mg by mouth daily.    . Ascorbic Acid (VITAMIN C) 100 MG tablet Take 100 mg by mouth daily.    Marland Kitchen aspirin 81 MG tablet Take 81 mg by mouth daily.    Marland Kitchen azelastine (ASTELIN) 0.1 % nasal spray Place 2 sprays into both nostrils 2 (two) times daily. Use in each nostril as directed 30 mL 3  . cholecalciferol (VITAMIN D) 1000 UNITS tablet Take 1,000 Units by mouth daily.    . fexofenadine (ALLEGRA) 180 MG tablet TAKE 1 TABLET BY MOUTH EVERY DAY 90 tablet 3  . fluticasone (FLONASE) 50 MCG/ACT nasal spray Place 1 spray into both nostrils 2 (two) times daily. 16 g 3  . losartan (COZAAR) 100 MG tablet Take 1 tablet (100 mg total) by mouth daily. 30 tablet 3  . meloxicam (MOBIC) 15 MG tablet TAKE 1 TABLET BY MOUTH EVERY DAY 90 tablet 0  . metoprolol succinate (TOPROL-XL) 100 MG 24 hr tablet TAKE 1 TABLET EVERY DAY WITH A MEAL 90 tablet 2  . potassium chloride SA (KLOR-CON M20) 20 MEQ tablet Take 1 tablet (20 mEq total) by mouth 2 (two) times daily. 180 tablet 2  . pravastatin (PRAVACHOL) 20 MG tablet Take 1 tablet (20 mg total) by mouth  daily. 90 tablet 3  . spironolactone (ALDACTONE) 25 MG tablet Take 1 tablet (25 mg total) by mouth daily. 30 tablet 2  . traMADol (ULTRAM) 50 MG tablet Take 1 tablet (50 mg total) by mouth daily as needed. 30 tablet 0  . VITAMIN E PO Take 1 tablet by mouth daily.    Marland Kitchen VYZULTA 0.024 % SOLN      No current facility-administered medications on file prior to visit.   Past Medical History:  Diagnosis Date  . High cholesterol   . Hypertension    Allergies  Allergen Reactions  . Penicillins Hives    Has patient had a PCN reaction causing immediate  rash, facial/tongue/throat swelling, SOB or lightheadedness with hypotension: Yes Has patient had a PCN reaction causing severe rash involving mucus membranes or skin necrosis: Unknown Has patient had a PCN reaction that required hospitalization: No Has patient had a PCN reaction occurring within the last 10 years: No If all of the above answers are "NO", then may proceed with Cephalosporin use.     Social History   Socioeconomic History  . Marital status: Married    Spouse name: Not on file  . Number of children: Not on file  . Years of education: Not on file  . Highest education level: Not on file  Occupational History  . Not on file  Tobacco Use  . Smoking status: Never Smoker  . Smokeless tobacco: Never Used  Substance and Sexual Activity  . Alcohol use: No  . Drug use: No  . Sexual activity: Never  Other Topics Concern  . Not on file  Social History Narrative  . Not on file   Social Determinants of Health   Financial Resource Strain: Not on file  Food Insecurity: Not on file  Transportation Needs: Not on file  Physical Activity: Not on file  Stress: Not on file  Social Connections: Not on file   Vitals:   03/25/20 0939  BP: 128/70  Pulse: 79  Resp: 16  SpO2: 97%   Body mass index is 33.49 kg/m.  Physical Exam Vitals and nursing note reviewed.  Constitutional:      General: She is not in acute distress.    Appearance: She is well-developed.  HENT:     Head: Normocephalic and atraumatic.     Ears:     Comments: Apley maneuver and Dix-Hallpike maneuver negative. Nystagmus present.    Mouth/Throat:     Mouth: Oropharynx is clear and moist and mucous membranes are normal. Mucous membranes are moist.     Pharynx: Oropharynx is clear.  Eyes:     Conjunctiva/sclera: Conjunctivae normal.  Neck:     Vascular: No carotid bruit.  Cardiovascular:     Rate and Rhythm: Normal rate. Rhythm irregular. Occasional extrasystoles are present.    Pulses:           Dorsalis pedis pulses are 2+ on the right side and 2+ on the left side.     Heart sounds: Murmur (? Soft SEM RUSB-LUSB) heard.    Pulmonary:     Effort: Pulmonary effort is normal. No respiratory distress.     Breath sounds: Normal breath sounds.  Abdominal:     Palpations: Abdomen is soft. There is no hepatomegaly or mass.     Tenderness: There is no abdominal tenderness.  Musculoskeletal:        General: No edema.  Lymphadenopathy:     Cervical: No cervical adenopathy.  Skin:    General:  Skin is warm.     Findings: No erythema or rash.  Neurological:     Mental Status: She is alert and oriented to person, place, and time.     Cranial Nerves: No cranial nerve deficit.     Gait: Gait normal.     Deep Tendon Reflexes: Strength normal.  Psychiatric:        Mood and Affect: Mood and affect normal.     Comments: Well groomed, good eye contact.   ASSESSMENT AND PLAN:  Ms.Bonnell was seen today for dizziness.  Diagnoses and all orders for this visit: Orders Placed This Encounter  Procedures  . CBC  . Ambulatory referral to Cardiology  . EKG 12-Lead   Lab Results  Component Value Date   CREATININE 1.22 (H) 03/25/2020   BUN 18 03/25/2020   NA 144 03/25/2020   K 3.4 (L) 03/25/2020   CL 109 03/25/2020   CO2 26 03/25/2020   Lab Results  Component Value Date   WBC 6.2 03/25/2020   HGB 12.1 03/25/2020   HCT 37.4 03/25/2020   MCV 81.2 03/25/2020   PLT 348.0 03/25/2020   Benign paroxysmal vertigo, unspecified laterality We discussed other possible etiologies of dizziness, Hx and examination today suggest benign vertigo. Explained that problem can be recurrent. Reviewing imaging found brain MRI done in 11/2016 because headache and vertigo.Chronic small vessel disease, negative otherwise. Fall prevention. Vestibular exercises recommended,she prefers to hold on PT evaluation; handout with [place] maneuvers given. Meclizine 25 mg bid prn, some side effects  discussed. Instructed about warning signs. F/U as needed.  -     meclizine (ANTIVERT) 25 MG tablet; Take 1 tablet (25 mg total) by mouth 2 (two) times daily as needed for dizziness. -     ondansetron (ZOFRAN) 4 MG tablet; Take 1 tablet (4 mg total) by mouth every 12 (twelve) hours as needed for up to 5 days for nausea or vomiting.  Essential hypertension, benign BP adequately controlled. No changes in dose of Spironolactone,Amlodipine, or metoprolol succinate dose. Low salt diet. Continue monitoring BP regularly.  Hypokalemia Continue KLOR 20 meq bid. Further recommendations according to BMP results.  Irregular heart rate Noted during auscultation today. I do not think this is causing dizziness. EKG today: SR,LAD,minimal unspecific ST changes,unspecific T changes in inferior leads.Unspecific IVCD and some antero- lateral T wave abnormalities not longer present when compared with EKG 12/02/2016.  Nausea and vomiting in adult Adequate hydration. Zofran 4 mg bid prn recommended.  -     ondansetron (ZOFRAN) 4 MG tablet; Take 1 tablet (4 mg total) by mouth every 12 (twelve) hours as needed for up to 5 days for nausea or vomiting.  Abnormal EKG Asymptomatic, no known hx of CAD. Discussed EKG findings, she would like cardiology evaluation. Instructed about warning signs.   Spent 41 minutes with pt.  During this time history was obtained and documented, examination was performed, prior labs/imaging reviewed, and assessment/plan discussed.  Return in about 2 weeks (around 04/08/2020).   Natividad Halls G. [place], MD  [facility]. [place] office.   A few things to remember from today's visit:   Essential hypertension, benign - Plan: EKG 12-Lead  Hypokalemia  Benign paroxysmal vertigo, unspecified laterality - Plan: CBC  Irregular heart rate  Nausea and vomiting in adult  If you need refills please call your pharmacy. Do not use My Chart to request refills or for acute  issues that need immediate attention.    Benign Positional Vertigo Vertigo is the  feeling that you or your surroundings are moving when they are not. Benign positional vertigo is the most common form of vertigo. This is usually a harmless condition (benign). This condition is positional. This means that symptoms are triggered by certain movements and positions. This condition can be dangerous if it occurs while you are doing something that could cause harm to you or others. This includes activities such as driving or operating machinery. What are the causes? The inner ear has fluid-filled canals that help your brain sense movement and balance. When the fluid moves, the brain receives messages about your body's position. With benign positional vertigo, crystals in the inner ear break free and disturb the inner ear area. This causes your brain to receive confusing messages about your body's position. What increases the risk? You are more likely to develop this condition if:  You are a woman.  You are 75 years of age or older.  You have recently had a head injury.  You have an inner ear disease. What are the signs or symptoms? Symptoms of this condition usually happen when you move your head or your eyes in different directions. Symptoms may start suddenly, and usually last for less than a minute. They include:  Loss of balance and falling.  Feeling like you are spinning or moving.  Feeling like your surroundings are spinning or moving.  Nausea and vomiting.  Blurred vision.  Dizziness.  Involuntary eye movement (nystagmus). Symptoms can be mild and cause only minor problems, or they can be severe and interfere with daily life. Episodes of benign positional vertigo may return (recur) over time. Symptoms may improve over time. How is this diagnosed? This condition may be diagnosed based on:  Your medical history.  Physical exam of the head, neck, and ears.  Positional tests to  check for or stimulate vertigo. You may be asked to turn your head and change positions, such as going from sitting to lying down. A health care provider will watch for symptoms of vertigo. You may be referred to a health care provider who specializes in ear, nose, and throat problems (ENT, or otolaryngologist) or a provider who specializes in disorders of the nervous system (neurologist). How is this treated? This condition may be treated in a session in which your health care provider moves your head in specific positions to help the displaced crystals in your inner ear move. Treatment for this condition may take several sessions. Surgery may be needed in severe cases, but this is rare. In some cases, benign positional vertigo may resolve on its own in 2-4 weeks.   Follow these instructions at home: Safety  Move slowly. Avoid sudden body or head movements or certain positions, as told by your health care provider.  Avoid driving until your health care provider says it is safe for you to do so.  Avoid operating heavy machinery until your health care provider says it is safe for you to do so.  Avoid doing any tasks that would be dangerous to you or others if vertigo occurs.  If you have trouble walking or keeping your balance, try using a cane for stability. If you feel dizzy or unstable, sit down right away.  Return to your normal activities as told by your health care provider. Ask your health care provider what activities are safe for you. General instructions  Take over-the-counter and prescription medicines only as told by your health care provider.  Drink enough fluid to keep your urine pale yellow.  Keep all follow-up visits as told by your health care provider. This is important. Contact a health care provider if:  You have a fever.  Your condition gets worse or you develop new symptoms.  Your family or friends notice any behavioral changes.  You have nausea or vomiting that  gets worse.  You have numbness or a prickling and tingling sensation. Get help right away if you:  Have difficulty speaking or moving.  Are always dizzy.  Faint.  Develop severe headaches.  Have weakness in your legs or arms.  Have changes in your hearing or vision.  Develop a stiff neck.  Develop sensitivity to light. Summary  Vertigo is the feeling that you or your surroundings are moving when they are not. Benign positional vertigo is the most common form of vertigo.  This condition is caused by crystals in the inner ear that become displaced. This causes a disturbance in an area of the inner ear that helps your brain sense movement and balance.  Symptoms include loss of balance and falling, feeling that you or your surroundings are moving, nausea and vomiting, and blurred vision.  This condition can be diagnosed based on symptoms, a physical exam, and positional tests.  Follow safety instructions as told by your health care provider. You will also be told when to contact your health care provider in case of problems. This information is not intended to replace advice given to you by your health care provider. Make sure you discuss any questions you have with your health care provider. Document Revised: 12/30/2018 Document Reviewed: 07/17/2017 Elsevier Patient Education  2021 [employer].  Please be sure medication list is accurate. If a new problem present, please set up appointment sooner than planned today.

## 2020-03-28 ENCOUNTER — Other Ambulatory Visit (INDEPENDENT_AMBULATORY_CARE_PROVIDER_SITE_OTHER): Payer: Medicare Other

## 2020-03-28 ENCOUNTER — Telehealth: Payer: Self-pay | Admitting: Family Medicine

## 2020-03-28 DIAGNOSIS — H8110 Benign paroxysmal vertigo, unspecified ear: Secondary | ICD-10-CM | POA: Diagnosis not present

## 2020-03-28 LAB — TSH: TSH: 1.25 u[IU]/mL (ref 0.35–4.50)

## 2020-03-28 NOTE — Telephone Encounter (Signed)
Pt is calling in to get her lab results 

## 2020-03-28 NOTE — Progress Notes (Unsigned)
sh

## 2020-03-28 NOTE — Telephone Encounter (Signed)
See result note.  

## 2020-03-29 DIAGNOSIS — H2513 Age-related nuclear cataract, bilateral: Secondary | ICD-10-CM | POA: Diagnosis not present

## 2020-03-29 DIAGNOSIS — H401131 Primary open-angle glaucoma, bilateral, mild stage: Secondary | ICD-10-CM | POA: Diagnosis not present

## 2020-03-29 DIAGNOSIS — H3562 Retinal hemorrhage, left eye: Secondary | ICD-10-CM | POA: Diagnosis not present

## 2020-03-29 DIAGNOSIS — H35363 Drusen (degenerative) of macula, bilateral: Secondary | ICD-10-CM | POA: Diagnosis not present

## 2020-03-29 DIAGNOSIS — H43393 Other vitreous opacities, bilateral: Secondary | ICD-10-CM | POA: Diagnosis not present

## 2020-03-29 DIAGNOSIS — H35033 Hypertensive retinopathy, bilateral: Secondary | ICD-10-CM | POA: Diagnosis not present

## 2020-03-29 DIAGNOSIS — H43813 Vitreous degeneration, bilateral: Secondary | ICD-10-CM | POA: Diagnosis not present

## 2020-04-05 ENCOUNTER — Other Ambulatory Visit: Payer: Self-pay

## 2020-04-06 ENCOUNTER — Encounter: Payer: Self-pay | Admitting: Family Medicine

## 2020-04-06 ENCOUNTER — Ambulatory Visit (INDEPENDENT_AMBULATORY_CARE_PROVIDER_SITE_OTHER): Payer: Medicare Other | Admitting: Family Medicine

## 2020-04-06 VITALS — BP 124/80 | HR 73 | Resp 16 | Ht 64.0 in | Wt 197.0 lb

## 2020-04-06 DIAGNOSIS — E876 Hypokalemia: Secondary | ICD-10-CM | POA: Diagnosis not present

## 2020-04-06 DIAGNOSIS — M5441 Lumbago with sciatica, right side: Secondary | ICD-10-CM

## 2020-04-06 DIAGNOSIS — M5442 Lumbago with sciatica, left side: Secondary | ICD-10-CM

## 2020-04-06 DIAGNOSIS — R944 Abnormal results of kidney function studies: Secondary | ICD-10-CM

## 2020-04-06 DIAGNOSIS — I10 Essential (primary) hypertension: Secondary | ICD-10-CM

## 2020-04-06 DIAGNOSIS — H8110 Benign paroxysmal vertigo, unspecified ear: Secondary | ICD-10-CM

## 2020-04-06 DIAGNOSIS — G8929 Other chronic pain: Secondary | ICD-10-CM | POA: Diagnosis not present

## 2020-04-06 NOTE — Assessment & Plan Note (Signed)
BP adequately controlled. Continue Losartan 100 mg daily,Spironolactone 25 mg daily,and Metoprolol succinate 100 mg daily. Low salt diet. Continue monitoring BP regularly.

## 2020-04-06 NOTE — Patient Instructions (Addendum)
A few things to remember from today's visit:  If you need refills please call your pharmacy. Do not use My Chart to request refills or for acute issues that need immediate attention.   Keep appt with cardiologist. Caution with potassium rich food. Increase fluid intake. Continue monitoring blood pressure. Tramadol can be taken with Tylenol. We will re-check kidney test next visit. No changes in blood pressure medications.  Please be sure medication list is accurate. If a new problem present, please set up appointment sooner than planned today.

## 2020-04-06 NOTE — Assessment & Plan Note (Signed)
Improved but not resolved. It is mild,so no changes in KLOR dose. Caution with K+ containing foods. No changes in Spironolactone.

## 2020-04-06 NOTE — Assessment & Plan Note (Signed)
Continue with Tramadol 50 mg daily prn and Tylenol 500 mg 3-4 times daily.

## 2020-04-06 NOTE — Progress Notes (Signed)
HPI:  Meghan Nichols is a 70 y.o. female, who is here today to follow on recent OV/ER.  Dizziness has resolved.  HTN: Checking BP at home, 120's/80's. Negative for severe/frequent headache, visual changes, chest pain, dyspnea, palpitation, claudication, focal weakness, or edema. She is on Losartan 100 mg daily,Spironolactone 25 mg daily,and Metoprolol succinate 100 mg daily.   Lab Results  Component Value Date   CREATININE 1.22 (H) 03/25/2020   BUN 18 03/25/2020   NA 144 03/25/2020   K 3.4 (L) 03/25/2020   CL 109 03/25/2020   CO2 26 03/25/2020   Hyper+: She is on KLOR 20 meq bid. Renal function mildly abnormal. She has not noted decreased urine output,gross hematuria,or foam in urine.  Chronic back pain: Meloxicam has been discontinued. She is on Tramadol 50 mg daily prn. Occasionally pain is radiated to LLE. No saddle anesthesia or bowel/bladder dysfunction.  Review of Systems  Constitutional: Negative for activity change, appetite change, fatigue and fever.  HENT: Negative for mouth sores, nosebleeds and sore throat.   Respiratory: Negative for cough and wheezing.   Gastrointestinal: Negative for abdominal pain, nausea and vomiting.       Negative for changes in bowel habits.  Genitourinary: Negative for decreased urine volume and hematuria.  Musculoskeletal: Positive for back pain.  Neurological: Negative for syncope, facial asymmetry and weakness.  Psychiatric/Behavioral: Negative for confusion.  Rest see pertinent positives and negatives per HPI.  Current Outpatient Medications on File Prior to Visit  Medication Sig Dispense Refill  . amLODipine (NORVASC) 10 MG tablet Take 10 mg by mouth daily.    . Ascorbic Acid (VITAMIN C) 100 MG tablet Take 100 mg by mouth daily.    Marland Kitchen aspirin 81 MG tablet Take 81 mg by mouth daily.    Marland Kitchen azelastine (ASTELIN) 0.1 % nasal spray Place 2 sprays into both nostrils 2 (two) times daily. Use in each nostril as directed 30 mL 3  .  cholecalciferol (VITAMIN D) 1000 UNITS tablet Take 1,000 Units by mouth daily.    . fexofenadine (ALLEGRA) 180 MG tablet TAKE 1 TABLET BY MOUTH EVERY DAY 90 tablet 3  . fluticasone (FLONASE) 50 MCG/ACT nasal spray Place 1 spray into both nostrils 2 (two) times daily. 16 g 3  . losartan (COZAAR) 100 MG tablet Take 1 tablet (100 mg total) by mouth daily. 30 tablet 3  . meclizine (ANTIVERT) 25 MG tablet Take 1 tablet (25 mg total) by mouth 2 (two) times daily as needed for dizziness. 30 tablet 0  . metoprolol succinate (TOPROL-XL) 100 MG 24 hr tablet TAKE 1 TABLET EVERY DAY WITH A MEAL 90 tablet 2  . potassium chloride SA (KLOR-CON M20) 20 MEQ tablet Take 1 tablet (20 mEq total) by mouth 2 (two) times daily. 180 tablet 2  . pravastatin (PRAVACHOL) 20 MG tablet Take 1 tablet (20 mg total) by mouth daily. 90 tablet 3  . spironolactone (ALDACTONE) 25 MG tablet Take 1 tablet (25 mg total) by mouth daily. 30 tablet 2  . traMADol (ULTRAM) 50 MG tablet Take 1 tablet (50 mg total) by mouth daily as needed. 30 tablet 0  . VITAMIN E PO Take 1 tablet by mouth daily.    Marland Kitchen VYZULTA 0.024 % SOLN      No current facility-administered medications on file prior to visit.     Past Medical History:  Diagnosis Date  . High cholesterol   . Hypertension    Allergies  Allergen Reactions  .  Penicillins Hives    Has patient had a PCN reaction causing immediate rash, facial/tongue/throat swelling, SOB or lightheadedness with hypotension: Yes Has patient had a PCN reaction causing severe rash involving mucus membranes or skin necrosis: Unknown Has patient had a PCN reaction that required hospitalization: No Has patient had a PCN reaction occurring within the last 10 years: No If all of the above answers are "NO", then may proceed with Cephalosporin use.     Social History   Socioeconomic History  . Marital status: Married    Spouse name: Not on file  . Number of children: Not on file  . Years of education:  Not on file  . Highest education level: Not on file  Occupational History  . Not on file  Tobacco Use  . Smoking status: Never Smoker  . Smokeless tobacco: Never Used  Substance and Sexual Activity  . Alcohol use: No  . Drug use: No  . Sexual activity: Never  Other Topics Concern  . Not on file  Social History Narrative  . Not on file   Social Determinants of Health   Financial Resource Strain: Not on file  Food Insecurity: Not on file  Transportation Needs: Not on file  Physical Activity: Not on file  Stress: Not on file  Social Connections: Not on file    Vitals:   04/06/20 1006  BP: 124/80  Pulse: 73  Resp: 16  SpO2: 97%   Body mass index is 33.81 kg/m.  Physical Exam Vitals and nursing note reviewed.  Constitutional:      General: She is not in acute distress.    Appearance: She is well-developed.  HENT:     Head: Normocephalic and atraumatic.     Mouth/Throat:     Mouth: Oropharynx is clear and moist. Mucous membranes are dry.  Eyes:     Conjunctiva/sclera: Conjunctivae normal.  Cardiovascular:     Rate and Rhythm: Normal rate and regular rhythm.     Pulses:          Dorsalis pedis pulses are 2+ on the right side and 2+ on the left side.     Heart sounds: No murmur heard.   Pulmonary:     Effort: Pulmonary effort is normal. No respiratory distress.     Breath sounds: Normal breath sounds.  Abdominal:     Palpations: Abdomen is soft.     Tenderness: There is no abdominal tenderness.  Musculoskeletal:        General: No edema.  Lymphadenopathy:     Cervical: No cervical adenopathy.  Skin:    General: Skin is warm.     Findings: No erythema or rash.  Neurological:     Mental Status: She is alert and oriented to person, place, and time.     Cranial Nerves: No cranial nerve deficit.     Deep Tendon Reflexes: Strength normal.     Comments: Antalgic gait, assisted with a cane.  Psychiatric:        Mood and Affect: Mood and affect normal.      Comments: Well groomed, good eye contact.   ASSESSMENT AND PLAN:  Ms.Saraphina was seen today for follow-up.  Diagnoses and all orders for this visit:  Essential hypertension, benign BP adequately controlled. Continue Losartan 100 mg daily,Spironolactone 25 mg daily,and Metoprolol succinate 100 mg daily. Low salt diet. Continue monitoring BP regularly.  Chronic back pain Continue with Tramadol 50 mg daily prn and Tylenol 500 mg 3-4 times daily.  Hypokalemia  Improved but not resolved. It is mild,so no changes in KLOR dose. Caution with K+ containing foods. No changes in Spironolactone.   Return in about 4 months (around 08/04/2020) for HTN and pain.   Joram Venson G. [place], MD  [facility]. [place] office.   A few things to remember from today's visit:  If you need refills please call your pharmacy. Do not use My Chart to request refills or for acute issues that need immediate attention.   Keep appt with cardiologist. Caution with potassium rich food. Increase fluid intake. Continue monitoring blood pressure. Tramadol can be taken with Tylenol. We will re-check kidney test next visit. No changes in blood pressure medications.  Please be sure medication list is accurate. If a new problem present, please set up appointment sooner than planned today.

## 2020-04-12 ENCOUNTER — Encounter: Payer: Self-pay | Admitting: *Deleted

## 2020-04-12 ENCOUNTER — Ambulatory Visit (INDEPENDENT_AMBULATORY_CARE_PROVIDER_SITE_OTHER): Payer: Medicare Other

## 2020-04-12 ENCOUNTER — Ambulatory Visit (INDEPENDENT_AMBULATORY_CARE_PROVIDER_SITE_OTHER): Payer: Medicare Other | Admitting: Internal Medicine

## 2020-04-12 ENCOUNTER — Other Ambulatory Visit: Payer: Self-pay

## 2020-04-12 ENCOUNTER — Encounter: Payer: Self-pay | Admitting: Internal Medicine

## 2020-04-12 VITALS — BP 160/102 | HR 75 | Ht 64.0 in | Wt 198.0 lb

## 2020-04-12 DIAGNOSIS — R002 Palpitations: Secondary | ICD-10-CM

## 2020-04-12 LAB — BASIC METABOLIC PANEL
BUN/Creatinine Ratio: 15 (ref 12–28)
BUN: 18 mg/dL (ref 8–27)
CO2: 21 mmol/L (ref 20–29)
Calcium: 9.3 mg/dL (ref 8.7–10.3)
Chloride: 105 mmol/L (ref 96–106)
Creatinine, Ser: 1.17 mg/dL — ABNORMAL HIGH (ref 0.57–1.00)
GFR calc Af Amer: 56 mL/min/{1.73_m2} — ABNORMAL LOW (ref >59)
GFR calc non Af Amer: 48 mL/min/{1.73_m2} — ABNORMAL LOW (ref >59)
Glucose: 108 mg/dL — ABNORMAL HIGH (ref 65–99)
Potassium: 3.9 mmol/L (ref 3.5–5.2)
Sodium: 142 mmol/L (ref 134–144)

## 2020-04-12 NOTE — Patient Instructions (Signed)
Medication Instructions:  No changes *If you need a refill on your cardiac medications before your next appointment, please call your pharmacy*   Lab Work: Today: BMET  If you have labs (blood work) drawn today and your tests are completely normal, you will receive your results only by: Marland Kitchen MyChart Message (if you have MyChart) OR . A paper copy in the mail If you have any lab test that is abnormal or we need to change your treatment, we will call you to review the results.   Testing/Procedures: Meghan Nichols- Long Term Monitor Instructions   Your physician has requested you wear your ZIO patch monitor 14 days.   This is a single patch monitor.  [employer] supplies one patch monitor per enrollment.  Additional stickers are not available.   Please do not apply patch if you will be having a Nuclear Stress Test, Echocardiogram, Cardiac CT, MRI, or Chest Xray during the time frame you would be wearing the monitor. The patch cannot be worn during these tests.  You cannot remove and re-apply the ZIO XT patch monitor.   Your ZIO patch monitor will be sent USPS Priority mail from [facility] directly to your home address. The monitor may also be mailed to a PO BOX if home delivery is not available.   It may take 3-5 days to receive your monitor after you have been enrolled.   Once you have received you monitor, please review enclosed instructions.  Your monitor has already been registered assigning a specific monitor serial # to you.   Applying the monitor   Shave hair from upper left chest.   Hold abrader disc by orange tab.  Rub abrader in 40 strokes over left upper chest as indicated in your monitor instructions.   Clean area with 4 enclosed alcohol pads .  Use all pads to assure are is cleaned thoroughly.  Let dry.   Apply patch as indicated in monitor instructions.  Patch will be place under collarbone on left side of chest with arrow pointing upward.   Rub patch adhesive wings for 2  minutes.Remove white label marked "1".  Remove white label marked "2".  Rub patch adhesive wings for 2 additional minutes.   While looking in a mirror, press and release button in center of patch.  A small green light will flash 3-4 times .  This will be your only indicator the monitor has been turned on.     Do not shower for the first 24 hours.  You may shower after the first 24 hours.   Press button if you feel a symptom. You will hear a small click.  Record Date, Time and Symptom in the Patient Log Book.   When you are ready to remove patch, follow instructions on last 2 pages of Patient Log Book.  Stick patch monitor onto last page of Patient Log Book.   Place Patient Log Book in [place] box.  Use locking tab on box and tape box closed securely.  The [employer] and [employer] has [employer] on it.  Please place in mailbox as soon as possible.  Your physician should have your test results approximately 7 days after the monitor has been mailed back to [facility].   Call [facility] Customer Care at [phone] if you have questions regarding your ZIO XT patch monitor.  Call them immediately if you see an orange light blinking on your monitor.   If your monitor falls off in less than 4 days contact  our Monitor department at [phone].  If your monitor becomes loose or falls off after 4 days call [employer] at [phone] for suggestions on securing your monitor.    Follow-Up: Follow up with your physician will depend on test results.

## 2020-04-12 NOTE — Progress Notes (Signed)
Cardiology Office Note   Date:  04/12/2020   ID:  Meghan Nichols, DOB February 03, 1951, MRN [id number]  PCP:  [place], Betty G, MD  Cardiologist:   Dietrich Pates, MD    Patient referred for evaluation palpitations   History of Present Illness: Meghan Nichols is a 70 y.o. female with a history of hypertension.  She is followed by Dr. [place].  She was seen on February 4.  Noted about 5 days where she had a spinning sensation when she woke up.  Intermittent.  Worse with movement.  Physical exam noted a murmur and also the rhythm was irregular.  She was seen recently on February 16.   Patient said she notices palpitations least a few times a week usually when she is in bed at night.  She will feel her heart skip not real fast but irregular.  She will feel it in her ear.  She denies dizziness.  No chest pain.  Breathing is okay.    Current Meds  Medication Sig  . amLODipine (NORVASC) 10 MG tablet Take 10 mg by mouth daily.  . Ascorbic Acid (VITAMIN C) 100 MG tablet Take 100 mg by mouth daily.  Marland Kitchen aspirin 81 MG tablet Take 81 mg by mouth daily.  Marland Kitchen azelastine (ASTELIN) 0.1 % nasal spray Place 2 sprays into both nostrils 2 (two) times daily. Use in each nostril as directed  . cholecalciferol (VITAMIN D) 1000 UNITS tablet Take 1,000 Units by mouth daily.  . fexofenadine (ALLEGRA) 180 MG tablet TAKE 1 TABLET BY MOUTH EVERY DAY  . fluticasone (FLONASE) 50 MCG/ACT nasal spray Place 1 spray into both nostrils 2 (two) times daily.  Marland Kitchen losartan (COZAAR) 100 MG tablet Take 1 tablet (100 mg total) by mouth daily.  . meclizine (ANTIVERT) 25 MG tablet Take 1 tablet (25 mg total) by mouth 2 (two) times daily as needed for dizziness.  . metoprolol succinate (TOPROL-XL) 100 MG 24 hr tablet TAKE 1 TABLET EVERY DAY WITH A MEAL  . potassium chloride SA (KLOR-CON M20) 20 MEQ tablet Take 1 tablet (20 mEq total) by mouth 2 (two) times daily.  . pravastatin (PRAVACHOL) 20 MG tablet Take 1 tablet (20 mg total) by mouth  daily.  Marland Kitchen spironolactone (ALDACTONE) 25 MG tablet Take 1 tablet (25 mg total) by mouth daily.  . traMADol (ULTRAM) 50 MG tablet Take 1 tablet (50 mg total) by mouth daily as needed.  Marland Kitchen VITAMIN E PO Take 1 tablet by mouth daily.  Marland Kitchen VYZULTA 0.024 % SOLN Place 1 drop into both eyes daily.     Allergies:   Penicillins   Past Medical History:  Diagnosis Date  . High cholesterol   . Hypertension     Past Surgical History:  Procedure Laterality Date  . ABDOMINAL HYSTERECTOMY       Social History:  The patient  reports that she has never smoked. She has never used smokeless tobacco. She reports that she does not drink alcohol and does not use drugs.   Family History:  The patient's family history includes Diabetes in her sister and sister; Hypertension in her father, maternal grandfather, maternal grandmother, mother, and sister.    ROS:  Please see the history of present illness. All other systems are reviewed and  Negative to the above problem except as noted.    PHYSICAL EXAM: VS:  BP (!) 160/102   Pulse 75   Ht 5\' 4"  (1.626 m)   Wt 198 lb (89.8 kg)   SpO2  97%   BMI 33.99 kg/m   GEN: Well nourished, well developed, in no acute distress  HEENT: normal  Neck: no JVD, no carotid bruits\ Cardiac: RRR; no murmurs, no skips.  No lower extremity edema  Respiratory:  clear to auscultation bilaterally, normal work of breathing GI: soft, nontender, nondistended, + BS  No hepatomegaly  MS: no deformity Moving all extremities   Skin: warm and dry, no rash Neuro:  Strength and sensation are intact Psych: euthymic mood, full affect   EKG:  EKG is not ordered today.  Not on February 4 sinus rhythm.  LVH.   Lipid Panel    Component Value Date/Time   CHOL 214 (H) 02/02/2020 0848   TRIG 144 02/02/2020 0848   HDL 62 02/02/2020 0848   CHOLHDL 3.5 02/02/2020 0848   VLDL 30.0 08/07/2019 0800   LDLCALC 126 (H) 02/02/2020 0848   LDLDIRECT 160.0 08/25/2015 1416      Wt Readings  from Last 3 Encounters:  04/12/20 198 lb (89.8 kg)  04/06/20 197 lb (89.4 kg)  03/25/20 195 lb 2 oz (88.5 kg)      ASSESSMENT AND PLAN:  1.  Palpitations REl short lived   Do not sound hemodynamically destabilizing   WOuld like to document what they are     WIll set up for Zio patch    2 hypertension  BP is elevated   This weill need to be followed.    3 dyslipidemia.  Last lipids:   LDL 126   HDL 62   Trig 144  Follow-up based on test results.   Current medicines are reviewed at length with the patient today.  The patient does not have concerns regarding medicines.  Signed, Dietrich Pates, MD  04/12/2020 9:23 AM    [facility] [address] [place], [place], [place]  [address] Phone: [phone]; Fax: [phone]

## 2020-04-12 NOTE — Progress Notes (Signed)
Patient ID: Meghan Nichols, female   DOB: 06-Jun-1950, 70 y.o.   MRN: [id number] Patient enrolled for [employer] to ship a 14 day ZIO XT long term holter monitor to her home.

## 2020-04-14 DIAGNOSIS — R002 Palpitations: Secondary | ICD-10-CM | POA: Diagnosis not present

## 2020-04-21 ENCOUNTER — Other Ambulatory Visit: Payer: Self-pay | Admitting: Family Medicine

## 2020-04-22 ENCOUNTER — Telehealth: Payer: Self-pay | Admitting: *Deleted

## 2020-04-22 DIAGNOSIS — I10 Essential (primary) hypertension: Secondary | ICD-10-CM

## 2020-04-22 MED ORDER — LOSARTAN POTASSIUM 50 MG PO TABS: 50.0000 mg | ORAL_TABLET | Freq: Every day | ORAL | 3 refills | Status: DC

## 2020-04-22 NOTE — Telephone Encounter (Signed)
Reviewed with pt.  She voices understanding and agreement.  Lab appointment made and med list updated.

## 2020-04-22 NOTE — Telephone Encounter (Signed)
-----   Message from Pricilla Riffle, MD sent at 04/18/2020  9:50 AM EST ----- Kidney function   Cr is minimally improved from 3 wks ago   Now 1.17 Stay off of meloxicam Would cut back on losartan to 50 mg daily Follow BP   Stay hydrated  Check BMET in 2 wks WOuld like to keep on aldactone at current dose for now with K being low in past

## 2020-04-25 DIAGNOSIS — H2513 Age-related nuclear cataract, bilateral: Secondary | ICD-10-CM | POA: Diagnosis not present

## 2020-04-25 DIAGNOSIS — H25812 Combined forms of age-related cataract, left eye: Secondary | ICD-10-CM | POA: Diagnosis not present

## 2020-04-27 DIAGNOSIS — H2513 Age-related nuclear cataract, bilateral: Secondary | ICD-10-CM | POA: Diagnosis not present

## 2020-05-02 DIAGNOSIS — R002 Palpitations: Secondary | ICD-10-CM | POA: Diagnosis not present

## 2020-05-06 ENCOUNTER — Other Ambulatory Visit: Payer: Medicare Other

## 2020-05-09 ENCOUNTER — Other Ambulatory Visit: Payer: Self-pay

## 2020-05-09 ENCOUNTER — Other Ambulatory Visit: Payer: Medicare Other | Admitting: *Deleted

## 2020-05-09 DIAGNOSIS — I10 Essential (primary) hypertension: Secondary | ICD-10-CM | POA: Diagnosis not present

## 2020-05-09 LAB — BASIC METABOLIC PANEL
BUN/Creatinine Ratio: 15 (ref 12–28)
BUN: 18 mg/dL (ref 8–27)
CO2: 22 mmol/L (ref 20–29)
Calcium: 9.8 mg/dL (ref 8.7–10.3)
Chloride: 107 mmol/L — ABNORMAL HIGH (ref 96–106)
Creatinine, Ser: 1.21 mg/dL — ABNORMAL HIGH (ref 0.57–1.00)
Glucose: 96 mg/dL (ref 65–99)
Potassium: 3.9 mmol/L (ref 3.5–5.2)
Sodium: 146 mmol/L — ABNORMAL HIGH (ref 134–144)
eGFR: 49 mL/min/{1.73_m2} — ABNORMAL LOW (ref >59)

## 2020-05-11 ENCOUNTER — Telehealth: Payer: Self-pay | Admitting: Internal Medicine

## 2020-05-11 NOTE — Telephone Encounter (Signed)
Informed patient of results and verbal understanding expressed.  

## 2020-05-11 NOTE — Telephone Encounter (Signed)
Patient returning call for lab results. 

## 2020-06-05 ENCOUNTER — Other Ambulatory Visit: Payer: Self-pay | Admitting: Family Medicine

## 2020-06-05 DIAGNOSIS — I10 Essential (primary) hypertension: Secondary | ICD-10-CM

## 2020-08-05 ENCOUNTER — Other Ambulatory Visit: Payer: Self-pay

## 2020-08-05 ENCOUNTER — Ambulatory Visit (INDEPENDENT_AMBULATORY_CARE_PROVIDER_SITE_OTHER): Payer: Medicare Other | Admitting: Family Medicine

## 2020-08-05 ENCOUNTER — Encounter: Payer: Self-pay | Admitting: Family Medicine

## 2020-08-05 VITALS — BP 178/100 | HR 83 | Temp 97.8°F | Resp 16 | Ht 64.0 in | Wt 197.8 lb

## 2020-08-05 DIAGNOSIS — R600 Localized edema: Secondary | ICD-10-CM | POA: Diagnosis not present

## 2020-08-05 DIAGNOSIS — M5441 Lumbago with sciatica, right side: Secondary | ICD-10-CM

## 2020-08-05 DIAGNOSIS — E876 Hypokalemia: Secondary | ICD-10-CM

## 2020-08-05 DIAGNOSIS — R7303 Prediabetes: Secondary | ICD-10-CM

## 2020-08-05 DIAGNOSIS — G8929 Other chronic pain: Secondary | ICD-10-CM | POA: Diagnosis not present

## 2020-08-05 DIAGNOSIS — M5442 Lumbago with sciatica, left side: Secondary | ICD-10-CM | POA: Diagnosis not present

## 2020-08-05 DIAGNOSIS — N1831 Chronic kidney disease, stage 3a: Secondary | ICD-10-CM | POA: Diagnosis not present

## 2020-08-05 DIAGNOSIS — I10 Essential (primary) hypertension: Secondary | ICD-10-CM

## 2020-08-05 LAB — BASIC METABOLIC PANEL
BUN: 11 mg/dL (ref 6–23)
CO2: 27 mEq/L (ref 19–32)
Calcium: 9.4 mg/dL (ref 8.4–10.5)
Chloride: 105 mEq/L (ref 96–112)
Creatinine, Ser: 0.93 mg/dL (ref 0.40–1.20)
GFR: 63.34 mL/min (ref >60.00)
Glucose, Bld: 83 mg/dL (ref 70–99)
Potassium: 3.2 mEq/L — ABNORMAL LOW (ref 3.5–5.1)
Sodium: 143 mEq/L (ref 135–145)

## 2020-08-05 LAB — MICROALBUMIN / CREATININE URINE RATIO
Creatinine,U: 150.8 mg/dL
Microalb Creat Ratio: 25.4 mg/g (ref 0.0–30.0)
Microalb, Ur: 38.3 mg/dL — ABNORMAL HIGH (ref 0.0–1.9)

## 2020-08-05 LAB — VITAMIN D 25 HYDROXY (VIT D DEFICIENCY, FRACTURES): VITD: 48.96 ng/mL (ref 30.00–100.00)

## 2020-08-05 LAB — HEMOGLOBIN A1C: Hgb A1c MFr Bld: 6.1 % (ref 4.6–6.5)

## 2020-08-05 NOTE — Assessment & Plan Note (Signed)
We discussed Dx,prognosis,and treatment. reviewed renal functions for the past 6 months. Continue avoiding NSAID's. Low salt diet. We discussed the importance of having BP adequately controlled. Continue Losartan 50 mg daily.

## 2020-08-05 NOTE — Assessment & Plan Note (Signed)
We discussed possible etiologies. Problem can be aggravated by medications like Amlodipine. Compression stockings and LE elevation a few times per day will help. Because hypoK+, HCTZ or furosemide are not options. Will consider resuming Spironolactone,deprending of lab results.

## 2020-08-05 NOTE — Progress Notes (Signed)
HPI: Meghan Nichols is a 70 y.o. female, who is here today for 4 months follow up.   She was last seen on 04/06/20. Since her last visit she has seen cardiologist, Dr Tenny Craw on 04/12/20. She has not noted palpitations or CP. Hear monitor negative for significant arrhythmia.  She has not taken Amlodipine 10 mg for a week because LE edema, when she stopped medication edema improved. She was taking medication in AM. She is also on Losartan, which was decreased from 100 mg to 50 mg, according to pt it was decreased because concerns about K+. She is not longer on Spironolactone. She is reporting BP 138/72. Negative for severe/frequent headache, visual changes, chest pain, dyspnea, palpitation,or local weakness.  HypoK+: She is on KLOR 20 meq bid.  Lab Results  Component Value Date   CREATININE 1.21 (H) 05/09/2020   BUN 18 05/09/2020   NA 146 (H) 05/09/2020   K 3.9 05/09/2020   CL 107 (H) 05/09/2020   CO2 22 05/09/2020   Aldosterone, Serum  ng/dL 6   Comment: .   Unable to flag abnormal result(s), please refer      to reference range(s) below:  .  Adult Reference Ranges for Aldosterone, LC/MS/MS:      Upright  8:00 - 10:00 am    < or = 28 ng/dL      Upright  0:98 -  1:19 pm    < or = 21 ng/dL      Supine   1:47 - 82:95 am       3 - 16 ng/dL  .   Renin Activity 0.25 - 5.82 ng/mL/h 0.06 Low    ALDO / PRA Ratio 0.9 - 28.9 Ratio 100.0 High     Reviewing past imagine, abdominal CT in 05/2014 showed 2.2 x 1.6 left adrenal mass She is concerned about her renal function. It has been mildly abnormal for the past few months. 21/5/22 e GFR 57, 03/25/20 e GFR 45, and /22/22 Cr 1.17 and e GFR 56.  Chronic back pain: Lower back pain radiated to both extremities, occasionally numbness on LE's. Pain can be 8/10. Exacerbated by prolonged walking/standing. Alleviated by rest.  Also intermittent upper back pain. Meloxicam was discontinued. She takes Tramadol very occasional for upper and  loser back pain. She also takes Tylenol.  Prediabetes: Negative for polydipsia,polyuria, or polyphagia. HgA1C 5.9 on 02/02/20.  Review of Systems  Constitutional:  Negative for activity change, appetite change, fatigue and fever.  HENT:  Negative for mouth sores, nosebleeds and trouble swallowing.   Respiratory:  Negative for cough and wheezing.   Cardiovascular:  Negative for palpitations.  Gastrointestinal:  Negative for abdominal pain, nausea and vomiting.       Negative for changes in bowel habits.  Genitourinary:  Negative for decreased urine volume and hematuria.  Neurological:  Negative for syncope, facial asymmetry and weakness.  Psychiatric/Behavioral:  Negative for confusion.   Rest of ROS, see pertinent positives sand negatives in HPI  Current Outpatient Medications on File Prior to Visit  Medication Sig Dispense Refill   amLODipine (NORVASC) 10 MG tablet TAKE 1 TABLET BY MOUTH EVERY DAY 90 tablet 2   Ascorbic Acid (VITAMIN C) 100 MG tablet Take 100 mg by mouth daily.     aspirin 81 MG tablet Take 81 mg by mouth daily.     azelastine (ASTELIN) 0.1 % nasal spray Place 2 sprays into both nostrils 2 (two) times daily. Use in each nostril as directed 30  mL 3   cholecalciferol (VITAMIN D) 1000 UNITS tablet Take 1,000 Units by mouth daily.     fexofenadine (ALLEGRA) 180 MG tablet TAKE 1 TABLET BY MOUTH EVERY DAY 90 tablet 3   fluticasone (FLONASE) 50 MCG/ACT nasal spray Place 1 spray into both nostrils 2 (two) times daily. 16 g 3   losartan (COZAAR) 50 MG tablet Take 1 tablet (50 mg total) by mouth daily. 90 tablet 3   metoprolol succinate (TOPROL-XL) 100 MG 24 hr tablet TAKE 1 TABLET EVERY DAY WITH A MEAL 90 tablet 2   potassium chloride SA (KLOR-CON M20) 20 MEQ tablet Take 1 tablet (20 mEq total) by mouth 2 (two) times daily. 180 tablet 2   pravastatin (PRAVACHOL) 20 MG tablet Take 1 tablet (20 mg total) by mouth daily. 90 tablet 3   traMADol (ULTRAM) 50 MG tablet Take 1 tablet  (50 mg total) by mouth daily as needed. 30 tablet 0   VITAMIN E PO Take 1 tablet by mouth daily.     VYZULTA 0.024 % SOLN Place 1 drop into both eyes daily.     No current facility-administered medications on file prior to visit.    Past Medical History:  Diagnosis Date   High cholesterol    Hypertension    Allergies  Allergen Reactions   Penicillins Hives    Has patient had a PCN reaction causing immediate rash, facial/tongue/throat swelling, SOB or lightheadedness with hypotension: Yes Has patient had a PCN reaction causing severe rash involving mucus membranes or skin necrosis: Unknown Has patient had a PCN reaction that required hospitalization: No Has patient had a PCN reaction occurring within the last 10 years: No If all of the above answers are "NO", then may proceed with Cephalosporin use.     Social History   Socioeconomic History   Marital status: Married    Spouse name: Not on file   Number of children: Not on file   Years of education: Not on file   Highest education level: Not on file  Occupational History   Not on file  Tobacco Use   Smoking status: Never   Smokeless tobacco: Never  Substance and Sexual Activity   Alcohol use: No   Drug use: No   Sexual activity: Never  Other Topics Concern   Not on file  Social History Narrative   Not on file   Social Determinants of Health   Financial Resource Strain: Not on file  Food Insecurity: Not on file  Transportation Needs: Not on file  Physical Activity: Not on file  Stress: Not on file  Social Connections: Not on file    Vitals:   08/05/20 0840  BP: (!) 178/100  Pulse: 83  Resp: 16  Temp: 97.8 F (36.6 C)  SpO2: 97%   Body mass index is 33.95 kg/m.  Physical Exam Vitals and nursing note reviewed.  Constitutional:      General: She is not in acute distress.    Appearance: She is well-developed.  HENT:     Head: Normocephalic and atraumatic.     Mouth/Throat:     Mouth: Mucous  membranes are moist.     Pharynx: Oropharynx is clear.  Eyes:     Conjunctiva/sclera: Conjunctivae normal.  Cardiovascular:     Rate and Rhythm: Normal rate. Rhythm irregular. Occasional Extrasystoles are present.    Heart sounds: No murmur heard.    Comments: DP pulses present. Pulmonary:     Effort: Pulmonary effort is normal. No  respiratory distress.     Breath sounds: Normal breath sounds.  Abdominal:     Palpations: Abdomen is soft.     Tenderness: There is no abdominal tenderness.  Musculoskeletal:     Right lower leg: 1+ Pitting Edema present.     Left lower leg: 1+ Pitting Edema present.  Lymphadenopathy:     Cervical: No cervical adenopathy.  Skin:    General: Skin is warm.     Findings: No erythema or rash.  Neurological:     General: No focal deficit present.     Mental Status: She is alert and oriented to person, place, and time.     Cranial Nerves: No cranial nerve deficit.     Comments: Antalgic gait assisted with a cane.  Psychiatric:     Comments: Well groomed, good eye contact.   ASSESSMENT AND PLAN:  Meghan Nichols was seen today for 4 months follow-up.  Orders Placed This Encounter  Procedures   Basic metabolic panel   VITAMIN D 25 Hydroxy (Vit-D Deficiency, Fractures)   Microalbumin / creatinine urine ratio   Hemoglobin A1c   Aldosterone + renin activity w/ ratio   Lab Results  Component Value Date   CREATININE 0.93 08/05/2020   BUN 11 08/05/2020   NA 143 08/05/2020   K 3.2 (L) 08/05/2020   CL 105 08/05/2020   CO2 27 08/05/2020   Lab Results  Component Value Date   MICROALBUR 38.3 (H) 08/05/2020   Lab Results  Component Value Date   HGBA1C 6.1 08/05/2020   Essential hypertension, benign Re-checked 160/80. BP is not adequately controlled. She agrees with resuming Amlodipine but 5 mg, she will take at bedtime.  We discussed some side effects. Continue losartan 50 mg daily and metoprolol succinate 100 mg daily. We discussed some  possible complications of elevated BP.   Hypokalemia Last couple K+ numbers in normal range. We discussed possible etiologies, primary hypoaldosteronisms to be considered. We need to repeat aldosterone and plasma renin. Continue KLOR 20 meq bid. We discussed some side effects of Losartan and spironolactone (took in the past). Further recommendations according to lab results.  Chronic bilateral low back pain with bilateral sciatica Problem is stable. Continue tramadol 50 mg daily as needed and Tylenol 500 mg 3-4 times per day.  Prediabetes HgA1C 5.9 in 01/2020. Healthy life style for primary prevention. Further recommendations according to HgA1C result.  Stage 3a chronic kidney disease (HCC) We discussed Dx,prognosis,and treatment. reviewed renal functions for the past 6 months. Continue avoiding NSAID's. Low salt diet. We discussed the importance of having BP adequately controlled. Continue Losartan 50 mg daily.  Bilateral lower extremity edema We discussed possible etiologies. Problem can be aggravated by medications like Amlodipine. Compression stockings and LE elevation a few times per day will help. Because hypoK+, HCTZ or furosemide are not options. Will consider resuming Spironolactone,deprending of lab results.  Spent 44 minutes.  During this time history was obtained and documented, examination was performed, prior labs/imagine reviewed, and assessment/plan discussed. Tried to ordered adrenal CT but Dx of adrenal mass 1-4 cm did not qualify. So will wait until plasma renin and aldosterone are repeated.  Return in about 3 months (around 11/05/2020).   Calena Salem G. [place], MD  [facility]. [place] office.   A few things to remember from today's visit:   Essential hypertension, benign - Plan: Basic metabolic panel  Hypokalemia  IFG (impaired fasting glucose) - Plan: Hemoglobin A1c  Stage 3a chronic kidney disease (HCC) -  Plan: VITAMIN D 25 Hydroxy  (Vit-D Deficiency, Fractures), Microalbumin / creatinine urine ratio  Bilateral lower extremity edema  If you need refills please call your pharmacy. Do not use My Chart to request refills or for acute issues that need immediate attention.   Please resume Norvasc 5 mg but at bedtime. No changes in losartan or Metoprolol. Continue potassium. We will continue monitor kidney function.  Please be sure medication list is accurate. If a new problem present, please set up appointment sooner than planned today.

## 2020-08-05 NOTE — Assessment & Plan Note (Signed)
HgA1C 5.9 in 01/2020. Healthy life style for primary prevention. Further recommendations according to HgA1C result.

## 2020-08-05 NOTE — Assessment & Plan Note (Signed)
Problem is stable. Continue tramadol 50 mg daily as needed and Tylenol 500 mg 3-4 times per day.

## 2020-08-05 NOTE — Assessment & Plan Note (Addendum)
Last couple K+ numbers in normal range. We discussed possible etiologies, primary hypoaldosteronisms to be considered. We need to repeat aldosterone and plasma renin. Continue KLOR 20 meq bid. We discussed some side effects of Losartan and spironolactone (took in the past). Further recommendations according to lab results.

## 2020-08-05 NOTE — Assessment & Plan Note (Addendum)
Re-checked 160/80. BP is not adequately controlled. She agrees with resuming Amlodipine but 5 mg, she will take at bedtime.  We discussed some side effects. Continue losartan 50 mg daily and metoprolol succinate 100 mg daily. We discussed some possible complications of elevated BP.

## 2020-08-05 NOTE — Patient Instructions (Addendum)
A few things to remember from today's visit:   Essential hypertension, benign - Plan: Basic metabolic panel  Hypokalemia  IFG (impaired fasting glucose) - Plan: Hemoglobin A1c  Stage 3a chronic kidney disease (HCC) - Plan: VITAMIN D 25 Hydroxy (Vit-D Deficiency, Fractures), Microalbumin / creatinine urine ratio  Bilateral lower extremity edema  If you need refills please call your pharmacy. Do not use My Chart to request refills or for acute issues that need immediate attention.   Please resume Norvasc 5 mg but at bedtime. No changes in losartan or Metoprolol. Continue potassium. We will continue monitor kidney function.  Please be sure medication list is accurate. If a new problem present, please set up appointment sooner than planned today.

## 2020-09-27 DIAGNOSIS — H2511 Age-related nuclear cataract, right eye: Secondary | ICD-10-CM | POA: Diagnosis not present

## 2020-09-27 DIAGNOSIS — H401132 Primary open-angle glaucoma, bilateral, moderate stage: Secondary | ICD-10-CM | POA: Diagnosis not present

## 2020-10-14 ENCOUNTER — Other Ambulatory Visit: Payer: Self-pay | Admitting: Family Medicine

## 2020-10-14 DIAGNOSIS — E876 Hypokalemia: Secondary | ICD-10-CM

## 2020-11-08 DIAGNOSIS — H401132 Primary open-angle glaucoma, bilateral, moderate stage: Secondary | ICD-10-CM | POA: Diagnosis not present

## 2020-11-08 DIAGNOSIS — H2511 Age-related nuclear cataract, right eye: Secondary | ICD-10-CM | POA: Diagnosis not present

## 2020-11-09 ENCOUNTER — Encounter: Payer: Self-pay | Admitting: Family Medicine

## 2020-11-09 ENCOUNTER — Ambulatory Visit (INDEPENDENT_AMBULATORY_CARE_PROVIDER_SITE_OTHER): Payer: Medicare Other | Admitting: Family Medicine

## 2020-11-09 ENCOUNTER — Other Ambulatory Visit: Payer: Self-pay

## 2020-11-09 VITALS — BP 140/80 | HR 85 | Resp 16 | Ht 64.0 in | Wt 194.1 lb

## 2020-11-09 DIAGNOSIS — N1831 Chronic kidney disease, stage 3a: Secondary | ICD-10-CM | POA: Diagnosis not present

## 2020-11-09 DIAGNOSIS — Z6833 Body mass index (BMI) 33.0-33.9, adult: Secondary | ICD-10-CM

## 2020-11-09 DIAGNOSIS — E782 Mixed hyperlipidemia: Secondary | ICD-10-CM

## 2020-11-09 DIAGNOSIS — M5441 Lumbago with sciatica, right side: Secondary | ICD-10-CM

## 2020-11-09 DIAGNOSIS — R7303 Prediabetes: Secondary | ICD-10-CM

## 2020-11-09 DIAGNOSIS — I10 Essential (primary) hypertension: Secondary | ICD-10-CM

## 2020-11-09 DIAGNOSIS — G8929 Other chronic pain: Secondary | ICD-10-CM | POA: Diagnosis not present

## 2020-11-09 DIAGNOSIS — E876 Hypokalemia: Secondary | ICD-10-CM

## 2020-11-09 DIAGNOSIS — E669 Obesity, unspecified: Secondary | ICD-10-CM

## 2020-11-09 DIAGNOSIS — M5442 Lumbago with sciatica, left side: Secondary | ICD-10-CM

## 2020-11-09 MED ORDER — POTASSIUM CHLORIDE CRYS ER 20 MEQ PO TBCR: 20.0000 meq | EXTENDED_RELEASE_TABLET | Freq: Every day | ORAL | 0 refills | Status: DC

## 2020-11-09 MED ORDER — SPIRONOLACTONE 25 MG PO TABS: 25.0000 mg | ORAL_TABLET | Freq: Every day | ORAL | 2 refills | Status: DC

## 2020-11-09 MED ORDER — METOPROLOL SUCCINATE ER 100 MG PO TB24: ORAL_TABLET | ORAL | 2 refills | Status: DC

## 2020-11-09 NOTE — Assessment & Plan Note (Signed)
She has lost a couple pounds since her last visit. Encouraged consistency with low impact physical activity and with following a healthful diet.

## 2020-11-09 NOTE — Progress Notes (Signed)
HPI:  [name] is a 70 y.o. female, who is here today for 3 months follow up.   She was last seen on 08/05/2020. Hypertension:  Medications: Losartan 50 mg daily and metoprolol succinate 100 mg daily.  She discontinued amlodipine because it was aggravating lower extremity edema. BP readings at home: 130-1 40/80 Side effects: None.  Negative for unusual or severe headache, visual changes, exertional chest pain, dyspnea,  focal weakness. Lower extremity edema is milder, worse at the end of the day. Negative for erythema. Hypertensive retinopathy, last appointment with her ophthalmologist in 09/2020, new prescription for eyedrops started.  Hypokalemia: Currently she is on K-Lor 20 mEq twice daily. In 12/2016 aldosterone was 6, renin activity 0.06, and aldosterone/pra ratio 100. It was supposed to be repeated,lab expired.  Lab Results  Component Value Date   CREATININE 0.93 08/05/2020   BUN 11 08/05/2020   NA 143 08/05/2020   K 3.2 (L) 08/05/2020   CL 105 08/05/2020   CO2 27 08/05/2020   Prediabetes: Negative for polyuria, polydipsia, and polyphagia. Lab Results  Component Value Date   HGBA1C 6.1 08/05/2020   She states that received a call from nurse recommending Metformin, she declined.  Chronic lower back pain: She has not taken tramadol since 01/2020. Pain is sometimes radiated to the lower extremities,L>RR. Negative for saddle anesthesia or bowel/bladder dysfunction.  She is taking Tylenol 500 mg 2 tablets at bedtime and this has helped. Pain is not interfering with ADLs at this time.  CKD 3: She has not noted gross hematuria, foam in urine, or decreased urine output.  Lab Results  Component Value Date   MICROALBUR 38.3 (H) 08/05/2020   Hyperlipidemia: Currently on pravastatin 20 mg daily. Following a low fat diet: Yes. Side effects from medication: None Lab Results  Component Value Date   CHOL 214 (H) 02/02/2020   HDL 62 02/02/2020   LDLCALC  126 (H) 02/02/2020   LDLDIRECT 160.0 08/25/2015   TRIG 144 02/02/2020   CHOLHDL 3.5 02/02/2020   Review of Systems  Constitutional:  Negative for activity change, appetite change, fatigue and fever.  HENT:  Negative for mouth sores, nosebleeds and sore throat.   Respiratory:  Negative for cough and wheezing.   Gastrointestinal:  Negative for abdominal pain, nausea and vomiting.       Negative for changes in bowel habits.  Musculoskeletal:  Positive for arthralgias, back pain and gait problem.  Neurological:  Negative for syncope, facial asymmetry and weakness.  Psychiatric/Behavioral:  Negative for confusion. The patient is not nervous/anxious.   Rest of ROS, see pertinent positives sand negatives in HPI  Current Outpatient Medications on File Prior to Visit  Medication Sig Dispense Refill   Ascorbic Acid (VITAMIN C) 100 MG tablet Take 100 mg by mouth daily.     aspirin 81 MG tablet Take 81 mg by mouth daily.     azelastine (ASTELIN) 0.1 % nasal spray Place 2 sprays into both nostrils 2 (two) times daily. Use in each nostril as directed 30 mL 3   cholecalciferol (VITAMIN D) 1000 UNITS tablet Take 1,000 Units by mouth daily.     fexofenadine (ALLEGRA) 180 MG tablet TAKE 1 TABLET BY MOUTH EVERY DAY 90 tablet 3   fluticasone (FLONASE) 50 MCG/ACT nasal spray Place 1 spray into both nostrils 2 (two) times daily. 16 g 3   losartan (COZAAR) 50 MG tablet Take 1 tablet (50 mg total) by mouth daily. 90 tablet 3  metoprolol succinate (TOPROL-XL) 100 MG 24 hr tablet TAKE 1 TABLET EVERY DAY WITH A MEAL 90 tablet 2   pravastatin (PRAVACHOL) 20 MG tablet Take 1 tablet (20 mg total) by mouth daily. 90 tablet 3   traMADol (ULTRAM) 50 MG tablet Take 1 tablet (50 mg total) by mouth daily as needed. 30 tablet 0   VITAMIN E PO Take 1 tablet by mouth daily.     VYZULTA 0.024 % SOLN Place 1 drop into both eyes daily.     No current facility-administered medications on file prior to visit.   Past Medical  History:  Diagnosis Date   High cholesterol    Hypertension    Allergies  Allergen Reactions   Penicillins Hives    Has patient had a PCN reaction causing immediate rash, facial/tongue/throat swelling, SOB or lightheadedness with hypotension: Yes Has patient had a PCN reaction causing severe rash involving mucus membranes or skin necrosis: Unknown Has patient had a PCN reaction that required hospitalization: No Has patient had a PCN reaction occurring within the last 10 years: No If all of the above answers are "NO", then may proceed with Cephalosporin use.     Social History   Socioeconomic History   Marital status: Married    Spouse name: Not on file   Number of children: Not on file   Years of education: Not on file   Highest education level: Not on file  Occupational History   Not on file  Tobacco Use   Smoking status: Never   Smokeless tobacco: Never  Substance and Sexual Activity   Alcohol use: No   Drug use: No   Sexual activity: Never  Other Topics Concern   Not on file  Social History Narrative   Not on file   Social Determinants of Health   Financial Resource Strain: Not on file  Food Insecurity: Not on file  Transportation Needs: Not on file  Physical Activity: Not on file  Stress: Not on file  Social Connections: Not on file   Vitals:   11/09/20 0833  BP: 140/80  Pulse: 85  Resp: 16  SpO2: 99%   Wt Readings from Last 3 Encounters:  11/09/20 194 lb 2 oz (88.1 kg)  08/05/20 197 lb 12.8 oz (89.7 kg)  04/12/20 198 lb (89.8 kg)   Body mass index is 33.32 kg/m.  Physical Exam Vitals and nursing note reviewed.  Constitutional:      General: She is not in acute distress.    Appearance: She is well-developed.  HENT:     Head: Normocephalic and atraumatic.     Mouth/Throat:     Mouth: Mucous membranes are moist.     Pharynx: Oropharynx is clear.  Eyes:     Conjunctiva/sclera: Conjunctivae normal.  Cardiovascular:     Rate and Rhythm: Normal  rate and regular rhythm.     Pulses:          Dorsalis pedis pulses are 2+ on the right side and 2+ on the left side.     Heart sounds: No murmur heard.    Comments: Trace pitting LE edema,bilateral. Pulmonary:     Effort: Pulmonary effort is normal. No respiratory distress.     Breath sounds: Normal breath sounds.  Abdominal:     Palpations: Abdomen is soft. There is no hepatomegaly or mass.     Tenderness: There is no abdominal tenderness.  Musculoskeletal:     Thoracic back: No tenderness or bony tenderness.  Lumbar back: No tenderness or bony tenderness.     Comments: Antalgic gait. Left knee limitation of extension,mild.  Lymphadenopathy:     Cervical: No cervical adenopathy.  Skin:    General: Skin is warm.     Findings: No erythema or rash.  Neurological:     General: No focal deficit present.     Mental Status: She is alert and oriented to person, place, and time.     Cranial Nerves: No cranial nerve deficit.     Comments: Gait assisted with a cane.  Psychiatric:     Comments: Well groomed, good eye contact.   ASSESSMENT AND PLAN:  [name] was seen today for 3 months follow-up.  Orders Placed This Encounter  Procedures   Microalbumin / creatinine urine ratio   Hemoglobin A1c   Lipid panel   Comprehensive metabolic panel   Stage 3a chronic kidney disease (HCC) Continue losartan 50 mg daily. Adequate BP control and diabetes prevention. Continue adequate hydration, low-salt diet, and avoidance of NSAIDs.  Essential hypertension, benign BP has improved, SBP still slightly elevated. Continue losartan 50 mg daily and metoprolol succinate 100 mg daily. Spironolactone 25 mg added today. We discussed some side effects of medications. Continue monitoring BP at home. Continue low-salt diet. Eye exam is current.  Prediabetes We discussed the importance of a healthy lifestyle for diabetes prevention. We reviewed current some recommendations for  prediabetes treatment, which include pharmacologic treatment with metformin.  She is not interested in adding medications and I think it is appropriate. We will continue monitoring.  Hypokalemia Problem has been persistent despite of K-Lor supplementation. Today we added spironolactone 25 mg. We discussed side effects of medication and the risk of interaction with some of her other medications. We decreased K-Lor from 20 mEq twice daily to 20 mEq daily. For now I am holding on aldosterone/renin studies as far as BP is adequately controlled and K+ remains stable. BMP in 3 to 4 weeks. Clearly instructed about warning signs.  Chronic bilateral low back pain with bilateral sciatica Stable. Continue Tylenol 500 mg 2 tablets at bedtime. She will let me know if she needs a refill for tramadol 50 mg.  We discussed some side effects. Fall precautions and low impact exercise.  Hyperlipemia, mixed Continue pravastatin 20 mg daily and low-fat diet. We will arrange appointment for fasting labs.  Class 1 obesity with body mass index (BMI) of 33.0 to 33.9 in adult She has lost a couple pounds since her last visit. Encouraged consistency with low impact physical activity and with following a healthful diet.   I spent a total of 41 minutes in both face to face and non face to face activities for this visit on the date of this encounter. During this time history was obtained and documented, examination was performed, prior labs reviewed, and assessment/plan discussed.  Return in about 4 months (around 03/11/2021) for Fasting labs in 3-4 weeks..   Alyssa Rotondo G. [place], MD  [facility]. [place] office.

## 2020-11-09 NOTE — Assessment & Plan Note (Signed)
Continue losartan 50 mg daily. Adequate BP control and diabetes prevention. Continue adequate hydration, low-salt diet, and avoidance of NSAIDs.

## 2020-11-09 NOTE — Assessment & Plan Note (Signed)
Stable. Continue Tylenol 500 mg 2 tablets at bedtime. She will let me know if she needs a refill for tramadol 50 mg.  We discussed some side effects. Fall precautions and low impact exercise.

## 2020-11-09 NOTE — Progress Notes (Deleted)
HPI:  [name] is a 70 y.o. female, who is here today for 3 months follow up.   She was last seen on 08/05/20.  {4+ HPI elements (or status of 3 or more chronic diseases)} ***   Review of Systems Rest of ROS, see pertinent positives sand negatives in HPI [review of 2 to 9 systems] ***  Current Outpatient Medications on File Prior to Visit  Medication Sig Dispense Refill   amLODipine (NORVASC) 10 MG tablet TAKE 1 TABLET BY MOUTH EVERY DAY 90 tablet 2   Ascorbic Acid (VITAMIN C) 100 MG tablet Take 100 mg by mouth daily.     aspirin 81 MG tablet Take 81 mg by mouth daily.     azelastine (ASTELIN) 0.1 % nasal spray Place 2 sprays into both nostrils 2 (two) times daily. Use in each nostril as directed 30 mL 3   cholecalciferol (VITAMIN D) 1000 UNITS tablet Take 1,000 Units by mouth daily.     fexofenadine (ALLEGRA) 180 MG tablet TAKE 1 TABLET BY MOUTH EVERY DAY 90 tablet 3   fluticasone (FLONASE) 50 MCG/ACT nasal spray Place 1 spray into both nostrils 2 (two) times daily. 16 g 3   KLOR-CON M20 20 MEQ tablet TAKE 1 TABLET BY MOUTH TWICE A DAY 180 tablet 2   losartan (COZAAR) 50 MG tablet Take 1 tablet (50 mg total) by mouth daily. 90 tablet 3   metoprolol succinate (TOPROL-XL) 100 MG 24 hr tablet TAKE 1 TABLET EVERY DAY WITH A MEAL 90 tablet 2   pravastatin (PRAVACHOL) 20 MG tablet Take 1 tablet (20 mg total) by mouth daily. 90 tablet 3   traMADol (ULTRAM) 50 MG tablet Take 1 tablet (50 mg total) by mouth daily as needed. 30 tablet 0   VITAMIN E PO Take 1 tablet by mouth daily.     VYZULTA 0.024 % SOLN Place 1 drop into both eyes daily.     No current facility-administered medications on file prior to visit.     Past Medical History:  Diagnosis Date   High cholesterol    Hypertension    Allergies  Allergen Reactions   Penicillins Hives    Has patient had a PCN reaction causing immediate rash, facial/tongue/throat swelling, SOB or lightheadedness with hypotension:  Yes Has patient had a PCN reaction causing severe rash involving mucus membranes or skin necrosis: Unknown Has patient had a PCN reaction that required hospitalization: No Has patient had a PCN reaction occurring within the last 10 years: No If all of the above answers are "NO", then may proceed with Cephalosporin use.     Social History   Socioeconomic History   Marital status: Married    Spouse name: Not on file   Number of children: Not on file   Years of education: Not on file   Highest education level: Not on file  Occupational History   Not on file  Tobacco Use   Smoking status: Never   Smokeless tobacco: Never  Substance and Sexual Activity   Alcohol use: No   Drug use: No   Sexual activity: Never  Other Topics Concern   Not on file  Social History Narrative   Not on file   Social Determinants of Health   Financial Resource Strain: Not on file  Food Insecurity: Not on file  Transportation Needs: Not on file  Physical Activity: Not on file  Stress: Not on file  Social Connections: Not on file  There were no vitals filed for this visit. Body mass index is 33.95 kg/m.   Physical Exam  {[12+ exam elements]} ***  ASSESSMENT AND PLAN:  [name] was seen today for *** months follow-up.  No orders of the defined types were placed in this encounter.   No problem-specific Assessment & Plan notes found for this encounter.    No follow-ups on file.   Betty G. [place], MD  [facility]. [place] office.

## 2020-11-09 NOTE — Patient Instructions (Addendum)
A few things to remember from today's visit:  Essential hypertension, benign - Plan: spironolactone (ALDACTONE) 25 MG tablet  Hypokalemia - Plan: potassium chloride SA (KLOR-CON M20) 20 MEQ tablet  Hyperlipemia, mixed  Prediabetes  If you need refills please call your pharmacy. Do not use My Chart to request refills or for acute issues that need immediate attention.  Today we decreased potasium supplemental from 2 tabs daily to 1 tab daily. We added spironolactone to help with potassium, so we need to re-check potassium in 3-4 weeks,before if cramps or palpitations or dizziness. Fasting labs.  Continue Tylenol and call for Tramadol if needed. Fall precautions. Monitor blood pressure.  Please be sure medication list is accurate. If a new problem present, please set up appointment sooner than planned today.

## 2020-11-09 NOTE — Assessment & Plan Note (Addendum)
Problem has been persistent despite of K-Lor supplementation. Today we added spironolactone 25 mg. We discussed side effects of medication and the risk of interaction with some of her other medications. We decreased K-Lor from 20 mEq twice daily to 20 mEq daily. For now I am holding on aldosterone/renin studies as far as BP is adequately controlled and K+ remains stable. BMP in 3 to 4 weeks. Clearly instructed about warning signs.

## 2020-11-09 NOTE — Assessment & Plan Note (Signed)
Continue pravastatin 20 mg daily and low-fat diet. We will arrange appointment for fasting labs.

## 2020-11-09 NOTE — Assessment & Plan Note (Signed)
BP has improved, SBP still slightly elevated. Continue losartan 50 mg daily and metoprolol succinate 100 mg daily. Spironolactone 25 mg added today. We discussed some side effects of medications. Continue monitoring BP at home. Continue low-salt diet. Eye exam is current.

## 2020-11-09 NOTE — Assessment & Plan Note (Signed)
We discussed the importance of a healthy lifestyle for diabetes prevention. We reviewed current some recommendations for prediabetes treatment, which include pharmacologic treatment with metformin.  She is not interested in adding medications and I think it is appropriate. We will continue monitoring.

## 2020-12-07 ENCOUNTER — Other Ambulatory Visit: Payer: Self-pay

## 2020-12-07 ENCOUNTER — Other Ambulatory Visit (INDEPENDENT_AMBULATORY_CARE_PROVIDER_SITE_OTHER): Payer: Medicare Other

## 2020-12-07 DIAGNOSIS — E876 Hypokalemia: Secondary | ICD-10-CM

## 2020-12-07 DIAGNOSIS — R7303 Prediabetes: Secondary | ICD-10-CM

## 2020-12-07 DIAGNOSIS — N1831 Chronic kidney disease, stage 3a: Secondary | ICD-10-CM | POA: Diagnosis not present

## 2020-12-07 DIAGNOSIS — I10 Essential (primary) hypertension: Secondary | ICD-10-CM

## 2020-12-07 DIAGNOSIS — E782 Mixed hyperlipidemia: Secondary | ICD-10-CM

## 2020-12-07 LAB — COMPREHENSIVE METABOLIC PANEL
ALT: 23 U/L (ref 0–35)
AST: 19 U/L (ref 0–37)
Albumin: 4.4 g/dL (ref 3.5–5.2)
Alkaline Phosphatase: 97 U/L (ref 39–117)
BUN: 10 mg/dL (ref 6–23)
CO2: 30 mEq/L (ref 19–32)
Calcium: 9.7 mg/dL (ref 8.4–10.5)
Chloride: 102 mEq/L (ref 96–112)
Creatinine, Ser: 1.07 mg/dL (ref 0.40–1.20)
GFR: 53.4 mL/min — ABNORMAL LOW (ref >60.00)
Glucose, Bld: 101 mg/dL — ABNORMAL HIGH (ref 70–99)
Potassium: 3.4 mEq/L — ABNORMAL LOW (ref 3.5–5.1)
Sodium: 142 mEq/L (ref 135–145)
Total Bilirubin: 0.4 mg/dL (ref 0.2–1.2)
Total Protein: 8.9 g/dL — ABNORMAL HIGH (ref 6.0–8.3)

## 2020-12-07 LAB — MICROALBUMIN / CREATININE URINE RATIO
Creatinine,U: 250.1 mg/dL
Microalb Creat Ratio: 29.1 mg/g (ref 0.0–30.0)
Microalb, Ur: 72.7 mg/dL — ABNORMAL HIGH (ref 0.0–1.9)

## 2020-12-07 LAB — LIPID PANEL
Cholesterol: 207 mg/dL — ABNORMAL HIGH (ref 0–200)
HDL: 52.4 mg/dL (ref >39.00)
LDL Cholesterol: 121 mg/dL — ABNORMAL HIGH (ref 0–99)
NonHDL: 154.64
Total CHOL/HDL Ratio: 4
Triglycerides: 168 mg/dL — ABNORMAL HIGH (ref 0.0–149.0)
VLDL: 33.6 mg/dL (ref 0.0–40.0)

## 2020-12-07 LAB — HEMOGLOBIN A1C: Hgb A1c MFr Bld: 6.1 % (ref 4.6–6.5)

## 2020-12-31 ENCOUNTER — Other Ambulatory Visit: Payer: Self-pay | Admitting: Family Medicine

## 2021-01-07 ENCOUNTER — Other Ambulatory Visit: Payer: Self-pay | Admitting: Family Medicine

## 2021-01-07 DIAGNOSIS — I10 Essential (primary) hypertension: Secondary | ICD-10-CM

## 2021-01-11 ENCOUNTER — Other Ambulatory Visit: Payer: Self-pay | Admitting: Family Medicine

## 2021-01-18 DIAGNOSIS — Z1231 Encounter for screening mammogram for malignant neoplasm of breast: Secondary | ICD-10-CM | POA: Diagnosis not present

## 2021-01-18 LAB — HM MAMMOGRAPHY

## 2021-01-19 DIAGNOSIS — H401132 Primary open-angle glaucoma, bilateral, moderate stage: Secondary | ICD-10-CM | POA: Diagnosis not present

## 2021-01-19 DIAGNOSIS — H2511 Age-related nuclear cataract, right eye: Secondary | ICD-10-CM | POA: Diagnosis not present

## 2021-01-23 ENCOUNTER — Encounter: Payer: Self-pay | Admitting: Family Medicine

## 2021-01-31 ENCOUNTER — Other Ambulatory Visit: Payer: Self-pay | Admitting: Family Medicine

## 2021-01-31 DIAGNOSIS — I10 Essential (primary) hypertension: Secondary | ICD-10-CM

## 2021-01-31 DIAGNOSIS — H401132 Primary open-angle glaucoma, bilateral, moderate stage: Secondary | ICD-10-CM | POA: Diagnosis not present

## 2021-01-31 DIAGNOSIS — H2511 Age-related nuclear cataract, right eye: Secondary | ICD-10-CM | POA: Diagnosis not present

## 2021-01-31 DIAGNOSIS — J309 Allergic rhinitis, unspecified: Secondary | ICD-10-CM

## 2021-01-31 DIAGNOSIS — I6529 Occlusion and stenosis of unspecified carotid artery: Secondary | ICD-10-CM | POA: Diagnosis not present

## 2021-01-31 DIAGNOSIS — H8110 Benign paroxysmal vertigo, unspecified ear: Secondary | ICD-10-CM

## 2021-02-19 ENCOUNTER — Encounter (HOSPITAL_COMMUNITY): Payer: Self-pay | Admitting: Emergency Medicine

## 2021-02-19 ENCOUNTER — Inpatient Hospital Stay (HOSPITAL_COMMUNITY)
Admission: EM | Admit: 2021-02-19 | Discharge: 2021-02-21 | DRG: 151 | Disposition: A | Discharge status: Home / Self Care | Payer: Medicare Other | Attending: Internal Medicine | Admitting: Internal Medicine

## 2021-02-19 ENCOUNTER — Other Ambulatory Visit: Payer: Self-pay

## 2021-02-19 DIAGNOSIS — R040 Epistaxis: Principal | ICD-10-CM | POA: Diagnosis present

## 2021-02-19 DIAGNOSIS — I129 Hypertensive chronic kidney disease with stage 1 through stage 4 chronic kidney disease, or unspecified chronic kidney disease: Secondary | ICD-10-CM | POA: Diagnosis present

## 2021-02-19 DIAGNOSIS — E782 Mixed hyperlipidemia: Secondary | ICD-10-CM | POA: Diagnosis not present

## 2021-02-19 DIAGNOSIS — Z6833 Body mass index (BMI) 33.0-33.9, adult: Secondary | ICD-10-CM

## 2021-02-19 DIAGNOSIS — Z7982 Long term (current) use of aspirin: Secondary | ICD-10-CM

## 2021-02-19 DIAGNOSIS — E669 Obesity, unspecified: Secondary | ICD-10-CM | POA: Diagnosis present

## 2021-02-19 DIAGNOSIS — Z833 Family history of diabetes mellitus: Secondary | ICD-10-CM | POA: Diagnosis not present

## 2021-02-19 DIAGNOSIS — N1831 Chronic kidney disease, stage 3a: Secondary | ICD-10-CM | POA: Diagnosis not present

## 2021-02-19 DIAGNOSIS — E876 Hypokalemia: Secondary | ICD-10-CM | POA: Diagnosis not present

## 2021-02-19 DIAGNOSIS — R7303 Prediabetes: Secondary | ICD-10-CM | POA: Diagnosis present

## 2021-02-19 DIAGNOSIS — Z880 Allergy status to penicillin: Secondary | ICD-10-CM

## 2021-02-19 DIAGNOSIS — Z20822 Contact with and (suspected) exposure to covid-19: Secondary | ICD-10-CM | POA: Diagnosis present

## 2021-02-19 DIAGNOSIS — D62 Acute posthemorrhagic anemia: Secondary | ICD-10-CM | POA: Diagnosis present

## 2021-02-19 DIAGNOSIS — Z8249 Family history of ischemic heart disease and other diseases of the circulatory system: Secondary | ICD-10-CM

## 2021-02-19 DIAGNOSIS — I10 Essential (primary) hypertension: Secondary | ICD-10-CM | POA: Diagnosis not present

## 2021-02-19 DIAGNOSIS — Z79899 Other long term (current) drug therapy: Secondary | ICD-10-CM

## 2021-02-19 DIAGNOSIS — J309 Allergic rhinitis, unspecified: Secondary | ICD-10-CM | POA: Diagnosis present

## 2021-02-19 DIAGNOSIS — R55 Syncope and collapse: Secondary | ICD-10-CM | POA: Diagnosis present

## 2021-02-19 DIAGNOSIS — H409 Unspecified glaucoma: Secondary | ICD-10-CM | POA: Diagnosis present

## 2021-02-19 LAB — ABO/RH: ABO/RH(D): B POS

## 2021-02-19 LAB — CBC WITH DIFFERENTIAL/PLATELET
Abs Immature Granulocytes: 0.04 10*3/uL (ref 0.00–0.07)
Basophils Absolute: 0 10*3/uL (ref 0.0–0.1)
Basophils Relative: 0 %
Eosinophils Absolute: 0 10*3/uL (ref 0.0–0.5)
Eosinophils Relative: 0 %
HCT: 40.1 % (ref 36.0–46.0)
Hemoglobin: 12.3 g/dL (ref 12.0–15.0)
Immature Granulocytes: 0 %
Lymphocytes Relative: 23 %
Lymphs Abs: 2.5 10*3/uL (ref 0.7–4.0)
MCH: 26.3 pg (ref 26.0–34.0)
MCHC: 30.7 g/dL (ref 30.0–36.0)
MCV: 85.9 fL (ref 80.0–100.0)
Monocytes Absolute: 0.5 10*3/uL (ref 0.1–1.0)
Monocytes Relative: 5 %
Neutro Abs: 7.9 10*3/uL — ABNORMAL HIGH (ref 1.7–7.7)
Neutrophils Relative %: 72 %
Platelets: 304 10*3/uL (ref 150–400)
RBC: 4.67 MIL/uL (ref 3.87–5.11)
RDW: 17.1 % — ABNORMAL HIGH (ref 11.5–15.5)
WBC: 11 10*3/uL — ABNORMAL HIGH (ref 4.0–10.5)
nRBC: 0 % (ref 0.0–0.2)

## 2021-02-19 LAB — RESP PANEL BY RT-PCR (FLU A&B, COVID) ARPGX2
Influenza A by PCR: NEGATIVE
Influenza B by PCR: NEGATIVE
SARS Coronavirus 2 by RT PCR: NEGATIVE

## 2021-02-19 LAB — I-STAT CHEM 8, ED
BUN: 35 mg/dL — ABNORMAL HIGH (ref 8–23)
Calcium, Ion: 1.12 mmol/L — ABNORMAL LOW (ref 1.15–1.40)
Chloride: 108 mmol/L (ref 98–111)
Creatinine, Ser: 1.3 mg/dL — ABNORMAL HIGH (ref 0.44–1.00)
Glucose, Bld: 117 mg/dL — ABNORMAL HIGH (ref 70–99)
HCT: 40 % (ref 36.0–46.0)
Hemoglobin: 13.6 g/dL (ref 12.0–15.0)
Potassium: 3.9 mmol/L (ref 3.5–5.1)
Sodium: 142 mmol/L (ref 135–145)
TCO2: 24 mmol/L (ref 22–32)

## 2021-02-19 LAB — COMPREHENSIVE METABOLIC PANEL
ALT: 23 U/L (ref 0–44)
AST: 17 U/L (ref 15–41)
Albumin: 3.2 g/dL — ABNORMAL LOW (ref 3.5–5.0)
Alkaline Phosphatase: 67 U/L (ref 38–126)
Anion gap: 10 (ref 5–15)
BUN: 30 mg/dL — ABNORMAL HIGH (ref 8–23)
CO2: 22 mmol/L (ref 22–32)
Calcium: 9 mg/dL (ref 8.9–10.3)
Chloride: 108 mmol/L (ref 98–111)
Creatinine, Ser: 1.27 mg/dL — ABNORMAL HIGH (ref 0.44–1.00)
GFR, Estimated: 46 mL/min — ABNORMAL LOW (ref >60)
Glucose, Bld: 119 mg/dL — ABNORMAL HIGH (ref 70–99)
Potassium: 3.7 mmol/L (ref 3.5–5.1)
Sodium: 140 mmol/L (ref 135–145)
Total Bilirubin: 0.5 mg/dL (ref 0.3–1.2)
Total Protein: 6.9 g/dL (ref 6.5–8.1)

## 2021-02-19 LAB — TYPE AND SCREEN
ABO/RH(D): B POS
Antibody Screen: POSITIVE
DAT, IgG: NEGATIVE

## 2021-02-19 MED ORDER — ONDANSETRON HCL 4 MG/2ML IJ SOLN: 4.0000 mg | Freq: Four times a day (QID) | INTRAMUSCULAR | Status: DC | PRN

## 2021-02-19 MED ORDER — OXYMETAZOLINE HCL 0.05 % NA SOLN
1.0000 | Freq: Once | NASAL | Status: AC
  Administered 2021-02-19: 1 via NASAL
  Filled 2021-02-19: qty 30

## 2021-02-19 MED ORDER — LATANOPROSTENE BUNOD 0.024 % OP SOLN: 1.0000 [drp] | Freq: Every day | OPHTHALMIC | Status: DC

## 2021-02-19 MED ORDER — HYDRALAZINE HCL 20 MG/ML IJ SOLN
5.0000 mg | INTRAMUSCULAR | Status: DC | PRN
  Administered 2021-02-20 (×2): 5 mg via INTRAVENOUS
  Filled 2021-02-19 (×2): qty 1

## 2021-02-19 MED ORDER — LACTATED RINGERS IV SOLN: INTRAVENOUS | Status: DC

## 2021-02-19 MED ORDER — TIMOLOL MALEATE 0.5 % OP SOLN
1.0000 [drp] | Freq: Two times a day (BID) | OPHTHALMIC | Status: DC
  Administered 2021-02-19 – 2021-02-21 (×4): 1 [drp] via OPHTHALMIC
  Filled 2021-02-19: qty 5

## 2021-02-19 MED ORDER — POLYETHYLENE GLYCOL 3350 17 G PO PACK: 17.0000 g | PACK | Freq: Every day | ORAL | Status: DC | PRN

## 2021-02-19 MED ORDER — METOPROLOL SUCCINATE ER 100 MG PO TB24
100.0000 mg | ORAL_TABLET | Freq: Every day | ORAL | Status: DC
  Administered 2021-02-19 – 2021-02-21 (×3): 100 mg via ORAL
  Filled 2021-02-19 (×3): qty 1

## 2021-02-19 MED ORDER — MORPHINE SULFATE (PF) 2 MG/ML IV SOLN: 2.0000 mg | INTRAVENOUS | Status: DC | PRN

## 2021-02-19 MED ORDER — TRAMADOL HCL 50 MG PO TABS: 50.0000 mg | ORAL_TABLET | Freq: Every day | ORAL | Status: DC | PRN

## 2021-02-19 MED ORDER — ACETAMINOPHEN 325 MG PO TABS
650.0000 mg | ORAL_TABLET | Freq: Four times a day (QID) | ORAL | Status: DC | PRN
  Administered 2021-02-19: 650 mg via ORAL
  Filled 2021-02-19: qty 2

## 2021-02-19 MED ORDER — SODIUM CHLORIDE 0.9% FLUSH
3.0000 mL | Freq: Two times a day (BID) | INTRAVENOUS | Status: DC
  Administered 2021-02-20 – 2021-02-21 (×3): 3 mL via INTRAVENOUS

## 2021-02-19 MED ORDER — LORATADINE 10 MG PO TABS
10.0000 mg | ORAL_TABLET | Freq: Every day | ORAL | Status: DC
  Administered 2021-02-19 – 2021-02-21 (×3): 10 mg via ORAL
  Filled 2021-02-19 (×3): qty 1

## 2021-02-19 MED ORDER — DOCUSATE SODIUM 100 MG PO CAPS
100.0000 mg | ORAL_CAPSULE | Freq: Two times a day (BID) | ORAL | Status: DC
  Administered 2021-02-20 – 2021-02-21 (×3): 100 mg via ORAL
  Filled 2021-02-19 (×4): qty 1

## 2021-02-19 MED ORDER — LATANOPROST 0.005 % OP SOLN
1.0000 [drp] | Freq: Every day | OPHTHALMIC | Status: DC
  Administered 2021-02-19 – 2021-02-20 (×2): 1 [drp] via OPHTHALMIC
  Filled 2021-02-19: qty 2.5

## 2021-02-19 MED ORDER — BRIMONIDINE TARTRATE 0.2 % OP SOLN
1.0000 [drp] | Freq: Two times a day (BID) | OPHTHALMIC | Status: DC
  Administered 2021-02-19 – 2021-02-21 (×4): 1 [drp] via OPHTHALMIC
  Filled 2021-02-19: qty 5

## 2021-02-19 MED ORDER — BISACODYL 5 MG PO TBEC: 5.0000 mg | DELAYED_RELEASE_TABLET | Freq: Every day | ORAL | Status: DC | PRN

## 2021-02-19 MED ORDER — ONDANSETRON HCL 4 MG PO TABS: 4.0000 mg | ORAL_TABLET | Freq: Four times a day (QID) | ORAL | Status: DC | PRN

## 2021-02-19 MED ORDER — PRAVASTATIN SODIUM 10 MG PO TABS
20.0000 mg | ORAL_TABLET | Freq: Every day | ORAL | Status: DC
  Administered 2021-02-19 – 2021-02-21 (×3): 20 mg via ORAL
  Filled 2021-02-19 (×3): qty 2

## 2021-02-19 MED ORDER — BRIMONIDINE TARTRATE-TIMOLOL 0.2-0.5 % OP SOLN
1.0000 [drp] | Freq: Two times a day (BID) | OPHTHALMIC | Status: DC
  Filled 2021-02-19: qty 5

## 2021-02-19 MED ORDER — ACETAMINOPHEN 650 MG RE SUPP: 650.0000 mg | Freq: Four times a day (QID) | RECTAL | Status: DC | PRN

## 2021-02-19 NOTE — Assessment & Plan Note (Signed)
-  Continue Pravachol ?

## 2021-02-19 NOTE — Assessment & Plan Note (Signed)
-  Weight loss should be encouraged °-Outpatient PCP/bariatric medicine/bariatric surgery f/u encouraged °

## 2021-02-19 NOTE — ED Notes (Signed)
Notified Dr. Ophelia Charter of BP trending 192/114. No new orders at this time.

## 2021-02-19 NOTE — Assessment & Plan Note (Addendum)
-  Patient presenting with epistaxis -She had a similar episode also requiring overnight monitoring 1 year ago -Nasal packing in place and appears to have stabilized -Will monitor overnight -Large volume bloody sputum/emesis likely related to post-nasal drip -ENT to consult -recheck CBC in AM

## 2021-02-19 NOTE — ED Notes (Addendum)
While this RN was assisting patient with ambulating to restroom, patient took approximately 5 steps and suddenly stated "I feel dizzy" , got unsteady on feet and slide down onto this RN's legs. Pt at first did not hit the floor and myself and a phlebotomist were able to have patient sit in chair. Pt was in chair, then started to have agonal respirations and unresponsive. Keenan Bachelor, [place] was called to assist with this along with other ED personnel. While moving patient from chair to bed she did slide fully to ground and then on bed. Patient was then settled into Tra A bed, hooked up to cardiac monitoring. Respiratory began bagging patient for a short period of time (1 min approx) and patient now alert and responsive. Pt's BP now 90s/50s. 1 L NS bolus being administered. Patient then coughed up approximately 250 mls of blood. [place], [place] aware. PA administered packing to L nare and bleeding stopped.Pt now normotensive. Aox4. Denies pain at this time.

## 2021-02-19 NOTE — Assessment & Plan Note (Signed)
-  Appears to be stable at this time -Recheck BMP in AM -Consider resumption of Cozaar at that time

## 2021-02-19 NOTE — Assessment & Plan Note (Signed)
-  Claritin substituted for Allegra due to formulary -She appears to have been using both Astelin and Flonase so I discontinued Flonase; this likely increased her chances for epistaxis

## 2021-02-19 NOTE — Assessment & Plan Note (Signed)
-  Resume home Toprol XL -Hold Cozaar given mildly abnormal renal function and apparent orthostatic episode

## 2021-02-19 NOTE — ED Triage Notes (Signed)
Pt reports nosebleed since 8:30am.  Still bleeding on arrival.  Pt has tissues packed in nose.  Hypertensive.

## 2021-02-19 NOTE — ED Provider Notes (Signed)
Emergency Medicine Provider Triage Evaluation Note  Meghan Nichols , a 71 y.o. female  was evaluated in triage.  Pt complains of nosebleed.  Started at 830 and has been ongoing.  She has tried holding pressure and putting packing in the nose without improvement, feels like it is running down the back of her throat around the packing.  History of prior nosebleeds.  Not on blood thinners.  Review of Systems  Positive: Epistaxis Negative: Syncope  Physical Exam  BP (!) 165/125 (BP Location: Right Arm)    Pulse (!) 112    Temp 99.5 F (37.5 C) (Oral)    Resp 18    SpO2 99%  Gen:   Awake, no distress   Resp:  Normal effort  MSK:   Moves extremities without difficulty  Other:  Bleeding from the left nostril with packing in place  Medical Decision Making  Medically screening exam initiated at 10:29 AM.  Appropriate orders placed.  Bailynn Dyk was informed that the remainder of the evaluation will be completed by another provider, this initial triage assessment does not replace that evaluation, and the importance of remaining in the ED until their evaluation is complete.     Dartha Lodge, PA-C 02/19/21 1042    Cathren Laine, MD 02/20/21 [phone]

## 2021-02-19 NOTE — Assessment & Plan Note (Addendum)
-  Witnessed episode in the ER while transferring to/from bathroom -This is her 3rd lifetime episode, all while hospitalized under similar circumstances -Appears to have been orthostatic/vasovagal, as her BP was significantly lower than prior when checked immediately after the episode -Will monitor on telemetry overnight

## 2021-02-19 NOTE — ED Provider Notes (Signed)
[facility] Provider Note   CSN: [id number] Arrival date & time: 02/19/21  1022     History  Chief Complaint  Patient presents with   Epistaxis    Meghan Nichols is a 71 y.o. female presenting for nosebleed.  Patient states around 830 this morning she was watching TV when she developed a nosebleed from her left nare.  In the past several years, she has had occasional nosebleeds.  She reports recently she has had increased nasal congestion, has been treating this with Flonase.  She also reports a mild cough and postnasal drip.  No fevers.  She is not on any blood thinners.  She put tissues in her nose to try and pack, but has not done anything else for her nosebleed today.  She denies headache, vision changes, slurred speech.  No trauma or injury.  No dizziness or weakness  HPI     Home Medications Prior to Admission medications   Medication Sig Start Date End Date Taking? Authorizing Provider  Ascorbic Acid (VITAMIN C) 100 MG tablet Take 100 mg by mouth daily.   Yes [provider]  aspirin 81 MG tablet Take 81 mg by mouth daily.   Yes [provider]  azelastine (ASTELIN) 0.1 % nasal spray Place 2 sprays into both nostrils 2 (two) times daily. Use in each nostril as directed 03/02/16  Yes [place], Betty G, MD  cholecalciferol (VITAMIN D) 1000 UNITS tablet Take 1,000 Units by mouth daily.   Yes [provider]  fexofenadine (ALLEGRA) 180 MG tablet TAKE 1 TABLET BY MOUTH EVERY DAY 01/31/21  Yes [place], Betty G, MD  fluticasone [facility](FLONASE) 50 MCG/ACT nasal spray Place 1 spray into both nostrils 2 (two) times daily. Patient taking differently: Place 1 spray into both nostrils daily as needed for allergies. 03/02/16  Yes [place], Betty G, MD  losartan (COZAAR) 50 MG tablet Take 1 tablet (50 mg total) by mouth daily. 04/22/20  Yes Pricilla Riffleoss, Paula V, MD  metoprolol succinate (TOPROL-XL) 100 MG 24 hr tablet TAKE 1 TABLET EVERY DAY WITH A  MEAL 01/09/21  Yes [place], Betty G, MD  potassium chloride SA (KLOR-CON M20) 20 MEQ tablet Take 1 tablet (20 mEq total) by mouth daily. 11/09/20  Yes [place], Betty G, MD  pravastatin (PRAVACHOL) 20 MG tablet TAKE 1 TABLET BY MOUTH EVERY DAY 01/02/21  Yes [place], Betty G, MD  spironolactone (ALDACTONE) 25 MG tablet TAKE 1 TABLET (25 MG TOTAL) BY MOUTH DAILY. 01/31/21  Yes [place], Betty G, MD  traMADol (ULTRAM) 50 MG tablet Take 1 tablet (50 mg total) by mouth daily as needed. Patient taking differently: Take 50 mg by mouth daily as needed for moderate pain. 02/02/20  Yes [place], Betty G, MD  VITAMIN E PO Take 1 tablet by mouth daily.   Yes [provider]  VYZULTA 0.024 % SOLN Place 1 drop into both eyes daily. 06/26/19  Yes [provider]  brimonidine-timolol (COMBIGAN) 0.2-0.5 % ophthalmic solution Place 1 drop into the left eye 2 (two) times daily. 11/04/20   [provider]      Allergies    Penicillins    Review of Systems   Review of Systems  HENT:  Positive for congestion and nosebleeds.   All other systems reviewed and are negative.  Physical Exam Updated Vital Signs BP (!) 187/102    Pulse 94    Temp 99.5 F (37.5 C) (Oral)    Resp 16  SpO2 99%  Physical Exam Vitals and nursing note reviewed.  Constitutional:      General: She is not in acute distress.    Appearance: Normal appearance.  HENT:     Head: Normocephalic and atraumatic.     Right Ear: A middle ear effusion is present.     Left Ear: A middle ear effusion is present.     Ears:     Comments: Fluid noted behind bilateral TMs, however no erythema or bulging of the TMs    Nose: Mucosal edema present.     Left Nostril: Epistaxis present.  Eyes:     Conjunctiva/sclera: Conjunctivae normal.     Pupils: Pupils are equal, round, and reactive to light.  Cardiovascular:     Rate and Rhythm: Normal rate and regular rhythm.     Pulses: Normal pulses.  Pulmonary:     Effort: Pulmonary effort is  normal. No respiratory distress.     Breath sounds: Normal breath sounds. No wheezing.     Comments: Speaking in full sentences.  Clear lung sounds in all fields. Abdominal:     General: There is no distension.     Palpations: Abdomen is soft. There is no mass.     Tenderness: There is no abdominal tenderness. There is no guarding or rebound.  Musculoskeletal:        General: Normal range of motion.     Cervical back: Normal range of motion and neck supple.  Skin:    General: Skin is warm and dry.     Capillary Refill: Capillary refill takes less than 2 seconds.  Neurological:     Mental Status: She is alert and oriented to person, place, and time.  Psychiatric:        Mood and Affect: Mood and affect normal.        Speech: Speech normal.        Behavior: Behavior normal.    ED Results / Procedures / Treatments   Labs (all labs ordered are listed, but only abnormal results are displayed) Labs Reviewed  COMPREHENSIVE METABOLIC PANEL - Abnormal; Notable for the following components:      Result Value   Glucose, Bld 119 (*)    BUN 30 (*)    Creatinine, Ser 1.27 (*)    Albumin 3.2 (*)    GFR, Estimated 46 (*)    All other components within normal limits  CBC WITH DIFFERENTIAL/PLATELET - Abnormal; Notable for the following components:   WBC 11.0 (*)    RDW 17.1 (*)    Neutro Abs 7.9 (*)    All other components within normal limits  I-STAT CHEM 8, ED - Abnormal; Notable for the following components:   BUN 35 (*)    Creatinine, Ser 1.30 (*)    Glucose, Bld 117 (*)    Calcium, Ion 1.12 (*)    All other components within normal limits  RESP PANEL BY RT-PCR (FLU A&B, COVID) [id number]  TYPE AND SCREEN  ABO/RH    EKG EKG Interpretation  Date/Time:  Sunday February 19 2021 13:20:21 EST Ventricular Rate:  74 PR Interval:  132 QRS Duration: 74 QT Interval:  387 QTC Calculation: 430 R Axis:   10 Text Interpretation: Sinus rhythm Confirmed by Cathren Laine ([id number]) on 02/19/2021  1:42:59 PM  Radiology No results found.  Procedures .Epistaxis Management  Date/Time: 02/19/2021 2:39 PM Performed by: Alveria Apley, PA-C Authorized by: Alveria Apley, PA-C   Consent:    Consent obtained:  Verbal  Consent given by:  Patient   Risks discussed:  Bleeding, pain and infection Anesthesia:    Anesthesia method:  None Procedure details:    Treatment site:  R anterior   Treatment method:  Nasal balloon   Treatment episode: recurring   Post-procedure details:    Assessment:  Bleeding decreased   Procedure completion:  Tolerated well, no immediate complications    Medications Ordered in ED Medications  oxymetazoline (AFRIN) 0.05 % nasal spray 1 spray (1 spray Each Nare Given 02/19/21 1115)    ED Course/ Medical Decision Making/ A&P                           Medical Decision Making   This patient presents to the ED for concern of epistaxis. This involves an  number of treatment options, and is a complaint that carries with it a moderate risk of complications and morbidity.  Initially on evaluation, patient with a slow ooze, appeared to be anterior bleed from the left nare.  Vitals reassuring.  Will pack with Afrin soaked gauze and reassess.  On reassessment, patient continues to have some oozing, but is improved.  Repacked with Afrin gauze.  On reevaluation, patient is no longer bleeding anteriorly.  We will continue to monitor.  She is coughing up some blood, this may be blood that was previously swallowed.  Will continue to monitor.  Patient had syncopal event while walking to the bathroom.  She felt dizzy and lightheaded prior to passing out, was unresponsive for about a minute.  She is assisted back to the bed, was cool and clammy.  Blood pressure significantly lower than it had been, 90 systolic and previously had been 160-170 systolic.  Patient slowly became alert and oriented, blood pressure improved.  Will give fluids.  Likely orthostatic hypotension,  may be due to hypovolemia.  Will check labs including hemoglobin.  EKG ordered.  Patient had large volume episode of coughing/spitting up blood with at least 200- 300 cc of blood and clots.  Nasal packing performed as described above with rapid Rhino.  Patient tolerated well.  Bleeding improved.  Will consult with ENT. Labs interpreted by me, hemoglobin stable at this time.  However BUN significantly elevated from baseline.  She will likely need trending of her blood pressure and hemoglobin. EKG shows NSR.  Discussed with Dr. Pollyann Kennedy from ENT who will consult on patient and recommends medicine admit.  Discussed with Dr. Ophelia Charter from [facility] hospitalist service, patient to be admitted  Final Clinical Impression(s) / ED Diagnoses Final diagnoses:  Epistaxis  Syncope, unspecified syncope type    Rx / DC Orders ED Discharge Orders     None         Alveria Apley, PA-C 02/19/21 1507    Cathren Laine, MD 02/20/21 0800

## 2021-02-19 NOTE — Assessment & Plan Note (Signed)
-  Recent A1c was 6.1 -Does not need monitoring at this time

## 2021-02-19 NOTE — H&P (Signed)
History and Physical    Patient: Meghan Nichols [id number] DOB: [birth year] DOA: 02/19/2021 DOS: the patient was seen and examined on 02/19/2021 PCP: [place], Betty G, MD  Patient coming from: Home - lives with spouse but they are "not on good terms"; NOK; Lexine, Wigley, [phone]  Chief Complaint: Epistaxis  HPI: Meghan Nichols is a 71 y.o. female with medical history significant of HTN and HLD presenting with epistaxis.  She reports that she was admitted for exactly this scenario one year ago - and records show it was in [place] from 12/27-28.  She also had syncope then when going to the bathroom (as well as after she had her child 50 years ago).  She reports that she got up this AM and went to watch tv.  She noticed her nose dripping, which was unusual for her; she went to the bathroom and discovered that it was bleeding.  She finally got it to stop and then it started again and so she came to the ER about 10.  She went to the bathroom and had a syncopal episode, witnessed by nursing staff.  She also had 2 episodes of coughing up large amounts of blood and clot.  Rhino-rocket was placed and bleeding has stabilized.  It does look like she has been using both Astelin (BID) and Flonase (BID prn) nasal steroids.    ER Course:  Epistaxis, improved.  Had syncope in the ER, BP 160 -> 90.  Had a large episode of coughing blood and clots, appears to be stabilized.  Needs BP and Hgb observation.  Has rhinorocket in place.  ENT will consult.    Review of Systems: ROS reviewed and negative except as above Past Medical History:  Diagnosis Date   High cholesterol    Hypertension    Past Surgical History:  Procedure Laterality Date   ABDOMINAL HYSTERECTOMY     Social History:  reports that she has never smoked. She has never used smokeless tobacco. She reports that she does not drink alcohol and does not use drugs.  Allergies  Allergen Reactions   Penicillins Hives    Has patient had  a PCN reaction causing immediate rash, facial/tongue/throat swelling, SOB or lightheadedness with hypotension: Yes Has patient had a PCN reaction causing severe rash involving mucus membranes or skin necrosis: Unknown Has patient had a PCN reaction that required hospitalization: No Has patient had a PCN reaction occurring within the last 10 years: No If all of the above answers are "NO", then may proceed with Cephalosporin use.     Family History  Problem Relation Age of Onset   Hypertension Mother    Hypertension Father    Hypertension Sister    Diabetes Sister    Hypertension Maternal Grandmother    Hypertension Maternal Grandfather    Diabetes Sister     Prior to Admission medications   Medication Sig Start Date End Date Taking? Authorizing Provider  Ascorbic Acid (VITAMIN C) 100 MG tablet Take 100 mg by mouth daily.    [provider]  aspirin 81 MG tablet Take 81 mg by mouth daily.    [provider]  azelastine (ASTELIN) 0.1 % nasal spray Place 2 sprays into both nostrils 2 (two) times daily. Use in each nostril as directed 03/02/16   [place], Betty G, MD  cholecalciferol (VITAMIN D) 1000 UNITS tablet Take 1,000 Units by mouth daily.    [provider]  fexofenadine (ALLEGRA) 180 MG tablet TAKE 1 TABLET BY MOUTH EVERY  DAY 01/31/21   [place], Betty G, MD  fluticasone [facility]) 50 MCG/ACT nasal spray Place 1 spray into both nostrils 2 (two) times daily. 03/02/16   [place], Betty G, MD  losartan (COZAAR) 50 MG tablet Take 1 tablet (50 mg total) by mouth daily. 04/22/20   Fay Records, MD  metoprolol succinate (TOPROL-XL) 100 MG 24 hr tablet TAKE 1 TABLET EVERY DAY WITH A MEAL 01/09/21   [place], Betty G, MD  potassium chloride SA (KLOR-CON M20) 20 MEQ tablet Take 1 tablet (20 mEq total) by mouth daily. 11/09/20   [place], Betty G, MD  pravastatin (PRAVACHOL) 20 MG tablet TAKE 1 TABLET BY MOUTH EVERY DAY 01/02/21   [place], Betty G, MD  spironolactone (ALDACTONE) 25  MG tablet TAKE 1 TABLET (25 MG TOTAL) BY MOUTH DAILY. 01/31/21   [place], Betty G, MD  traMADol (ULTRAM) 50 MG tablet Take 1 tablet (50 mg total) by mouth daily as needed. 02/02/20   [place], Betty G, MD  VITAMIN E PO Take 1 tablet by mouth daily.    [provider]  VYZULTA 0.024 % SOLN Place 1 drop into both eyes daily. 06/26/19   [provider]    Physical Exam: Vitals:   02/19/21 1445 02/19/21 1500 02/19/21 1514 02/19/21 1553  BP: (!) 192/114 (!) 187/102  (!) 188/109  Pulse: 90 94  81  Resp: (!) 21 16  16   Temp:   (!) 97.3 F (36.3 C) (!) 97.5 F (36.4 C)  TempSrc:   Temporal Oral  SpO2: 98% 99%  100%   General:  Appears calm and comfortable and is in NAD Eyes:  PERRL, EOMI, normal lids, iris ENT:  grossly normal hearing, lips & tongue, mmm; nasal packing in place Neck:  no LAD, masses or thyromegaly Cardiovascular:  RRR, no m/r/g. No LE edema.  Respiratory:   CTA bilaterally with no wheezes/rales/rhonchi.  Normal respiratory effort. Abdomen:  soft, NT, ND Skin:  no rash or induration seen on limited exam Musculoskeletal:  grossly normal tone BUE/BLE, good ROM, no bony abnormality Psychiatric:  grossly normal mood and affect, speech fluent and appropriate, AOx3 Neurologic:  CN 2-12 grossly intact, moves all extremities in coordinated fashion   Radiological Exams on Admission: Independently reviewed - see discussion in A/P where applicable  No results found.  EKG: Independently reviewed.  NSR with rate 74; no evidence of acute ischemia   Labs on Admission: I have personally reviewed the available labs and imaging studies at the time of the admission.  Pertinent labs:    BUN 30/Creatinine 1.27/GFR 46 - stable Albumin 3.2 WBC 11.0 Hgb 12.3 A1c 6.1 on 10/19   Assessment/Plan * Epistaxis not due to trauma- (present on admission) -Patient presenting with epistaxis -She had a similar episode also requiring overnight monitoring 1 year ago -Nasal  packing in place and appears to have stabilized -Will monitor overnight -Large volume bloody sputum/emesis likely related to post-nasal drip -ENT to consult -recheck CBC in AM  Syncope and collapse- (present on admission) -Witnessed episode in the ER while transferring to/from bathroom -This is her 3rd lifetime episode, all while hospitalized under similar circumstances -Appears to have been orthostatic/vasovagal, as her BP was significantly lower than prior when checked immediately after the episode -Will monitor on telemetry overnight  Stage 3a chronic kidney disease ([place])- (present on admission) -Appears to be stable at this time -Recheck BMP in AM -Consider resumption of Cozaar at that time  Prediabetes- (present on admission) -Recent A1c was 6.1 -  Does not need monitoring at this time  Class 1 obesity with body mass index (BMI) of 33.0 to 33.9 in adult -Weight loss should be encouraged -Outpatient PCP/bariatric medicine/bariatric surgery f/u encouraged  Allergic rhinitis- (present on admission) -Claritin substituted for Allegra due to formulary -She appears to have been using both Astelin and Flonase so I discontinued Flonase; this likely increased her chances for epistaxis  Hyperlipemia, mixed- (present on admission) -Continue Pravachol  Essential hypertension, benign- (present on admission) -Resume home Toprol XL -Hold Cozaar given mildly abnormal renal function and apparent orthostatic episode    Advance Care Planning:   Code Status: Full Code   Consults: ENT  Family Communication: She declined having me call her son at the time of admission  Severity of Illness: The appropriate patient status for this patient is OBSERVATION. Observation status is judged to be reasonable and necessary in order to provide the required intensity of service to ensure the patient's safety. The patient's presenting symptoms, physical exam findings, and initial radiographic and  laboratory data in the context of their medical condition is felt to place them at decreased risk for further clinical deterioration. Furthermore, it is anticipated that the patient will be medically stable for discharge from the hospital within 2 midnights of admission.   Author: Karmen Bongo 02/19/2021 4:20 PM  For on call review [email or web address].

## 2021-02-19 NOTE — ED Notes (Signed)
Notified Sofia PA of addition 100 cc of blood that patient has coughed up again.

## 2021-02-20 DIAGNOSIS — R7303 Prediabetes: Secondary | ICD-10-CM

## 2021-02-20 DIAGNOSIS — E669 Obesity, unspecified: Secondary | ICD-10-CM | POA: Diagnosis not present

## 2021-02-20 DIAGNOSIS — N1831 Chronic kidney disease, stage 3a: Secondary | ICD-10-CM | POA: Diagnosis not present

## 2021-02-20 DIAGNOSIS — E782 Mixed hyperlipidemia: Secondary | ICD-10-CM

## 2021-02-20 DIAGNOSIS — R55 Syncope and collapse: Secondary | ICD-10-CM

## 2021-02-20 DIAGNOSIS — R040 Epistaxis: Secondary | ICD-10-CM | POA: Diagnosis not present

## 2021-02-20 DIAGNOSIS — Z6833 Body mass index (BMI) 33.0-33.9, adult: Secondary | ICD-10-CM

## 2021-02-20 DIAGNOSIS — I10 Essential (primary) hypertension: Secondary | ICD-10-CM | POA: Diagnosis not present

## 2021-02-20 DIAGNOSIS — J309 Allergic rhinitis, unspecified: Secondary | ICD-10-CM | POA: Diagnosis not present

## 2021-02-20 LAB — BASIC METABOLIC PANEL
Anion gap: 8 (ref 5–15)
BUN: 22 mg/dL (ref 8–23)
CO2: 27 mmol/L (ref 22–32)
Calcium: 8.8 mg/dL — ABNORMAL LOW (ref 8.9–10.3)
Chloride: 107 mmol/L (ref 98–111)
Creatinine, Ser: 1.01 mg/dL — ABNORMAL HIGH (ref 0.44–1.00)
GFR, Estimated: >60 mL/min (ref >60)
Glucose, Bld: 113 mg/dL — ABNORMAL HIGH (ref 70–99)
Potassium: 3.7 mmol/L (ref 3.5–5.1)
Sodium: 142 mmol/L (ref 135–145)

## 2021-02-20 LAB — CBC
HCT: 32.3 % — ABNORMAL LOW (ref 36.0–46.0)
Hemoglobin: 10.5 g/dL — ABNORMAL LOW (ref 12.0–15.0)
MCH: 26.7 pg (ref 26.0–34.0)
MCHC: 32.5 g/dL (ref 30.0–36.0)
MCV: 82.2 fL (ref 80.0–100.0)
Platelets: 274 10*3/uL (ref 150–400)
RBC: 3.93 MIL/uL (ref 3.87–5.11)
RDW: 16.5 % — ABNORMAL HIGH (ref 11.5–15.5)
WBC: 10.7 10*3/uL — ABNORMAL HIGH (ref 4.0–10.5)
nRBC: 0 % (ref 0.0–0.2)

## 2021-02-20 LAB — HIV ANTIBODY (ROUTINE TESTING W REFLEX): HIV Screen 4th Generation wRfx: NONREACTIVE

## 2021-02-20 MED ORDER — ACETAMINOPHEN 650 MG RE SUPP
650.0000 mg | Freq: Four times a day (QID) | RECTAL | Status: DC | PRN
Start: 2021-02-20 — End: 2021-02-21

## 2021-02-20 MED ORDER — SODIUM CHLORIDE 0.9 % IV BOLUS
500.0000 mL | Freq: Once | INTRAVENOUS | Status: AC
  Administered 2021-02-20: 500 mL via INTRAVENOUS

## 2021-02-20 MED ORDER — HYDRALAZINE HCL 20 MG/ML IJ SOLN
10.0000 mg | Freq: Once | INTRAMUSCULAR | Status: AC
  Administered 2021-02-20: 10 mg via INTRAVENOUS
  Filled 2021-02-20: qty 1

## 2021-02-20 MED ORDER — ACETAMINOPHEN 325 MG PO TABS
650.0000 mg | ORAL_TABLET | Freq: Four times a day (QID) | ORAL | Status: DC | PRN
  Administered 2021-02-20 (×3): 650 mg via ORAL
  Filled 2021-02-20 (×3): qty 2

## 2021-02-20 MED ORDER — SODIUM CHLORIDE 0.9 % IV SOLN: INTRAVENOUS | Status: DC

## 2021-02-20 NOTE — TOC Progression Note (Addendum)
Transition of Care [facility]) - Progression Note    Patient Details  Name: Meghan Nichols MRN: [id number] Date of Birth: [birth year]  Transition of Care [facility]) CM/SW Contact  Melessia Kaus, Edson Snowball, RN Phone Number: 02/20/2021, 11:56 AM  Clinical Narrative:      Transition of Care [facility]) Screening Note   Patient Details  Name: Meghan Nichols Date of Birth: 04-May-1950       Transition of Care Department [facility]) has reviewed patient and no TOC needs have been identified at this time. We will continue to monitor patient advancement through interdisciplinary progression rounds. If new patient transition needs arise, please place a TOC consult.         Expected Discharge Plan and Services                                                 Social Determinants of Health (SDOH) Interventions    Readmission Risk Interventions No flowsheet data found.

## 2021-02-20 NOTE — Progress Notes (Signed)
°   02/20/21 0019  Assess: MEWS Score  Temp 98 F (36.7 C)  BP (!) 202/95 (given PRN Hydralazine)  Pulse Rate 80  Resp 18  SpO2 100 %  O2 Device Room Air  Assess: MEWS Score  MEWS Temp 0  MEWS Systolic 2  MEWS Pulse 0  MEWS RR 0  MEWS LOC 0  MEWS Score 2  MEWS Score Color Yellow  Assess: if the MEWS score is Yellow or Red  Were vital signs taken at a resting state? Yes  Focused Assessment Change from prior assessment (see assessment flowsheet)  Early Detection of Sepsis Score *See Row Information* Low  MEWS guidelines implemented *See Row Information* Yes  Treat  MEWS Interventions Escalated (See documentation below);Administered prn meds/treatments (given PRN Hydralazine 5mg  IV)  Take Vital Signs  Increase Vital Sign Frequency  Yellow: Q 2hr X 2 then Q 4hr X 2, if remains yellow, continue Q 4hrs  Escalate  MEWS: Escalate Yellow: discuss with charge nurse/RN and consider discussing with provider and RRT  Notify: Charge Nurse/RN  Name of Charge Nurse/RN Notified [place],RN  Date Charge Nurse/RN Notified 02/20/21  Time Charge Nurse/RN Notified 0020  Notify: Provider  Provider Name/Title 04/20/21  Date Provider Notified 02/20/21  Time Provider Notified 0022  Notification Type  (securechat)  Notification Reason Change in status  Provider response See new orders  Date of Provider Response 02/20/21  Time of Provider Response 0105  Document  Patient Outcome Stabilized after interventions   0019 BP 202/95. YELLOW MEWS.PRN Hydralazine 5mg  IV was given .X. Bount,NP notified    0102 Repeat BP 199/104. 04/20/21, NP again was notified. new order received Hydralazine 10mg IV- administered.  Repeat BP 140/80. Alert and verbal. Given PRN Tylenol for c/o 7/10 headache.  Call bell within reach and will continue to close monitor

## 2021-02-20 NOTE — Progress Notes (Signed)
PROGRESS NOTE    Meghan Nichols  [id number] DOB: 1950-03-24 DOA: 02/19/2021 PCP: [place], Betty G, MD   Brief Narrative: The patient is a 71 year old obese African-American female with a past medical history significant for but not limited to hypertension, hyperlipidemia, and other comorbidities who presented with epistaxis.  She reports that she was admitted exactly the same exact scenario 1 year ago and records show that was in [place] from 12 27-4 28.  If she had syncope then going to the bathroom as well as after she had her child 50 years ago.  She reports that she got up yesterday and went to watch TV.  She notes that her nose was dripping which was unusual for her and she went to the bathroom and discovered it was bleeding.  She forgot and stopped and then started again so she came to the ED about 10 AM.  She went to the bathroom had a syncopal episode witnessed by the nursing staff.  She had 2 episodes of coughing up a large amount of blood and clot.  Rhino Rocket was placed and bleeding was stabilized.  A look like she has been using both Astelin and Flonase nasal steroids.  In the ED her epistaxis improved and her blood pressure dropped as she had syncope as a systolic went from 1 [id number].  She had a large episode of coughing up blood and clots which appeared to be stabilized.  Rhino Rocket was in place and ENT was consulted for further evaluation still waiting to see the patient.  Assessment & Plan:   Principal Problem:   Epistaxis not due to trauma Active Problems:   Essential hypertension, benign   Hyperlipemia, mixed   Allergic rhinitis   Class 1 obesity with body mass index (BMI) of 33.0 to 33.9 in adult   Prediabetes   Stage 3a chronic kidney disease (HCC)   Syncope and collapse  Epistaxis not due to trauma- (present on admission) -Patient presenting with epistaxis -She had a similar episode also requiring overnight monitoring 1 year ago -Nasal packing in place and appears  to have stabilized -Will continue to monitor overnight -Large volume bloody sputum/emesis likely related to post-nasal drip -ENT to consult and pending further evaluation -recheck CBC in AM   Syncope and collapse- (present on admission) -Witnessed episode in the ER while transferring to/from bathroom -This is her 3rd lifetime episode, all while hospitalized under similar circumstances -Appears to have been orthostatic/vasovagal, as her BP was significantly lower than prior when checked immediately after the episode -Continue with IV fluid hydration and resumed at 75 MLS per hour; received a 500 mL bolus this morning -Check orthostatic vital signs -Will monitor on telemetry overnight   AKI versus stage 3a chronic kidney disease ([facility])- (present on admission) -Appears to be stable at this time and improving with IV fluid hydration -Patient's BUNs/creatinine went from 30/1.27 and trended down to 22/1.01 -We will consider resumption of her ARB in the morning -Avoid nephrotoxic medications, contrast dyes, hypotension renally dose medications -CMP in a.m.   Prediabetes- (present on admission) -Recent A1c was 6.1 -Does not need monitoring at this time and blood sugars ranging from 113-119 on daily BMP/CMP is   Allergic rhinitis- (present on admission) -Claritin substituted for Allegra due to formulary -She appears to have been using both Astelin and Flonase so I discontinued Flonase; this likely increased her chances for epistaxis   Hyperlipemia, mixed- (present on admission) -Continue pravastatin 20 mg daily   Essential hypertension, benign- (present  on admission) -Resume home Toprol XL -Hold Cozaar given mildly abnormal renal function and apparent orthostatic episode -Continue monitor blood pressures per protocol -Last blood pressure reading was 149/64  Normocytic anemia -Patient's hemoglobin/hematocrit went from 12.3/40.1 and trended up to 13.6/40.0 and is now 10.5/three 2.2 in the  setting of her epistaxis -Her epistaxis seems to be resolved and she has no current signs and symptoms of bleeding -Repeat CBC in a.m. and will check anemia panel   Obesity -Complicates overall prognosis and care -Estimated body mass index is 31.31 kg/m as calculated from the following:   Height as of this encounter: 5\' 6"  (1.676 m).   Weight as of this encounter: 88 kg.  -Weight Loss and Dietary Counseling given -Outpatient PCP/bariatric medicine/bariatric surgery f/u encouraged  Glaucoma -Continue with brimonidine and timolol eyedrops in the left eye  DVT prophylaxis: SCDs given her epistaxis Code Status: FULL CODE Family Communication: Discussed with family at bedside Disposition Plan: Pending improvement in her orthostatics as well as evaluation by ENT for safe discharge disposition  Status is: Observation  The patient will require care spanning > 2 midnights and should be moved to inpatient because: She still awaiting ENT evaluation  Consultants:  ENT  Procedures: None  Antimicrobials:  Anti-infectives (From admission, onward)    None        Subjective: Seen and examined at bedside and states that her dizziness is improving and her bleeding stopped in the hospital denies any chest pain or shortness breath.  PT evaluation.  No other concerns or complaints at this time  Objective: Vitals:   02/20/21 0218 02/20/21 0415 02/20/21 0819 02/20/21 1530  BP: 140/80 (!) 162/83 (!) 189/87 (!) 192/92  Pulse: 91 96 (!) 102 89  Resp: 18 18 19 18   Temp: 98.2 F (36.8 C) 98.7 F (37.1 C) 98.3 F (36.8 C) 98.6 F (37 C)  TempSrc: Oral Oral Oral Oral  SpO2: 100% 100% 100% 99%  Weight:      Height:        Intake/Output Summary (Last 24 hours) at 02/20/2021 1740 Last data filed at 02/20/2021 0820 Gross per 24 hour  Intake 615 ml  Output --  Net 615 ml   Filed Weights   02/19/21 1700  Weight: 88 kg   Examination: Physical Exam:  Constitutional: WN/WD obese  African-American female currently no acute distress Eyes: Lids and conjunctivae normal, sclerae anicteric  ENMT: External Ears, Nose appear normal.  Has a Rhino Rocket in her left nare Neck: Appears normal, supple, no cervical masses, normal ROM, no appreciable thyromegaly and no appreciable JVD Respiratory: Mildly diminished to auscultation bilaterally, no wheezing, rales, rhonchi or crackles. Normal respiratory effort and patient is not tachypenic. No accessory muscle use.  Unlabored breathing Cardiovascular: RRR, no murmurs / rubs / gallops. S1 and S2 auscultated.  No appreciable extremity edema Abdomen: Soft, non-tender, distended secondary body habitus. Bowel sounds positive.  GU: Deferred. Musculoskeletal: No clubbing / cyanosis of digits/nails. No joint deformity upper and lower extremities.  Skin: No rashes, lesions, ulcers on limited skin evaluation. No induration; Warm and dry.  Neurologic: CN 2-12 grossly intact with no focal deficits. Romberg sign and cerebellar reflexes not assessed.  Psychiatric: Normal judgment and insight. Alert and oriented x 3. Normal mood and appropriate affect.   Data Reviewed: I have personally reviewed following labs and imaging studies  CBC: Recent Labs  Lab 02/19/21 1322 02/19/21 1333 02/20/21 0325  WBC 11.0*  --  10.7*  NEUTROABS 7.9*  --   --  HGB 12.3 13.6 10.5*  HCT 40.1 40.0 32.3*  MCV 85.9  --  82.2  PLT 304  --  123456   Basic Metabolic Panel: Recent Labs  Lab 02/19/21 1322 02/19/21 1333 02/20/21 0325  NA 140 142 142  K 3.7 3.9 3.7  CL 108 108 107  CO2 22  --  27  GLUCOSE 119* 117* 113*  BUN 30* 35* 22  CREATININE 1.27* 1.30* 1.01*  CALCIUM 9.0  --  8.8*   GFR: Estimated Creatinine Clearance: 59.6 mL/min (A) (by C-G formula based on SCr of 1.01 mg/dL (H)). Liver Function Tests: Recent Labs  Lab 02/19/21 1322  AST 17  ALT 23  ALKPHOS 67  BILITOT 0.5  PROT 6.9  ALBUMIN 3.2*   No results for input(s): LIPASE, AMYLASE  in the last 168 hours. No results for input(s): AMMONIA in the last 168 hours. Coagulation Profile: No results for input(s): INR, PROTIME in the last 168 hours. Cardiac Enzymes: No results for input(s): CKTOTAL, CKMB, CKMBINDEX, TROPONINI in the last 168 hours. BNP (last 3 results) No results for input(s): PROBNP in the last 8760 hours. HbA1C: No results for input(s): HGBA1C in the last 72 hours. CBG: No results for input(s): GLUCAP in the last 168 hours. Lipid Profile: No results for input(s): CHOL, HDL, LDLCALC, TRIG, CHOLHDL, LDLDIRECT in the last 72 hours. Thyroid Function Tests: No results for input(s): TSH, T4TOTAL, FREET4, T3FREE, THYROIDAB in the last 72 hours. Anemia Panel: No results for input(s): VITAMINB12, FOLATE, FERRITIN, TIBC, IRON, RETICCTPCT in the last 72 hours. Sepsis Labs: No results for input(s): PROCALCITON, LATICACIDVEN in the last 168 hours.  Recent Results (from the past 240 hour(s))  Resp Panel by RT-PCR (Flu A&B, Covid) Nasopharyngeal Swab     Status: None   Collection Time: 02/19/21  1:46 PM   Specimen: Nasopharyngeal Swab; Nasopharyngeal(NP) swabs in vial transport medium  Result Value Ref Range Status   SARS Coronavirus 2 by RT PCR NEGATIVE NEGATIVE Final    Comment: (NOTE) SARS-CoV-2 target nucleic acids are NOT DETECTED.  The SARS-CoV-2 RNA is generally detectable in upper respiratory specimens during the acute phase of infection. The lowest concentration of SARS-CoV-2 viral copies this assay can detect is 138 copies/mL. A negative result does not preclude SARS-Cov-2 infection and should not be used as the sole basis for treatment or other patient management decisions. A negative result may occur with  improper specimen collection/handling, submission of specimen other than nasopharyngeal swab, presence of viral mutation(s) within the areas targeted by this assay, and inadequate number of viral copies(<138 copies/mL). A negative result must be  combined with clinical observations, patient history, and epidemiological information. The expected result is Negative.  Fact Sheet for Patients:  EntrepreneurPulse.com.au  Fact Sheet for Healthcare Providers:  IncredibleEmployment.be  This test is no t yet approved or cleared by the [place] FDA and  has been authorized for detection and/or diagnosis of SARS-CoV-2 by FDA under an Emergency Use Authorization (EUA). This EUA will remain  in effect (meaning this test can be used) for the duration of the COVID-19 declaration under Section 564(b)(1) of the Act, 21 U.S.C.section 360bbb-3(b)(1), unless the authorization is terminated  or revoked sooner.       Influenza A by PCR NEGATIVE NEGATIVE Final   Influenza B by PCR NEGATIVE NEGATIVE Final    Comment: (NOTE) The Xpert Xpress SARS-CoV-2/FLU/RSV plus assay is intended as an aid in the diagnosis of influenza from Nasopharyngeal swab specimens and should not be  used as a sole basis for treatment. Nasal washings and aspirates are unacceptable for Xpert Xpress SARS-CoV-2/FLU/RSV testing.  Fact Sheet for Patients: EntrepreneurPulse.com.au  Fact Sheet for Healthcare Providers: IncredibleEmployment.be  This test is not yet approved or cleared by the [place] FDA and has been authorized for detection and/or diagnosis of SARS-CoV-2 by FDA under an Emergency Use Authorization (EUA). This EUA will remain in effect (meaning this test can be used) for the duration of the COVID-19 declaration under Section 564(b)(1) of the Act, 21 U.S.C. section 360bbb-3(b)(1), unless the authorization is terminated or revoked.  Performed at [facility], [place] [address] [place], [place] [address]     RN Pressure Injury Documentation:     Estimated body mass index is 31.31 kg/m as calculated from the following:   Height as of this encounter: 5\' 6"  (1.676 m).    Weight as of this encounter: 88 kg.  Malnutrition Type:   Malnutrition Characteristics:   Nutrition Interventions:   Radiology Studies: No results found.  Scheduled Meds:  brimonidine  1 drop Left Eye BID   And   timolol  1 drop Left Eye BID   docusate sodium  100 mg Oral BID   latanoprost  1 drop Both Eyes QHS   loratadine  10 mg Oral Daily   metoprolol succinate  100 mg Oral Daily   pravastatin  20 mg Oral Daily   sodium chloride flush  3 mL Intravenous Q12H   Continuous Infusions:   LOS: 0 days   Kerney Elbe, DO [facility] PAGER is on AMION  If 7PM-7AM, please contact night-coverage [email or web address]

## 2021-02-21 DIAGNOSIS — Z880 Allergy status to penicillin: Secondary | ICD-10-CM | POA: Diagnosis not present

## 2021-02-21 DIAGNOSIS — D62 Acute posthemorrhagic anemia: Secondary | ICD-10-CM | POA: Diagnosis present

## 2021-02-21 DIAGNOSIS — H409 Unspecified glaucoma: Secondary | ICD-10-CM | POA: Diagnosis present

## 2021-02-21 DIAGNOSIS — N1831 Chronic kidney disease, stage 3a: Secondary | ICD-10-CM | POA: Diagnosis present

## 2021-02-21 DIAGNOSIS — E669 Obesity, unspecified: Secondary | ICD-10-CM | POA: Diagnosis present

## 2021-02-21 DIAGNOSIS — Z6833 Body mass index (BMI) 33.0-33.9, adult: Secondary | ICD-10-CM | POA: Diagnosis not present

## 2021-02-21 DIAGNOSIS — Z833 Family history of diabetes mellitus: Secondary | ICD-10-CM | POA: Diagnosis not present

## 2021-02-21 DIAGNOSIS — R7303 Prediabetes: Secondary | ICD-10-CM | POA: Diagnosis present

## 2021-02-21 DIAGNOSIS — R55 Syncope and collapse: Secondary | ICD-10-CM | POA: Diagnosis not present

## 2021-02-21 DIAGNOSIS — Z79899 Other long term (current) drug therapy: Secondary | ICD-10-CM | POA: Diagnosis not present

## 2021-02-21 DIAGNOSIS — Z7982 Long term (current) use of aspirin: Secondary | ICD-10-CM | POA: Diagnosis not present

## 2021-02-21 DIAGNOSIS — Z8249 Family history of ischemic heart disease and other diseases of the circulatory system: Secondary | ICD-10-CM | POA: Diagnosis not present

## 2021-02-21 DIAGNOSIS — E876 Hypokalemia: Secondary | ICD-10-CM | POA: Diagnosis not present

## 2021-02-21 DIAGNOSIS — E782 Mixed hyperlipidemia: Secondary | ICD-10-CM | POA: Diagnosis present

## 2021-02-21 DIAGNOSIS — R040 Epistaxis: Secondary | ICD-10-CM | POA: Diagnosis present

## 2021-02-21 DIAGNOSIS — Z20822 Contact with and (suspected) exposure to covid-19: Secondary | ICD-10-CM | POA: Diagnosis present

## 2021-02-21 DIAGNOSIS — J309 Allergic rhinitis, unspecified: Secondary | ICD-10-CM | POA: Diagnosis present

## 2021-02-21 DIAGNOSIS — I10 Essential (primary) hypertension: Secondary | ICD-10-CM | POA: Diagnosis not present

## 2021-02-21 DIAGNOSIS — I129 Hypertensive chronic kidney disease with stage 1 through stage 4 chronic kidney disease, or unspecified chronic kidney disease: Secondary | ICD-10-CM | POA: Diagnosis present

## 2021-02-21 LAB — CBC WITH DIFFERENTIAL/PLATELET
Abs Immature Granulocytes: 0.02 10*3/uL (ref 0.00–0.07)
Basophils Absolute: 0 10*3/uL (ref 0.0–0.1)
Basophils Relative: 0 %
Eosinophils Absolute: 0 10*3/uL (ref 0.0–0.5)
Eosinophils Relative: 0 %
HCT: 28.2 % — ABNORMAL LOW (ref 36.0–46.0)
Hemoglobin: 8.9 g/dL — ABNORMAL LOW (ref 12.0–15.0)
Immature Granulocytes: 0 %
Lymphocytes Relative: 28 %
Lymphs Abs: 2.5 10*3/uL (ref 0.7–4.0)
MCH: 26.3 pg (ref 26.0–34.0)
MCHC: 31.6 g/dL (ref 30.0–36.0)
MCV: 83.4 fL (ref 80.0–100.0)
Monocytes Absolute: 0.6 10*3/uL (ref 0.1–1.0)
Monocytes Relative: 7 %
Neutro Abs: 5.8 10*3/uL (ref 1.7–7.7)
Neutrophils Relative %: 65 %
Platelets: 254 10*3/uL (ref 150–400)
RBC: 3.38 MIL/uL — ABNORMAL LOW (ref 3.87–5.11)
RDW: 16.9 % — ABNORMAL HIGH (ref 11.5–15.5)
WBC: 8.9 10*3/uL (ref 4.0–10.5)
nRBC: 0 % (ref 0.0–0.2)

## 2021-02-21 LAB — COMPREHENSIVE METABOLIC PANEL
ALT: 19 U/L (ref 0–44)
AST: 18 U/L (ref 15–41)
Albumin: 3 g/dL — ABNORMAL LOW (ref 3.5–5.0)
Alkaline Phosphatase: 54 U/L (ref 38–126)
Anion gap: 10 (ref 5–15)
BUN: 9 mg/dL (ref 8–23)
CO2: 23 mmol/L (ref 22–32)
Calcium: 8.5 mg/dL — ABNORMAL LOW (ref 8.9–10.3)
Chloride: 109 mmol/L (ref 98–111)
Creatinine, Ser: 1.02 mg/dL — ABNORMAL HIGH (ref 0.44–1.00)
GFR, Estimated: 60 mL/min — ABNORMAL LOW (ref >60)
Glucose, Bld: 102 mg/dL — ABNORMAL HIGH (ref 70–99)
Potassium: 3.3 mmol/L — ABNORMAL LOW (ref 3.5–5.1)
Sodium: 142 mmol/L (ref 135–145)
Total Bilirubin: 0.6 mg/dL (ref 0.3–1.2)
Total Protein: 6.1 g/dL — ABNORMAL LOW (ref 6.5–8.1)

## 2021-02-21 LAB — PHOSPHORUS: Phosphorus: 3 mg/dL (ref 2.5–4.6)

## 2021-02-21 LAB — TSH: TSH: 1.682 u[IU]/mL (ref 0.350–4.500)

## 2021-02-21 LAB — MAGNESIUM: Magnesium: 2 mg/dL (ref 1.7–2.4)

## 2021-02-21 MED ORDER — POLYETHYLENE GLYCOL 3350 17 G PO PACK: 17.0000 g | PACK | Freq: Every day | ORAL | 0 refills | Status: DC | PRN

## 2021-02-21 MED ORDER — DOCUSATE SODIUM 100 MG PO CAPS: 100.0000 mg | ORAL_CAPSULE | Freq: Two times a day (BID) | ORAL | 0 refills | Status: DC

## 2021-02-21 MED ORDER — ASPIRIN 81 MG PO TABS: 81.0000 mg | ORAL_TABLET | Freq: Every day | ORAL | Status: DC

## 2021-02-21 MED ORDER — POTASSIUM CHLORIDE CRYS ER 20 MEQ PO TBCR
40.0000 meq | EXTENDED_RELEASE_TABLET | Freq: Two times a day (BID) | ORAL | Status: DC
  Administered 2021-02-21: 40 meq via ORAL
  Filled 2021-02-21: qty 2

## 2021-02-21 NOTE — Plan of Care (Signed)
  Problem: Education: Goal: Knowledge of General Education information will improve Description: Including pain rating scale, medication(s)/side effects and non-pharmacologic comfort measures Outcome: Adequate for Discharge   

## 2021-02-21 NOTE — Progress Notes (Signed)
Discharge instructions (including medications) discussed with and copy provided to patient/caregiver 

## 2021-02-21 NOTE — Discharge Summary (Signed)
Physician Discharge Summary  Meghan Nichols [id number] DOB: [birth year] DOA: 02/19/2021  PCP: Martinique, Betty G, MD  Admit date: 02/19/2021 Discharge date: 02/21/2021  Admitted From: Home Disposition:  Home  Recommendations for Outpatient Follow-up:  Follow up with PCP in 1-2 weeks Follow-up with ENT on 01/23/2022 or 01/24/2022 for Rhino Rocket extraction Please obtain CMP/CBC, Mag, Phos in one week Please follow up on the following pending results:  Home Health: No  Equipment/Devices: None    Discharge Condition: Stable  CODE STATUS: FULL CODE  Diet recommendation: Heart Healthy Diet  Brief/Interim Summary: The patient is a 71 year old obese African-American female with a past medical history significant for but not limited to hypertension, hyperlipidemia, and other comorbidities who presented with epistaxis.  She reports that she was admitted exactly the same exact scenario 1 year ago and records show that was in [place] from 12 27-4 28.  If she had syncope then going to the bathroom as well as after she had her child 50 years ago.  She reports that she got up yesterday and went to watch TV.  She notes that her nose was dripping which was unusual for her and she went to the bathroom and discovered it was bleeding.  She forgot and stopped and then started again so she came to the ED about 10 AM.  She went to the bathroom had a syncopal episode witnessed by the nursing staff.  She had 2 episodes of coughing up a large amount of blood and clot.  Rhino Rocket was placed and bleeding was stabilized.  A look like she has been using both Astelin and Flonase nasal steroids.  In the ED her epistaxis improved and her blood pressure dropped as she had syncope as a systolic went from 1 [id number].  She had a large episode of coughing up blood and clots which appeared to be stabilized.  Rhino Rocket was in place and ENT was consulted for further evaluation still waiting to see the patient and I spoke with Dr.  Constance Holster who feels that he does not need to see the patient inpatient and recommends discharging the patient home given that she is medically stable and having her follow-up with him in the clinic for Rhino Rocket extraction on 01/23/2022 or 01/24/2022.  She was the medically stable to be discharged and had no more vasovagal episodes or syncope and she was not orthostatic prior to discharge.  She ambulated without issues and was deemed medically stable for discharge at this time  Discharge Diagnoses:  Principal Problem:   Epistaxis not due to trauma Active Problems:   Essential hypertension, benign   Hyperlipemia, mixed   Allergic rhinitis   Class 1 obesity with body mass index (BMI) of 33.0 to 33.9 in adult   Prediabetes   Stage 3a chronic kidney disease (HCC)   Syncope and collapse   Epistaxis  Epistaxis not due to trauma- (present on admission) -Patient presenting with epistaxis -She had a similar episode also requiring overnight monitoring 1 year ago -Nasal packing in place and appears to have stabilized -Will continue to monitor overnight -Large volume bloody sputum/emesis likely related to post-nasal drip -ENT to consult and pending further evaluation and I spoke with Dr. Constance Holster who was peripherally following the chart and states that he does not need to see the patient inpatient and recommends outpatient follow-up with him in 2 to 3 days -Repeat hemoglobin showed that the patient's hemoglobin dropped to 8.9 but she is stable and has no active bleeding  so we will follow-up with PCP and repeat in outpatient setting   Syncope and collapse- (present on admission) -Witnessed episode in the ER while transferring to/from bathroom -This is her 3rd lifetime episode, all while hospitalized under similar circumstances -Appears to have been orthostatic/vasovagal, as her BP was significantly lower than prior when checked immediately after the episode -Continue with IV fluid hydration and resumed at  75 MLS per hour; received a 500 mL bolus yesterday morning; -Check orthostatic vital signs and she is not orthostatic -Will monitor on telemetry overnight -She ambulated without issues and she was deemed stable for discharge   AKI versus stage 3a chronic kidney disease (HCC)- (present on admission) -Appears to be stable at this time and improving with IV fluid hydration -Patient's BUNs/creatinine went from 30/1.27 and trended down to 22/1.01 and today was 9/1.02 -We will consider resumption of her ARB in the morning and follow-up at discharge -Avoid nephrotoxic medications, contrast dyes, hypotension renally dose medications -Repeat CMP within 1 week  Hypokalemia -Patient's K+ was 3.3 -Replete prior to discharge -Repeat CMP within 1 week   Prediabetes- (present on admission) -Recent A1c was 6.1 -Does not need monitoring at this time and blood sugars ranging from 113-119 on daily BMP/CMP is   Allergic rhinitis- (present on admission) -Claritin substituted for Allegra due to formulary -She appears to have been using both Astelin and Flonase so I discontinued Flonase; this likely increased her chances for epistaxis and she will need to follow-up with her ENT as an outpatient   Hyperlipemia, mixed- (present on admission) -Continue pravastatin 20 mg daily   Essential hypertension, benign- (present on admission) -Resume home Toprol XL -Hold Cozaar given mildly abnormal renal function and apparent orthostatic episode -Continue monitor blood pressures per protocol -Last blood pressure reading was 178/82   Normocytic anemia -Patient's hemoglobin/hematocrit went from 12.3/40.1 and trended up to 13.6/40.0 and is now 10.5/32.3 and then trended down to 8.9/28.2 but likely dilutional drop because she continued to get IV fluids -Her epistaxis seems to be resolved and she has no current signs and symptoms of bleeding -Repeat CBC within 1 week. and will check anemia panel in outpatient setting    Obesity -Complicates overall prognosis and care -Estimated body mass index is 31.31 kg/m as calculated from the following:   Height as of this encounter: 5\' 6"  (1.676 m).   Weight as of this encounter: 88 kg.  -Weight Loss and Dietary Counseling given -Outpatient PCP/bariatric medicine/bariatric surgery f/u encouraged   Glaucoma -Continue with brimonidine and timolol eyedrops in the left eye  Discharge Instructions  Discharge Instructions     Call MD for:  difficulty breathing, headache or visual disturbances   Complete by: As directed    Call MD for:  extreme fatigue   Complete by: As directed    Call MD for:  hives   Complete by: As directed    Call MD for:  persistant dizziness or light-headedness   Complete by: As directed    Call MD for:  persistant nausea and vomiting   Complete by: As directed    Call MD for:  redness, tenderness, or signs of infection (pain, swelling, redness, odor or green/yellow discharge around incision site)   Complete by: As directed    Call MD for:  severe uncontrolled pain   Complete by: As directed    Call MD for:  temperature >100.4   Complete by: As directed    Diet - low sodium heart healthy   Complete  by: As directed    Discharge instructions   Complete by: As directed    You were cared for by a hospitalist during your hospital stay. If you have any questions about your discharge medications or the care you received while you were in the hospital after you are discharged, you can call the unit and ask to speak with the hospitalist on call if the hospitalist that took care of you is not available. Once you are discharged, your primary care physician will handle any further medical issues. Please note that NO REFILLS for any discharge medications will be authorized once you are discharged, as it is imperative that you return to your primary care physician (or establish a relationship with a primary care physician if you do not have one) for  your aftercare needs so that they can reassess your need for medications and monitor your lab values.  Follow up with PCP and ENT within 1-2 weeks. Take all medications as prescribed. If symptoms change or worsen please return to the ED for evaluation   Increase activity slowly   Complete by: As directed       Allergies as of 02/21/2021       Reactions   Penicillins Hives   Has patient had a PCN reaction causing immediate rash, facial/tongue/throat swelling, SOB or lightheadedness with hypotension: Yes Has patient had a PCN reaction causing severe rash involving mucus membranes or skin necrosis: Unknown Has patient had a PCN reaction that required hospitalization: No Has patient had a PCN reaction occurring within the last 10 years: No If all of the above answers are "NO", then may proceed with Cephalosporin use.        Medication List     TAKE these medications    aspirin 81 MG tablet Take 1 tablet (81 mg total) by mouth daily. Start taking on: February 24, 2021 What changed: These instructions start on February 24, 2021. If you are unsure what to do until then, ask your doctor or other care provider.   azelastine 0.1 % nasal spray Commonly known as: Astelin Place 2 sprays into both nostrils 2 (two) times daily. Use in each nostril as directed   brimonidine-timolol 0.2-0.5 % ophthalmic solution Commonly known as: COMBIGAN Place 1 drop into the left eye 2 (two) times daily.   cholecalciferol 1000 units tablet Commonly known as: VITAMIN D Take 1,000 Units by mouth daily.   docusate sodium 100 MG capsule Commonly known as: COLACE Take 1 capsule (100 mg total) by mouth 2 (two) times daily.   fexofenadine 180 MG tablet Commonly known as: ALLEGRA TAKE 1 TABLET BY MOUTH EVERY DAY   losartan 50 MG tablet Commonly known as: COZAAR Take 1 tablet (50 mg total) by mouth daily.   metoprolol succinate 100 MG 24 hr tablet Commonly known as: TOPROL-XL TAKE 1 TABLET EVERY DAY WITH  A MEAL What changed:  how much to take when to take this additional instructions   polyethylene glycol 17 g packet Commonly known as: MIRALAX / GLYCOLAX Take 17 g by mouth daily as needed for mild constipation.   potassium chloride SA 20 MEQ tablet Commonly known as: Klor-Con M20 Take 1 tablet (20 mEq total) by mouth daily.   pravastatin 20 MG tablet Commonly known as: PRAVACHOL TAKE 1 TABLET BY MOUTH EVERY DAY   spironolactone 25 MG tablet Commonly known as: ALDACTONE TAKE 1 TABLET (25 MG TOTAL) BY MOUTH DAILY.   traMADol 50 MG tablet Commonly known as: ULTRAM Take  1 tablet (50 mg total) by mouth daily as needed. What changed: reasons to take this   vitamin C 100 MG tablet Take 100 mg by mouth daily.   VITAMIN E PO Take 1 tablet by mouth daily.   Vyzulta 0.024 % Soln Generic drug: Latanoprostene Bunod Place 1 drop into both eyes daily.        Follow-up Information     Martinique, Betty G, MD. Call.   Specialty: Family Medicine Why: Follow up within 1-2 weeks Contact information: [place] [address] [phone]         Izora Gala, MD. Go in 2 day(s).   Specialty: Otolaryngology Why: Follow up with Dr. Constance Holster on 01/23/22 or 01/24/22 Contact information: [address] [place] [address] [phone]                Allergies  Allergen Reactions   Penicillins Hives    Has patient had a PCN reaction causing immediate rash, facial/tongue/throat swelling, SOB or lightheadedness with hypotension: Yes Has patient had a PCN reaction causing severe rash involving mucus membranes or skin necrosis: Unknown Has patient had a PCN reaction that required hospitalization: No Has patient had a PCN reaction occurring within the last 10 years: No If all of the above answers are "NO", then may proceed with Cephalosporin use.    Consultations: Discussed with ENT Dr. Constance Holster   Procedures/Studies: No results  found.   Subjective: Seen and examined at bedside and she is doing well and ambulated in the room without issues.  Had no more episodes of syncope or lightheadedness or dizziness.  Eating has ceased her blood count did drop however stable likely dilutional.  Case was discussed with ENT extensively who recommends continuing the Rhino Rocket at discharge and having the patient follow-up in clinic as Dr. Constance Holster is not coming to see the patient inpatient at this time.  Discharge Exam: Vitals:   02/21/21 0329 02/21/21 0852  BP: (!) 163/81 (!) 178/82  Pulse: 96 93  Resp: 18 17  Temp: 98.2 F (36.8 C) 98 F (36.7 C)  SpO2: 100% 97%   Vitals:   02/20/21 2336 02/21/21 0329 02/21/21 0332 02/21/21 0852  BP: (!) 163/83 (!) 163/81  (!) 178/82  Pulse: 95 96  93  Resp: 18 18  17   Temp: 98.1 F (36.7 C) 98.2 F (36.8 C)  98 F (36.7 C)  TempSrc: Oral Oral  Oral  SpO2: 100% 100%  97%  Weight:   91.1 kg   Height:       General: Pt is alert, awake, not in acute distress; has a Rhino Rocket in the left nare Cardiovascular: RRR, S1/S2 +, no rubs, no gallops Respiratory: CTA bilaterally, no wheezing, no rhonchi Abdominal: Soft, NT, distended secondary body habitus, bowel sounds + Extremities: no edema, no cyanosis  The results of significant diagnostics from this hospitalization (including imaging, microbiology, ancillary and laboratory) are listed below for reference.    Microbiology: Recent Results (from the past 240 hour(s))  Resp Panel by RT-PCR (Flu A&B, Covid) Nasopharyngeal Swab     Status: None   Collection Time: 02/19/21  1:46 PM   Specimen: Nasopharyngeal Swab; Nasopharyngeal(NP) swabs in vial transport medium  Result Value Ref Range Status   SARS Coronavirus 2 by RT PCR NEGATIVE NEGATIVE Final    Comment: (NOTE) SARS-CoV-2 target nucleic acids are NOT DETECTED.  The SARS-CoV-2 RNA is generally detectable in upper respiratory specimens during the acute phase  of infection. The  lowest concentration of SARS-CoV-2 viral copies this assay can detect is 138 copies/mL. A negative result does not preclude SARS-Cov-2 infection and should not be used as the sole basis for treatment or other patient management decisions. A negative result may occur with  improper specimen collection/handling, submission of specimen other than nasopharyngeal swab, presence of viral mutation(s) within the areas targeted by this assay, and inadequate number of viral copies(<138 copies/mL). A negative result must be combined with clinical observations, patient history, and epidemiological information. The expected result is Negative.  Fact Sheet for Patients:  EntrepreneurPulse.com.au  Fact Sheet for Healthcare Providers:  IncredibleEmployment.be  This test is no t yet approved or cleared by the [place] FDA and  has been authorized for detection and/or diagnosis of SARS-CoV-2 by FDA under an Emergency Use Authorization (EUA). This EUA will remain  in effect (meaning this test can be used) for the duration of the COVID-19 declaration under Section 564(b)(1) of the Act, 21 U.S.C.section 360bbb-3(b)(1), unless the authorization is terminated  or revoked sooner.       Influenza A by PCR NEGATIVE NEGATIVE Final   Influenza B by PCR NEGATIVE NEGATIVE Final    Comment: (NOTE) The Xpert Xpress SARS-CoV-2/FLU/RSV plus assay is intended as an aid in the diagnosis of influenza from Nasopharyngeal swab specimens and should not be used as a sole basis for treatment. Nasal washings and aspirates are unacceptable for Xpert Xpress SARS-CoV-2/FLU/RSV testing.  Fact Sheet for Patients: EntrepreneurPulse.com.au  Fact Sheet for Healthcare Providers: IncredibleEmployment.be  This test is not yet approved or cleared by the [place] FDA and has been authorized for detection and/or diagnosis of SARS-CoV-2 by FDA under  an Emergency Use Authorization (EUA). This EUA will remain in effect (meaning this test can be used) for the duration of the COVID-19 declaration under Section 564(b)(1) of the Act, 21 U.S.C. section 360bbb-3(b)(1), unless the authorization is terminated or revoked.  Performed at [facility], [place] [address], [place], [place] [address]     Labs: BNP (last 3 results) No results for input(s): BNP in the last 8760 hours. Basic Metabolic Panel: Recent Labs  Lab 02/19/21 1322 02/19/21 1333 02/20/21 0325 02/21/21 0408  NA 140 142 142 142  K 3.7 3.9 3.7 3.3*  CL 108 108 107 109  CO2 22  --  27 23  GLUCOSE 119* 117* 113* 102*  BUN 30* 35* 22 9  CREATININE 1.27* 1.30* 1.01* 1.02*  CALCIUM 9.0  --  8.8* 8.5*  MG  --   --   --  2.0  PHOS  --   --   --  3.0   Liver Function Tests: Recent Labs  Lab 02/19/21 1322 02/21/21 0408  AST 17 18  ALT 23 19  ALKPHOS 67 54  BILITOT 0.5 0.6  PROT 6.9 6.1*  ALBUMIN 3.2* 3.0*   No results for input(s): LIPASE, AMYLASE in the last 168 hours. No results for input(s): AMMONIA in the last 168 hours. CBC: Recent Labs  Lab 02/19/21 1322 02/19/21 1333 02/20/21 0325 02/21/21 0408  WBC 11.0*  --  10.7* 8.9  NEUTROABS 7.9*  --   --  5.8  HGB 12.3 13.6 10.5* 8.9*  HCT 40.1 40.0 32.3* 28.2*  MCV 85.9  --  82.2 83.4  PLT 304  --  274 254   Cardiac Enzymes: No results for input(s): CKTOTAL, CKMB, CKMBINDEX, TROPONINI in the last 168 hours. BNP: Invalid input(s): POCBNP CBG: No results  for input(s): GLUCAP in the last 168 hours. D-Dimer No results for input(s): DDIMER in the last 72 hours. Hgb A1c No results for input(s): HGBA1C in the last 72 hours. Lipid Profile No results for input(s): CHOL, HDL, LDLCALC, TRIG, CHOLHDL, LDLDIRECT in the last 72 hours. Thyroid function studies Recent Labs    02/21/21 0408  TSH 1.682   Anemia work up No results for input(s): VITAMINB12, FOLATE, FERRITIN, TIBC, IRON, RETICCTPCT in the last  72 hours. Urinalysis    Component Value Date/Time   COLORURINE YELLOW 08/06/2007 1245   APPEARANCEUR CLEAR 08/06/2007 1245   LABSPEC 1.005 08/06/2007 1245   PHURINE 6.0 08/06/2007 1245   GLUCOSEU NEGATIVE 08/06/2007 1245   HGBUR NEGATIVE 08/06/2007 1245   [place] 08/06/2007 1245   KETONESUR NEGATIVE 08/06/2007 1245   PROTEINUR NEGATIVE 08/06/2007 1245   UROBILINOGEN 0.2 08/06/2007 1245   NITRITE NEGATIVE 08/06/2007 1245   LEUKOCYTESUR TRACE (A) 08/06/2007 1245   Sepsis Labs Invalid input(s): PROCALCITONIN,  WBC,  LACTICIDVEN Microbiology Recent Results (from the past 240 hour(s))  Resp Panel by RT-PCR (Flu A&B, Covid) Nasopharyngeal Swab     Status: None   Collection Time: 02/19/21  1:46 PM   Specimen: Nasopharyngeal Swab; Nasopharyngeal(NP) swabs in vial transport medium  Result Value Ref Range Status   SARS Coronavirus 2 by RT PCR NEGATIVE NEGATIVE Final    Comment: (NOTE) SARS-CoV-2 target nucleic acids are NOT DETECTED.  The SARS-CoV-2 RNA is generally detectable in upper respiratory specimens during the acute phase of infection. The lowest concentration of SARS-CoV-2 viral copies this assay can detect is 138 copies/mL. A negative result does not preclude SARS-Cov-2 infection and should not be used as the sole basis for treatment or other patient management decisions. A negative result may occur with  improper specimen collection/handling, submission of specimen other than nasopharyngeal swab, presence of viral mutation(s) within the areas targeted by this assay, and inadequate number of viral copies(<138 copies/mL). A negative result must be combined with clinical observations, patient history, and epidemiological information. The expected result is Negative.  Fact Sheet for Patients:  EntrepreneurPulse.com.au  Fact Sheet for Healthcare Providers:  IncredibleEmployment.be  This test is no t yet approved or cleared by  the [place] FDA and  has been authorized for detection and/or diagnosis of SARS-CoV-2 by FDA under an Emergency Use Authorization (EUA). This EUA will remain  in effect (meaning this test can be used) for the duration of the COVID-19 declaration under Section 564(b)(1) of the Act, 21 U.S.C.section 360bbb-3(b)(1), unless the authorization is terminated  or revoked sooner.       Influenza A by PCR NEGATIVE NEGATIVE Final   Influenza B by PCR NEGATIVE NEGATIVE Final    Comment: (NOTE) The Xpert Xpress SARS-CoV-2/FLU/RSV plus assay is intended as an aid in the diagnosis of influenza from Nasopharyngeal swab specimens and should not be used as a sole basis for treatment. Nasal washings and aspirates are unacceptable for Xpert Xpress SARS-CoV-2/FLU/RSV testing.  Fact Sheet for Patients: EntrepreneurPulse.com.au  Fact Sheet for Healthcare Providers: IncredibleEmployment.be  This test is not yet approved or cleared by the [place] FDA and has been authorized for detection and/or diagnosis of SARS-CoV-2 by FDA under an Emergency Use Authorization (EUA). This EUA will remain in effect (meaning this test can be used) for the duration of the COVID-19 declaration under Section 564(b)(1) of the Act, 21 U.S.C. section 360bbb-3(b)(1), unless the authorization is terminated or revoked.  Performed at [facility], [address] [place], [place] [address]    Time coordinating discharge: 35 minutes  SIGNED:  Kerney Elbe, DO [facility] 02/21/2021, 6:50 PM Pager is on [place]  If 7PM-7AM, please contact night-coverage [email or web address]

## 2021-02-22 ENCOUNTER — Telehealth: Payer: Self-pay

## 2021-02-22 NOTE — Telephone Encounter (Signed)
Transition Care Management Follow-up Telephone Call Date of discharge and from where: [facility] 02-21-21 How have you been since you were released from the hospital? better Any questions or concerns? No  Items Reviewed: Did the pt receive and understand the discharge instructions provided? Yes  Medications obtained and verified? Yes  Other? No  Any new allergies since your discharge? No  Dietary orders reviewed? Yes Do you have support at home? Yes   Home Care and Equipment/Supplies: Were home health services ordered? no If so, what is the name of the agency? na  Has the agency set up a time to come to the patient's home? not applicable Were any new equipment or medical supplies ordered?  No What is the name of the medical supply agency? na Were you able to get the supplies/equipment? no Do you have any questions related to the use of the equipment or supplies? No  Functional Questionnaire: (I = Independent and D = Dependent) ADLs: I  Bathing/Dressing- I  Meal Prep- I  Eating- I  Maintaining continence- I  Transferring/Ambulation- I  Managing Meds- I  Follow up appointments reviewed:  PCP Hospital f/u appt confirmed? Yes  Scheduled to see Dr [place] [facility] No - pt has called ENT to make fu appt- waiting to hear back  Are transportation arrangements needed? No  If their condition worsens, is the pt aware to call PCP or go to the Emergency Dept.? Yes Was the patient provided with contact information for the PCP's office or ED? Yes Was to pt encouraged to call back with questions or concerns? Yes

## 2021-02-24 DIAGNOSIS — J343 Hypertrophy of nasal turbinates: Secondary | ICD-10-CM | POA: Diagnosis not present

## 2021-02-24 DIAGNOSIS — R040 Epistaxis: Secondary | ICD-10-CM | POA: Diagnosis not present

## 2021-02-24 DIAGNOSIS — J342 Deviated nasal septum: Secondary | ICD-10-CM | POA: Diagnosis not present

## 2021-02-24 LAB — TYPE AND SCREEN
ABO/RH(D): B POS
Antibody Screen: POSITIVE
Unit division: 0
Unit division: 0

## 2021-02-24 LAB — BPAM RBC
Blood Product Expiration Date: 202301172359
Blood Product Expiration Date: 202301172359
Unit Type and Rh: 7300
Unit Type and Rh: 7300

## 2021-02-27 NOTE — Progress Notes (Signed)
HPI: [name] is a 71 y.o. female, who is here today to follow on recent hospitalization. She was hospitalized on 02/19/21 and discharged on 02/21/21. TCM call on 02/22/21. She presented to the ER with epistaxis. ENT consulted. Nose bleeding was treated with Rhino Rocket placement. She went to the bathroom while in the ED and had episode of syncope witnessed by nursing staff. She has had 3 lifetime episodes of syncope, usually proceeded by headache.Last episode of headache before going to the ER on 02/19/21. IV hydration during hospitalization. Not orthostatic at the time of discharge. She stopped Aspirin 81 mg daily.  She has had similar episodes of epistaxis in the past, last one about a year ago. She has not identified exacerbating or alleviating factors. Allergic rhinitis on Flonase and Astelin, most likely contributing factors, Flonase was discontinued. Friday she saw ENT and had Rhino Rocket removed. She has not had any episode of nose bleed since discharge.  States that her BP at home is usually < 140/90 unless she is in pain, BP is high when she is having back pain. Denies visual changes, chest pain, dyspnea, palpitation, claudication, focal weakness, or worsening edema. She is on Spironolactone 25 mg daily, Losartan 50 mg daily,and Metoprolol succinate 100 mg daily.  HypoK+ has improved since Spironolactone was added. She is also on KLOR 20 meq daily.  Mg and phosphorus were normal during hospitalization. She is not on Mg supplementation. CKD III: She has not noted gross hematuria, foam in urine,or decreased urine output. During hospitalization Losartan was held die to worsening renal function, attributed to dehydration.  Lab Results  Component Value Date   CREATININE 1.02 (H) 02/21/2021   BUN 9 02/21/2021   NA 142 02/21/2021   K 3.3 (L) 02/21/2021   CL 109 02/21/2021   CO2 23 02/21/2021   Lab Results  Component Value Date   ALT 19 02/21/2021   AST 18 02/21/2021    ALKPHOS 54 02/21/2021   BILITOT 0.6 02/21/2021   Lab Results  Component Value Date   WBC 8.9 02/21/2021   HGB 8.9 (L) 02/21/2021   HCT 28.2 (L) 02/21/2021   MCV 83.4 02/21/2021   PLT 254 02/21/2021   Lower back pain, chronic. Exacerbated by prolonged walking and standing. Alleviated by changing positions and rest. She takes Tylenol and Tramadol 50 mg daily prn. She has tolerated medication well, no side effects.  She has not taken tramadol since 01/2020. Pain is sometimes radiated to the lower extremities,L>R. Negative for saddle anesthesia or bowel/bladder dysfunction.  Review of Systems  Constitutional:  Negative for activity change, appetite change, fatigue and fever.  HENT:  Negative for mouth sores, sore throat and trouble swallowing.   Respiratory:  Negative for cough and wheezing.   Gastrointestinal:  Negative for abdominal pain, nausea and vomiting.       Negative for changes in bowel habits.  Musculoskeletal:  Positive for back pain and gait problem.  Skin:  Negative for pallor and rash.  Allergic/Immunologic: Positive for environmental allergies.  Neurological:  Negative for syncope and facial asymmetry.  Hematological:  Negative for adenopathy. Does not bruise/bleed easily.  Rest see pertinent positives and negatives per HPI.  Current Outpatient Medications on File Prior to Visit  Medication Sig Dispense Refill   Ascorbic Acid (VITAMIN C) 100 MG tablet Take 100 mg by mouth daily.     aspirin 81 MG tablet Take 1 tablet (81 mg total) by mouth daily. 30 tablet    azelastine (ASTELIN) 0.1 %  nasal spray Place 2 sprays into both nostrils 2 (two) times daily. Use in each nostril as directed 30 mL 3   brimonidine-timolol (COMBIGAN) 0.2-0.5 % ophthalmic solution Place 1 drop into the left eye 2 (two) times daily.     cholecalciferol (VITAMIN D) 1000 UNITS tablet Take 1,000 Units by mouth daily.     fexofenadine (ALLEGRA) 180 MG tablet TAKE 1 TABLET BY MOUTH EVERY DAY  (Patient taking differently: Take 180 mg by mouth daily.) 90 tablet 3   losartan (COZAAR) 50 MG tablet Take 1 tablet (50 mg total) by mouth daily. 90 tablet 3   metoprolol succinate (TOPROL-XL) 100 MG 24 hr tablet TAKE 1 TABLET EVERY DAY WITH A MEAL (Patient taking differently: 100 mg daily.) 90 tablet 2   potassium chloride SA (KLOR-CON M20) 20 MEQ tablet Take 1 tablet (20 mEq total) by mouth daily. 180 tablet 0   pravastatin (PRAVACHOL) 20 MG tablet TAKE 1 TABLET BY MOUTH EVERY DAY (Patient taking differently: Take 20 mg by mouth daily.) 90 tablet 3   spironolactone (ALDACTONE) 25 MG tablet TAKE 1 TABLET (25 MG TOTAL) BY MOUTH DAILY. 90 tablet 3   VITAMIN E PO Take 1 tablet by mouth daily.     VYZULTA 0.024 % SOLN Place 1 drop into both eyes daily.     docusate sodium (COLACE) 100 MG capsule Take 1 capsule (100 mg total) by mouth 2 (two) times daily. (Patient not taking: Reported on 02/22/2021) 10 capsule 0   polyethylene glycol (MIRALAX / GLYCOLAX) 17 g packet Take 17 g by mouth daily as needed for mild constipation. (Patient not taking: Reported on 02/22/2021) 14 each 0   No current facility-administered medications on file prior to visit.    Past Medical History:  Diagnosis Date   High cholesterol    Hypertension    Allergies  Allergen Reactions   Penicillins Hives    Has patient had a PCN reaction causing immediate rash, facial/tongue/throat swelling, SOB or lightheadedness with hypotension: Yes Has patient had a PCN reaction causing severe rash involving mucus membranes or skin necrosis: Unknown Has patient had a PCN reaction that required hospitalization: No Has patient had a PCN reaction occurring within the last 10 years: No If all of the above answers are "NO", then may proceed with Cephalosporin use.     Social History   Socioeconomic History   Marital status: Married    Spouse name: Not on file   Number of children: Not on file   Years of education: Not on file   Highest  education level: Not on file  Occupational History   Not on file  Tobacco Use   Smoking status: Never   Smokeless tobacco: Never  Substance and Sexual Activity   Alcohol use: No   Drug use: No   Sexual activity: Never  Other Topics Concern   Not on file  Social History Narrative   Not on file   Social Determinants of Health   Financial Resource Strain: Not on file  Food Insecurity: Not on file  Transportation Needs: Not on file  Physical Activity: Not on file  Stress: Not on file  Social Connections: Not on file   Vitals:   02/28/21 1002  BP: (!) 142/100  Pulse: 81  Resp: 16  Temp: 98.5 F (36.9 C)  SpO2: 97%   There is no height or weight on file to calculate BMI.  Physical Exam Vitals and nursing note reviewed.  Constitutional:  General: She is not in acute distress.    Appearance: She is well-developed.  HENT:     Head: Normocephalic and atraumatic.     Mouth/Throat:     Mouth: Mucous membranes are moist.     Pharynx: Oropharynx is clear.  Eyes:     Conjunctiva/sclera: Conjunctivae normal.  Cardiovascular:     Rate and Rhythm: Normal rate and regular rhythm.     Pulses:          Dorsalis pedis pulses are 2+ on the right side and 2+ on the left side.     Heart sounds: No murmur heard. Pulmonary:     Effort: Pulmonary effort is normal. No respiratory distress.     Breath sounds: Normal breath sounds.  Abdominal:     Palpations: Abdomen is soft. There is no hepatomegaly or mass.     Tenderness: There is no abdominal tenderness.  Musculoskeletal:     Lumbar back: No tenderness or bony tenderness.  Lymphadenopathy:     Cervical: No cervical adenopathy.  Skin:    General: Skin is warm.     Findings: No erythema or rash.  Neurological:     General: No focal deficit present.     Mental Status: She is alert and oriented to person, place, and time.     Cranial Nerves: No cranial nerve deficit.     Comments: Antalgic gait assisted by a cane.   Psychiatric:     Comments: Well groomed, good eye contact.   ASSESSMENT AND PLAN:  Ms.Nealie was seen today for follow-up.  Diagnoses and all orders for this visit: Orders Placed This Encounter  Procedures   CBC   Comprehensive metabolic panel   Microalbumin / creatinine urine ratio   Lab Results  Component Value Date   WBC 8.2 02/28/2021   HGB 11.0 (L) 02/28/2021   HCT 34.9 (L) 02/28/2021   MCV 83.9 02/28/2021   PLT 463.0 (H) 02/28/2021   Lab Results  Component Value Date   CREATININE 1.23 (H) 02/28/2021   BUN 13 02/28/2021   NA 138 02/28/2021   K 3.7 02/28/2021   CL 103 02/28/2021   CO2 25 02/28/2021   Lab Results  Component Value Date   MICROALBUR 17.7 (H) 02/28/2021   Chronic bilateral low back pain with bilateral sciatica Not well controlled. She has not filled Rx since 01/2020. Recommend taking Tramadol 50 mg daily if having pain and Tylenol does not help. She has tolerated well,some side effects discussed. [place] controlled subs report reviewed.  -     traMADol (ULTRAM) 50 MG tablet; Take 1 tablet (50 mg total) by mouth daily as needed for moderate pain.  Hypokalemia Continue KLOR 20 meq daily. Further recommendations according to BMP results.  Epistaxis Problem has been intermittent. She is not longer on Flonase. According pt, she was instructed to resume Aspirin 81 mg after 30 days of not nose bleed.  Essential hypertension, benign She is reporting BP's < 140/90 most of the time at home. Back pain could be contributing to elevated BP's, once pain is better controlled she was instructed to monitor BP and to let me know if persistently > 140/90. Continue Losartan 50 mg daily, Spironolactone 25 mg daily,and Metoprolol succinate 100 mg daily. We discussed possible complications of elevated BP.   Stage 3a chronic kidney disease (HCC) Cr and e GFR closer to baseline. Adequate hydration, low salt diet, adequate BP controlled,and avoidance of NSAID's to  continue. Continue Losartan 50 mg daily.  Return in about 3 months (around 05/29/2021) for [place] next appt and move to 05/2021.Marland Kitchen  Taelar Gronewold G. [place], MD  [facility]. [place] office.

## 2021-02-28 ENCOUNTER — Encounter: Payer: Self-pay | Admitting: Family Medicine

## 2021-02-28 ENCOUNTER — Ambulatory Visit (INDEPENDENT_AMBULATORY_CARE_PROVIDER_SITE_OTHER): Payer: Medicare Other | Admitting: Family Medicine

## 2021-02-28 VITALS — BP 142/100 | HR 81 | Temp 98.5°F | Resp 16 | Ht 66.0 in

## 2021-02-28 DIAGNOSIS — G8929 Other chronic pain: Secondary | ICD-10-CM

## 2021-02-28 DIAGNOSIS — N1831 Chronic kidney disease, stage 3a: Secondary | ICD-10-CM

## 2021-02-28 DIAGNOSIS — I10 Essential (primary) hypertension: Secondary | ICD-10-CM

## 2021-02-28 DIAGNOSIS — M5442 Lumbago with sciatica, left side: Secondary | ICD-10-CM

## 2021-02-28 DIAGNOSIS — E876 Hypokalemia: Secondary | ICD-10-CM | POA: Diagnosis not present

## 2021-02-28 DIAGNOSIS — M5441 Lumbago with sciatica, right side: Secondary | ICD-10-CM | POA: Diagnosis not present

## 2021-02-28 DIAGNOSIS — R040 Epistaxis: Secondary | ICD-10-CM

## 2021-02-28 LAB — COMPREHENSIVE METABOLIC PANEL
ALT: 18 U/L (ref 0–35)
AST: 19 U/L (ref 0–37)
Albumin: 4.1 g/dL (ref 3.5–5.2)
Alkaline Phosphatase: 70 U/L (ref 39–117)
BUN: 13 mg/dL (ref 6–23)
CO2: 25 mEq/L (ref 19–32)
Calcium: 9.4 mg/dL (ref 8.4–10.5)
Chloride: 103 mEq/L (ref 96–112)
Creatinine, Ser: 1.23 mg/dL — ABNORMAL HIGH (ref 0.40–1.20)
GFR: 45.1 mL/min — ABNORMAL LOW (ref >60.00)
Glucose, Bld: 89 mg/dL (ref 70–99)
Potassium: 3.7 mEq/L (ref 3.5–5.1)
Sodium: 138 mEq/L (ref 135–145)
Total Bilirubin: 0.2 mg/dL (ref 0.2–1.2)
Total Protein: 8.1 g/dL (ref 6.0–8.3)

## 2021-02-28 LAB — CBC
HCT: 34.9 % — ABNORMAL LOW (ref 36.0–46.0)
Hemoglobin: 11 g/dL — ABNORMAL LOW (ref 12.0–15.0)
MCHC: 31.4 g/dL (ref 30.0–36.0)
MCV: 83.9 fl (ref 78.0–100.0)
Platelets: 463 10*3/uL — ABNORMAL HIGH (ref 150.0–400.0)
RBC: 4.16 Mil/uL (ref 3.87–5.11)
RDW: 17.5 % — ABNORMAL HIGH (ref 11.5–15.5)
WBC: 8.2 10*3/uL (ref 4.0–10.5)

## 2021-02-28 LAB — MICROALBUMIN / CREATININE URINE RATIO
Creatinine,U: 452.3 mg/dL
Microalb Creat Ratio: 3.9 mg/g (ref 0.0–30.0)
Microalb, Ur: 17.7 mg/dL — ABNORMAL HIGH (ref 0.0–1.9)

## 2021-02-28 MED ORDER — TRAMADOL HCL 50 MG PO TABS: 50.0000 mg | ORAL_TABLET | Freq: Every day | ORAL | 1 refills | Status: DC | PRN

## 2021-02-28 NOTE — Assessment & Plan Note (Addendum)
Cr and e GFR closer to baseline. Adequate hydration, low salt diet, adequate BP controlled,and avoidance of NSAID's to continue. Continue Losartan 50 mg daily.

## 2021-02-28 NOTE — Assessment & Plan Note (Addendum)
She is reporting BP's < 140/90 most of the time at home. Back pain could be contributing to elevated BP's, once pain is better controlled she was instructed to monitor BP and to let me know if persistently > 140/90. Continue Losartan 50 mg daily, Spironolactone 25 mg daily,and Metoprolol succinate 100 mg daily. We discussed possible complications of elevated BP.

## 2021-02-28 NOTE — Patient Instructions (Addendum)
A few things to remember from today's visit:   Essential hypertension, benign - Plan: CBC, Comprehensive metabolic panel  Hypokalemia - Plan: Comprehensive metabolic panel  Stage 3a chronic kidney disease ([place]) - Plan: Comprehensive metabolic panel  Chronic bilateral low back pain with bilateral sciatica - Plan: traMADol (ULTRAM) 50 MG tablet  If you need refills please call your pharmacy. Do not use My Chart to request refills or for acute issues that need immediate attention.   Continue monitoring blood pressure at home, if persistently above 140/90 I need to know. No changes in rest.   Please be sure medication list is accurate. If a new problem present, please set up appointment sooner than planned today.

## 2021-03-02 ENCOUNTER — Encounter: Payer: Self-pay | Admitting: Family Medicine

## 2021-03-09 DIAGNOSIS — H2511 Age-related nuclear cataract, right eye: Secondary | ICD-10-CM | POA: Diagnosis not present

## 2021-03-09 DIAGNOSIS — H401132 Primary open-angle glaucoma, bilateral, moderate stage: Secondary | ICD-10-CM | POA: Diagnosis not present

## 2021-03-09 DIAGNOSIS — I6529 Occlusion and stenosis of unspecified carotid artery: Secondary | ICD-10-CM | POA: Diagnosis not present

## 2021-03-10 ENCOUNTER — Other Ambulatory Visit (HOSPITAL_BASED_OUTPATIENT_CLINIC_OR_DEPARTMENT_OTHER): Payer: Self-pay | Admitting: Ophthalmology

## 2021-03-10 DIAGNOSIS — I6521 Occlusion and stenosis of right carotid artery: Secondary | ICD-10-CM

## 2021-03-10 DIAGNOSIS — H2511 Age-related nuclear cataract, right eye: Secondary | ICD-10-CM

## 2021-03-10 DIAGNOSIS — H401132 Primary open-angle glaucoma, bilateral, moderate stage: Secondary | ICD-10-CM

## 2021-03-13 ENCOUNTER — Ambulatory Visit: Payer: Medicare Other | Admitting: Family Medicine

## 2021-03-15 ENCOUNTER — Other Ambulatory Visit: Payer: Self-pay

## 2021-03-15 ENCOUNTER — Ambulatory Visit (HOSPITAL_BASED_OUTPATIENT_CLINIC_OR_DEPARTMENT_OTHER)
Admission: RE | Admit: 2021-03-15 | Discharge: 2021-03-15 | Disposition: A | Discharge status: Home / Self Care | Payer: Medicare Other | Source: Ambulatory Visit | Attending: Ophthalmology | Admitting: Ophthalmology

## 2021-03-15 DIAGNOSIS — I6523 Occlusion and stenosis of bilateral carotid arteries: Secondary | ICD-10-CM | POA: Diagnosis not present

## 2021-03-15 DIAGNOSIS — I6521 Occlusion and stenosis of right carotid artery: Secondary | ICD-10-CM | POA: Diagnosis not present

## 2021-03-15 DIAGNOSIS — H2511 Age-related nuclear cataract, right eye: Secondary | ICD-10-CM | POA: Insufficient documentation

## 2021-03-15 DIAGNOSIS — H401132 Primary open-angle glaucoma, bilateral, moderate stage: Secondary | ICD-10-CM | POA: Diagnosis not present

## 2021-03-30 DIAGNOSIS — H401132 Primary open-angle glaucoma, bilateral, moderate stage: Secondary | ICD-10-CM | POA: Diagnosis not present

## 2021-03-30 DIAGNOSIS — H2511 Age-related nuclear cataract, right eye: Secondary | ICD-10-CM | POA: Diagnosis not present

## 2021-03-30 DIAGNOSIS — I6529 Occlusion and stenosis of unspecified carotid artery: Secondary | ICD-10-CM | POA: Diagnosis not present

## 2021-04-03 ENCOUNTER — Telehealth: Payer: Self-pay | Admitting: Family Medicine

## 2021-04-03 ENCOUNTER — Telehealth: Payer: Self-pay | Admitting: Internal Medicine

## 2021-04-03 MED ORDER — LOSARTAN POTASSIUM 50 MG PO TABS: 50.0000 mg | ORAL_TABLET | Freq: Every day | ORAL | 3 refills | Status: DC

## 2021-04-03 NOTE — Telephone Encounter (Signed)
Rx sent in

## 2021-04-03 NOTE — Telephone Encounter (Signed)
Called pt to inform her that she needed to make an overdue appt with Dr. Tenny Craw before a 90 day supply. Pt stated that Dr. Tenny Craw did not need to see her but as needed. I explained to there pt that in order to get a refill on medication, she needed to be seen by Dr. Tenny Craw or pt could refill with PCP. Pt stated that she would contact her PCP for a refill. I advised the pt that if she has any other problems, questions or concerns, to give our office a call back. Pt verbalized understanding.

## 2021-04-03 NOTE — Telephone Encounter (Signed)
Pt call and stated she need a refill onlosartan (COZAAR) 50 MG tablet  she stated she need a 90 day supply sent to  [id number] - [place], [place] - [address] AT [facility] [address] Phone:  [phone]  Fax:  [phone]

## 2021-04-03 NOTE — Telephone Encounter (Signed)
° °  Patient is out of medication   *STAT* If patient is at the pharmacy, call can be transferred to refill team.   1. Which medications need to be refilled? (please list name of each medication and dose if known)  losartan (COZAAR) 50 MG tablet  2. Which pharmacy/location (including street and city if local pharmacy) is medication to be sent to? [id number] - [place], [place] - [address] AT [place]  3. Do they need a 30 day or 90 day supply? 90 with refills  Patient said her insurance will not cover a 30 day supply any longer. She needs the office to put in an rx for a 90 day supply

## 2021-04-04 ENCOUNTER — Other Ambulatory Visit: Payer: Self-pay | Admitting: Family Medicine

## 2021-04-17 ENCOUNTER — Telehealth: Payer: Self-pay | Admitting: Family Medicine

## 2021-04-17 NOTE — Telephone Encounter (Signed)
Left message for patient to call back and schedule Medicare Annual Wellness Visit (AWV) either virtually or in office. Left  my Meghan Nichols number [phone]   Last AWV 08/03/19 please schedule at anytime with [facility] Nurse Health Advisor 1 or 2   This should be a 45 minute visit.

## 2021-04-17 NOTE — Telephone Encounter (Signed)
Yes, it is ok to schedule a skin TB test. Thanks, Meghan Nichols

## 2021-04-17 NOTE — Telephone Encounter (Signed)
Patient called in to schedule a TB test shot appointment.  Please advise.

## 2021-04-17 NOTE — Telephone Encounter (Signed)
Okay to schedule

## 2021-04-19 NOTE — Telephone Encounter (Signed)
Lvm for patient to call back to schedule TB Test w/ Nurse. ?

## 2021-05-10 ENCOUNTER — Other Ambulatory Visit: Payer: Self-pay | Admitting: Family Medicine

## 2021-05-10 DIAGNOSIS — E876 Hypokalemia: Secondary | ICD-10-CM

## 2021-05-27 NOTE — Progress Notes (Signed)
? ?HPI: ?Meghan Nichols is a 71 y.o. female, who is here today for chronic disease management. ? ?Last seen on 02/28/21 ?Hypertension:  ?Medications:Spironolactone 25 mg daily,losartan 50 mg daily, and Metoprolol succinate 100 mg daily. ?BP readings at home:130/70-80's. ?Side effects: None ?CKD III: Negative for decreased urine output,gross hematuria,or foam in urine. ?Hypokalemia: Currently she is on K-Lor 20 mK twice daily. ? ?Negative for unusual or severe headache, visual changes, exertional chest pain, dyspnea,  focal weakness, or edema. ? ?Lab Results  ?Component Value Date  ? CREATININE 1.23 (H) 02/28/2021  ? BUN 13 02/28/2021  ? NA 138 02/28/2021  ? K 3.7 02/28/2021  ? CL 103 02/28/2021  ? CO2 25 02/28/2021  ? ?Lab Results  ?Component Value Date  ? MICROALBUR 17.7 (H) 02/28/2021  ? MICROALBUR 72.7 (H) 12/07/2020  ? ?Walking 2 times per week for an hour and changed her diet, has noted some wt loss.Eating 2 meals daily most of the time and no food intake after 6 pm. ? ?Prediabetes: Negative for polydipsia,polyuria, or polyphagia. ? ?Lab Results  ?Component Value Date  ? HGBA1C 6.1 12/07/2020  ? ?HLD: She is on Pravastatin 20 mg daily. ?Tolerating medication well. ? ?Lab Results  ?Component Value Date  ? CHOL 207 (H) 12/07/2020  ? HDL 52.40 12/07/2020  ? LDLCALC 121 (H) 12/07/2020  ? LDLDIRECT 160.0 08/25/2015  ? TRIG 168.0 (H) 12/07/2020  ? CHOLHDL 4 12/07/2020  ? ?Chronic pain: Lower back pain exacerbated by prolonged walking and standing as well as cold weather. ?Alleviated by changing positions and rest. ?Sometimes pain is radiated to lower extremity, mainly left. ?Negative for saddle anesthesia or changes in bowel/bladder function. ? ?Currently she is on Tylenol and tramadol 50 mg daily as needed. ?She has tolerated medication well, no side effects reported. ? ?Review of Systems  ?Constitutional:  Negative for activity change, appetite change and fever.  ?HENT:  Negative for mouth sores, nosebleeds and  sore throat.   ?Respiratory:  Negative for cough and wheezing.   ?Gastrointestinal:  Negative for abdominal pain, nausea and vomiting.  ?     Negative for changes in bowel habits.  ?Musculoskeletal:  Positive for arthralgias and back pain. Negative for gait problem.  ?Neurological:  Negative for syncope and facial asymmetry.  ?Rest of ROS see pertinent positives and negatives in HPI. ? ?Current Outpatient Medications on File Prior to Visit  ?Medication Sig Dispense Refill  ? Ascorbic Acid (VITAMIN C) 100 MG tablet Take 100 mg by mouth daily.    ? aspirin 81 MG tablet Take 1 tablet (81 mg total) by mouth daily. 30 tablet   ? azelastine (ASTELIN) 0.1 % nasal spray Place 2 sprays into both nostrils 2 (two) times daily. Use in each nostril as directed 30 mL 3  ? brimonidine-timolol (COMBIGAN) 0.2-0.5 % ophthalmic solution Place 1 drop into the left eye 2 (two) times daily.    ? cholecalciferol (VITAMIN D) 1000 UNITS tablet Take 1,000 Units by mouth daily.    ? fexofenadine (ALLEGRA) 180 MG tablet TAKE 1 TABLET BY MOUTH EVERY DAY (Patient taking differently: Take 180 mg by mouth daily.) 90 tablet 3  ? KLOR-CON M20 20 MEQ tablet TAKE 1 TABLET BY MOUTH TWICE A DAY 180 tablet 2  ? metoprolol succinate (TOPROL-XL) 100 MG 24 hr tablet TAKE 1 TABLET EVERY DAY WITH A MEAL (Patient taking differently: 100 mg daily.) 90 tablet 2  ? pravastatin (PRAVACHOL) 20 MG tablet TAKE 1 TABLET BY MOUTH EVERY DAY (  Patient taking differently: Take 20 mg by mouth daily.) 90 tablet 3  ? spironolactone (ALDACTONE) 25 MG tablet TAKE 1 TABLET (25 MG TOTAL) BY MOUTH DAILY. 90 tablet 3  ? traMADol (ULTRAM) 50 MG tablet Take 1 tablet (50 mg total) by mouth daily as needed for moderate pain. 30 tablet 1  ? VITAMIN E PO Take 1 tablet by mouth daily.    ? VYZULTA 0.024 % SOLN Place 1 drop into both eyes daily.    ? ?No current facility-administered medications on file prior to visit.  ? ? ?Past Medical History:  ?Diagnosis Date  ? High cholesterol   ?  Hypertension   ? ?Allergies  ?Allergen Reactions  ? Penicillins Hives  ?  Has patient had a PCN reaction causing immediate rash, facial/tongue/throat swelling, SOB or lightheadedness with hypotension: Yes ?Has patient had a PCN reaction causing severe rash involving mucus membranes or skin necrosis: Unknown ?Has patient had a PCN reaction that required hospitalization: No ?Has patient had a PCN reaction occurring within the last 10 years: No ?If all of the above answers are "NO", then may proceed with Cephalosporin use. ?  ? ? ?Social History  ? ?Socioeconomic History  ? Marital status: Married  ?  Spouse name: Not on file  ? Number of children: Not on file  ? Years of education: Not on file  ? Highest education level: Not on file  ?Occupational History  ? Not on file  ?Tobacco Use  ? Smoking status: Never  ? Smokeless tobacco: Never  ?Substance and Sexual Activity  ? Alcohol use: No  ? Drug use: No  ? Sexual activity: Never  ?Other Topics Concern  ? Not on file  ?Social History Narrative  ? Not on file  ? ?Social Determinants of Health  ? ?Financial Resource Strain: Not on file  ?Food Insecurity: Not on file  ?Transportation Needs: Not on file  ?Physical Activity: Not on file  ?Stress: Not on file  ?Social Connections: Not on file  ? ?Vitals:  ? 05/29/21 0755  ?BP: 134/80  ?Pulse: 82  ?Resp: 16  ?SpO2: 98%  ? ?Body mass index is 30.4 kg/m?. ? ?Physical Exam ?Vitals and nursing note reviewed.  ?Constitutional:   ?   General: She is not in acute distress. ?   Appearance: She is well-developed.  ?HENT:  ?   Head: Normocephalic and atraumatic.  ?   Mouth/Throat:  ?   Mouth: Mucous membranes are moist.  ?   Pharynx: Oropharynx is clear.  ?Eyes:  ?   Conjunctiva/sclera: Conjunctivae normal.  ?Cardiovascular:  ?   Rate and Rhythm: Normal rate and regular rhythm.  ?   Pulses:     ?     Dorsalis pedis pulses are 2+ on the right side and 2+ on the left side.  ?   Heart sounds: No murmur heard. ?Pulmonary:  ?   Effort:  Pulmonary effort is normal. No respiratory distress.  ?   Breath sounds: Normal breath sounds.  ?Abdominal:  ?   Palpations: Abdomen is soft. There is no hepatomegaly or mass.  ?   Tenderness: There is no abdominal tenderness.  ?Lymphadenopathy:  ?   Cervical: No cervical adenopathy.  ?Skin: ?   General: Skin is warm.  ?   Findings: No erythema or rash.  ?Neurological:  ?   General: No focal deficit present.  ?   Mental Status: She is alert and oriented to person, place, and time.  ?  Cranial Nerves: No cranial nerve deficit.  ?   Gait: Gait normal.  ?Psychiatric:  ?   Comments: Well groomed, good eye contact.  ? ?ASSESSMENT AND PLAN: ? ?Ms.Britta MccreedyBarbara was seen today for follow-up. ? ?Diagnoses and all orders for this visit: ? ?Orders Placed This Encounter  ?Procedures  ? Basic metabolic panel  ? Hemoglobin A1c  ? Lipid panel  ? Microalbumin / creatinine urine ratio  ? ?Lab Results  ?Component Value Date  ? CREATININE 0.98 05/29/2021  ? BUN 16 05/29/2021  ? NA 142 05/29/2021  ? K 3.5 05/29/2021  ? CL 106 05/29/2021  ? CO2 25 05/29/2021  ? ?Lab Results  ?Component Value Date  ? MICROALBUR 27.3 (H) 05/29/2021  ? MICROALBUR 17.7 (H) 02/28/2021  ? ?Lab Results  ?Component Value Date  ? HGBA1C 6.4 05/29/2021  ? ?Lab Results  ?Component Value Date  ? CHOL 211 (H) 05/29/2021  ? HDL 59.00 05/29/2021  ? LDLCALC 123 (H) 05/29/2021  ? LDLDIRECT 160.0 08/25/2015  ? TRIG 143.0 05/29/2021  ? CHOLHDL 4 05/29/2021  ? ?Essential hypertension, benign ?BP adequately controlled. ?Continue Spironolactone 25 mg daily,losartan 50 mg daily, and Metoprolol succinate 100 mg daily.Some side effects of meds discussed. ?Continue ow salt/DASH diet. ?Continue monitoring BP regularly. ? ?Prediabetes ?Encouraged to be consistent with following a healthy life style for diabetes prevention. ?She has lost about 6 Lb since her last visit. ? ?Hypokalemia ?Problem has been stable. ?Continue KLOR 20 meq bid. ?Further recommendations according to BMP  results. ? ?Hyperlipemia, mixed ?Continue Pravastatin 20 mg daily and low fat diet. ?Further recommendations according to FLP results. ? ?Chronic back pain ?Stable. ?Continue Tramadol 50 mg daily prn. ?She can also t

## 2021-05-29 ENCOUNTER — Encounter: Payer: Self-pay | Admitting: Family Medicine

## 2021-05-29 ENCOUNTER — Ambulatory Visit (INDEPENDENT_AMBULATORY_CARE_PROVIDER_SITE_OTHER): Payer: Medicare Other | Admitting: Family Medicine

## 2021-05-29 VITALS — BP 134/80 | HR 82 | Resp 16 | Ht 66.0 in | Wt 188.4 lb

## 2021-05-29 DIAGNOSIS — E876 Hypokalemia: Secondary | ICD-10-CM

## 2021-05-29 DIAGNOSIS — R7303 Prediabetes: Secondary | ICD-10-CM | POA: Diagnosis not present

## 2021-05-29 DIAGNOSIS — M5441 Lumbago with sciatica, right side: Secondary | ICD-10-CM

## 2021-05-29 DIAGNOSIS — N1831 Chronic kidney disease, stage 3a: Secondary | ICD-10-CM | POA: Diagnosis not present

## 2021-05-29 DIAGNOSIS — I10 Essential (primary) hypertension: Secondary | ICD-10-CM | POA: Diagnosis not present

## 2021-05-29 DIAGNOSIS — I6521 Occlusion and stenosis of right carotid artery: Secondary | ICD-10-CM

## 2021-05-29 DIAGNOSIS — M5442 Lumbago with sciatica, left side: Secondary | ICD-10-CM

## 2021-05-29 DIAGNOSIS — G8929 Other chronic pain: Secondary | ICD-10-CM

## 2021-05-29 DIAGNOSIS — E782 Mixed hyperlipidemia: Secondary | ICD-10-CM | POA: Diagnosis not present

## 2021-05-29 LAB — BASIC METABOLIC PANEL
BUN: 16 mg/dL (ref 6–23)
CO2: 25 mEq/L (ref 19–32)
Calcium: 9.8 mg/dL (ref 8.4–10.5)
Chloride: 106 mEq/L (ref 96–112)
Creatinine, Ser: 0.98 mg/dL (ref 0.40–1.20)
GFR: 59.14 mL/min — ABNORMAL LOW (ref >60.00)
Glucose, Bld: 88 mg/dL (ref 70–99)
Potassium: 3.5 mEq/L (ref 3.5–5.1)
Sodium: 142 mEq/L (ref 135–145)

## 2021-05-29 LAB — LIPID PANEL
Cholesterol: 211 mg/dL — ABNORMAL HIGH (ref 0–200)
HDL: 59 mg/dL (ref >39.00)
LDL Cholesterol: 123 mg/dL — ABNORMAL HIGH (ref 0–99)
NonHDL: 151.58
Total CHOL/HDL Ratio: 4
Triglycerides: 143 mg/dL (ref 0.0–149.0)
VLDL: 28.6 mg/dL (ref 0.0–40.0)

## 2021-05-29 LAB — MICROALBUMIN / CREATININE URINE RATIO
Creatinine,U: 146.3 mg/dL
Microalb Creat Ratio: 18.7 mg/g (ref 0.0–30.0)
Microalb, Ur: 27.3 mg/dL — ABNORMAL HIGH (ref 0.0–1.9)

## 2021-05-29 LAB — HEMOGLOBIN A1C: Hgb A1c MFr Bld: 6.4 % (ref 4.6–6.5)

## 2021-05-29 MED ORDER — LOSARTAN POTASSIUM 50 MG PO TABS: 50.0000 mg | ORAL_TABLET | Freq: Every day | ORAL | 3 refills | Status: DC

## 2021-05-29 NOTE — Patient Instructions (Addendum)
A few things to remember from today's visit: ? ?Essential hypertension, benign - Plan: Basic metabolic panel ? ?Hypokalemia - Plan: Basic metabolic panel ? ?Stage 3a chronic kidney disease ([place]) - Plan: Basic metabolic panel, Microalbumin / creatinine urine ratio ? ?Prediabetes - Plan: Hemoglobin A1c ? ?Hyperlipemia, mixed - Plan: Lipid panel ? ?If you need refills please call your pharmacy. ?Do not use My Chart to request refills or for acute issues that need immediate attention. ?  ?No changes today. ?Continue monitoring blood pressure. ?Tramadol daily as needed, can take it with Tylenol. ? ?Please be sure medication list is accurate. ?If a new problem present, please set up appointment sooner than planned today. ? ? ? ? ? ? ? ?

## 2021-05-29 NOTE — Assessment & Plan Note (Signed)
Problem has been stable. ?Continue KLOR 20 meq bid. ?Further recommendations according to BMP results. ?

## 2021-05-29 NOTE — Assessment & Plan Note (Signed)
Encouraged to be consistent with following a healthy life style for diabetes prevention. ?She has lost about 6 Lb since her last visit. ?

## 2021-05-29 NOTE — Assessment & Plan Note (Signed)
Stable. ?Continue Tramadol 50 mg daily prn. ?She can also take Tylenol 500 mg 3-4 times per day. ?Med contract signed today. ?PMP reviewed. ?

## 2021-05-29 NOTE — Assessment & Plan Note (Signed)
BP adequately controlled. ?Continue Spironolactone 25 mg daily,losartan 50 mg daily, and Metoprolol succinate 100 mg daily.Some side effects of meds discussed. ?Continue ow salt/DASH diet. ?Continue monitoring BP regularly. ?

## 2021-05-29 NOTE — Assessment & Plan Note (Signed)
Continue Pravastatin 20 mg daily and low fat diet. Further recommendations according to FLP results. 

## 2021-05-29 NOTE — Assessment & Plan Note (Addendum)
Cr 1.0-1.2 and e GFR 50's. Last e GFR 45. ?Adequate hydration and BP controlled. ?Continue low salt diet and avoidance of NSAID's. ?

## 2021-05-31 ENCOUNTER — Other Ambulatory Visit: Payer: Self-pay

## 2021-05-31 MED ORDER — PRAVASTATIN SODIUM 40 MG PO TABS: 40.0000 mg | ORAL_TABLET | Freq: Every day | ORAL | 3 refills | Status: DC

## 2021-07-06 DIAGNOSIS — H401132 Primary open-angle glaucoma, bilateral, moderate stage: Secondary | ICD-10-CM | POA: Diagnosis not present

## 2021-07-06 DIAGNOSIS — I6522 Occlusion and stenosis of left carotid artery: Secondary | ICD-10-CM | POA: Diagnosis not present

## 2021-07-06 DIAGNOSIS — H2511 Age-related nuclear cataract, right eye: Secondary | ICD-10-CM | POA: Diagnosis not present

## 2021-10-19 ENCOUNTER — Other Ambulatory Visit: Payer: Self-pay | Admitting: *Deleted

## 2021-10-19 NOTE — Patient Outreach (Signed)
  Care Coordination   10/19/2021 Name: Jacquese Cassarino MRN: [id number] DOB: 30-Apr-1950   Care Coordination Outreach Attempts:  An unsuccessful telephone outreach was attempted today to offer the patient information about available care coordination services as a benefit of their health plan.   Follow Up Plan:  Additional outreach attempts will be made to offer the patient care coordination information and services.   Encounter Outcome:  No Answer  Care Coordination Interventions Activated:  No   Care Coordination Interventions:  No, not indicated    Elliot Cousin, RN Care Management Coordinator [facility] Main Office [phone]

## 2021-10-27 NOTE — Progress Notes (Signed)
HPI: Meghan Nichols is a 71 y.o. female, who is here today for follow up. She was last seen on 05/29/21. Hypertension:  Medications:Spironolactone 25 mg daily, Metoprolol succinate 100 mg daily, and Losartan 50 mg daily. BP readings at home:130's/60-80. Side effects:None. HypoK+: She is on KLOR 20 meq daily. CKD III: She has not noted gross hematuria, foam in urine, or decreased urine output. Negative for unusual or severe headache, visual changes, exertional chest pain, dyspnea,  focal weakness, or edema.  Lab Results  Component Value Date   CREATININE 0.98 05/29/2021   BUN 16 05/29/2021   NA 142 05/29/2021   K 3.5 05/29/2021   CL 106 05/29/2021   CO2 25 05/29/2021    Lab Results  Component Value Date   WBC 8.2 02/28/2021   HGB 11.0 (L) 02/28/2021   HCT 34.9 (L) 02/28/2021   MCV 83.9 02/28/2021   PLT 463.0 (H) 02/28/2021  She has not had colonoscopy done. According to pt, she was asked for credit card number and back account information, neither one she wants to provide. Chronic back pain:She takes Tramadol as needed. Pain exacerbated by prolonged standing/walking. Pain is sometimes radiated to the lower extremities,L>R. Negative for saddle anesthesia or bowel/bladder dysfunction.  Problem has been stable. She does not take Tramadol frequently.  Review of Systems  Constitutional:  Negative for activity change, appetite change and fever.  HENT:  Negative for mouth sores, nosebleeds and sore throat.   Respiratory:  Negative for cough and wheezing.   Gastrointestinal:  Negative for abdominal pain, nausea and vomiting.       Negative for changes in bowel habits.  Musculoskeletal:  Positive for arthralgias, back pain and gait problem.  Neurological:  Negative for syncope, facial asymmetry and weakness.  Rest see pertinent positives and negatives per HPI.  Current Outpatient Medications on File Prior to Visit  Medication Sig Dispense Refill   Ascorbic Acid (VITAMIN  C) 100 MG tablet Take 100 mg by mouth daily.     azelastine (ASTELIN) 0.1 % nasal spray Place 2 sprays into both nostrils 2 (two) times daily. Use in each nostril as directed 30 mL 3   brimonidine-timolol (COMBIGAN) 0.2-0.5 % ophthalmic solution Place 1 drop into the left eye 2 (two) times daily.     cholecalciferol (VITAMIN D) 1000 UNITS tablet Take 1,000 Units by mouth daily.     fexofenadine (ALLEGRA) 180 MG tablet TAKE 1 TABLET BY MOUTH EVERY DAY (Patient taking differently: Take 180 mg by mouth daily.) 90 tablet 3   KLOR-CON M20 20 MEQ tablet TAKE 1 TABLET BY MOUTH TWICE A DAY 180 tablet 2   losartan (COZAAR) 50 MG tablet Take 1 tablet (50 mg total) by mouth daily. 90 tablet 3   metoprolol succinate (TOPROL-XL) 100 MG 24 hr tablet TAKE 1 TABLET EVERY DAY WITH A MEAL (Patient taking differently: 100 mg daily.) 90 tablet 2   pravastatin (PRAVACHOL) 40 MG tablet Take 1 tablet (40 mg total) by mouth daily. 90 tablet 3   spironolactone (ALDACTONE) 25 MG tablet TAKE 1 TABLET (25 MG TOTAL) BY MOUTH DAILY. 90 tablet 3   VITAMIN E PO Take 1 tablet by mouth daily.     VYZULTA 0.024 % SOLN Place 1 drop into both eyes daily.     No current facility-administered medications on file prior to visit.   Past Medical History:  Diagnosis Date   High cholesterol    Hypertension    Allergies  Allergen Reactions   Penicillins  Hives    Has patient had a PCN reaction causing immediate rash, facial/tongue/throat swelling, SOB or lightheadedness with hypotension: Yes Has patient had a PCN reaction causing severe rash involving mucus membranes or skin necrosis: Unknown Has patient had a PCN reaction that required hospitalization: No Has patient had a PCN reaction occurring within the last 10 years: No If all of the above answers are "NO", then may proceed with Cephalosporin use.    Social History   Socioeconomic History   Marital status: Married    Spouse name: Not on file   Number of children: Not on  file   Years of education: Not on file   Highest education level: Not on file  Occupational History   Not on file  Tobacco Use   Smoking status: Never   Smokeless tobacco: Never  Substance and Sexual Activity   Alcohol use: No   Drug use: No   Sexual activity: Never  Other Topics Concern   Not on file  Social History Narrative   Not on file   Social Determinants of Health   Financial Resource Strain: Not on file  Food Insecurity: Not on file  Transportation Needs: Not on file  Physical Activity: Not on file  Stress: Not on file  Social Connections: Not on file   Vitals:   10/30/21 0809  BP: 128/80  Pulse: 78  Resp: 16  Temp: 98.6 F (37 C)  SpO2: 99%   Body mass index is 30.22 kg/m.  Physical Exam Vitals and nursing note reviewed.  Constitutional:      General: She is not in acute distress.    Appearance: She is well-developed and well-groomed.  HENT:     Head: Normocephalic and atraumatic.     Mouth/Throat:     Mouth: Mucous membranes are moist.     Pharynx: Oropharynx is clear.  Eyes:     Conjunctiva/sclera: Conjunctivae normal.  Cardiovascular:     Rate and Rhythm: Normal rate and regular rhythm.     Pulses:          Dorsalis pedis pulses are 2+ on the right side and 2+ on the left side.     Heart sounds: No murmur heard. Pulmonary:     Effort: Pulmonary effort is normal. No respiratory distress.     Breath sounds: Normal breath sounds.  Abdominal:     Palpations: Abdomen is soft. There is no hepatomegaly or mass.     Tenderness: There is no abdominal tenderness.  Musculoskeletal:     Right lower leg: No edema.     Left lower leg: No edema.  Lymphadenopathy:     Cervical: No cervical adenopathy.  Skin:    General: Skin is warm.     Findings: No erythema or rash.  Neurological:     General: No focal deficit present.     Mental Status: She is alert and oriented to person, place, and time.     Cranial Nerves: No cranial nerve deficit.      Comments: Antalgic gait,assisted by a cane.  Psychiatric:        Mood and Affect: Mood and affect normal.   ASSESSMENT AND PLAN:  Meghan Nichols was seen today for follow-up.  Diagnoses and all orders for this visit: Orders Placed This Encounter  Procedures   Cologuard   Urinalysis, Routine w reflex microscopic   Basic metabolic panel   Hemoglobin A1c   Lab Results  Component Value Date   CREATININE 1.13 10/30/2021  BUN 12 10/30/2021   NA 141 10/30/2021   K 3.7 10/30/2021   CL 108 10/30/2021   CO2 25 10/30/2021   Lab Results  Component Value Date   HGBA1C 6.4 10/30/2021   Essential hypertension, benign BP adequately controlled. Continue monitoring BP at home. No changes in Spironolactone,Metoprolol succinate,and losartan dose. Low salt/DASH diet recommended. Eye exam current.  Prediabetes Last HgA1C 6.4 in 05/2021. Recommend decreasing sweet tea intake. Further recommendations according to HgA1C result.  Hypokalemia Problem has been well controlled. Continue KLOR 20 meq daily. We discussed some side effects of spironolactone and Losartan.  Stage 3a chronic kidney disease ([place]) Problem has been stable. Continue low salt diet, good hydration,and avoidance of NSAID's. Adequate BP controlled. Continue Losartan.  Chronic back pain Stable. Continue Tramadol 50 mg daily prn. Some side effects discussed. PMP reviewed.  Colon cancer screening -     Cologuard  Return in about 5 months (around 04/01/2022).  Marcellis Frampton G. Martinique, MD  [facility]. [place] office.

## 2021-10-30 ENCOUNTER — Ambulatory Visit (INDEPENDENT_AMBULATORY_CARE_PROVIDER_SITE_OTHER): Payer: Medicare Other | Admitting: Family Medicine

## 2021-10-30 ENCOUNTER — Encounter: Payer: Self-pay | Admitting: Family Medicine

## 2021-10-30 VITALS — BP 128/80 | HR 78 | Temp 98.6°F | Resp 16 | Ht 66.0 in | Wt 187.2 lb

## 2021-10-30 DIAGNOSIS — R7303 Prediabetes: Secondary | ICD-10-CM | POA: Diagnosis not present

## 2021-10-30 DIAGNOSIS — G8929 Other chronic pain: Secondary | ICD-10-CM | POA: Diagnosis not present

## 2021-10-30 DIAGNOSIS — I6521 Occlusion and stenosis of right carotid artery: Secondary | ICD-10-CM

## 2021-10-30 DIAGNOSIS — N1831 Chronic kidney disease, stage 3a: Secondary | ICD-10-CM | POA: Diagnosis not present

## 2021-10-30 DIAGNOSIS — M5442 Lumbago with sciatica, left side: Secondary | ICD-10-CM

## 2021-10-30 DIAGNOSIS — E876 Hypokalemia: Secondary | ICD-10-CM

## 2021-10-30 DIAGNOSIS — I10 Essential (primary) hypertension: Secondary | ICD-10-CM

## 2021-10-30 DIAGNOSIS — Z1211 Encounter for screening for malignant neoplasm of colon: Secondary | ICD-10-CM | POA: Diagnosis not present

## 2021-10-30 DIAGNOSIS — M5441 Lumbago with sciatica, right side: Secondary | ICD-10-CM

## 2021-10-30 LAB — URINALYSIS, ROUTINE W REFLEX MICROSCOPIC
Bilirubin Urine: NEGATIVE
Hgb urine dipstick: NEGATIVE
Ketones, ur: NEGATIVE
Nitrite: NEGATIVE
RBC / HPF: NONE SEEN (ref 0–?)
Specific Gravity, Urine: >=1.03 — AB (ref 1.000–1.030)
Total Protein, Urine: 30 — AB
Urine Glucose: NEGATIVE
Urobilinogen, UA: 0.2 (ref 0.0–1.0)
pH: 6 (ref 5.0–8.0)

## 2021-10-30 LAB — BASIC METABOLIC PANEL
BUN: 12 mg/dL (ref 6–23)
CO2: 25 mEq/L (ref 19–32)
Calcium: 9 mg/dL (ref 8.4–10.5)
Chloride: 108 mEq/L (ref 96–112)
Creatinine, Ser: 1.13 mg/dL (ref 0.40–1.20)
GFR: 49.7 mL/min — ABNORMAL LOW (ref >60.00)
Glucose, Bld: 88 mg/dL (ref 70–99)
Potassium: 3.7 mEq/L (ref 3.5–5.1)
Sodium: 141 mEq/L (ref 135–145)

## 2021-10-30 LAB — HEMOGLOBIN A1C: Hgb A1c MFr Bld: 6.4 % (ref 4.6–6.5)

## 2021-10-30 MED ORDER — TRAMADOL HCL 50 MG PO TABS: 50.0000 mg | ORAL_TABLET | Freq: Every day | ORAL | 1 refills | Status: DC | PRN

## 2021-10-30 NOTE — Assessment & Plan Note (Signed)
Problem has been stable. Continue low salt diet, good hydration,and avoidance of NSAID's. Adequate BP controlled. Continue Losartan.

## 2021-10-30 NOTE — Assessment & Plan Note (Signed)
Last HgA1C 6.4 in 05/2021. Recommend decreasing sweet tea intake. Further recommendations according to HgA1C result.

## 2021-10-30 NOTE — Assessment & Plan Note (Signed)
Stable. Continue Tramadol 50 mg daily prn. Some side effects discussed. PMP reviewed.

## 2021-10-30 NOTE — Patient Instructions (Addendum)
A few things to remember from today's visit:  Essential hypertension, benign - Plan: Basic metabolic panel  Stage 3a chronic kidney disease (HCC) - Plan: Urinalysis, Routine w reflex microscopic, Basic metabolic panel  Chronic bilateral low back pain with bilateral sciatica  Colon cancer screening - Plan: Cologuard  Prediabetes - Plan: Hemoglobin A1c  If you need refills please call your pharmacy. Do not use My Chart to request refills or for acute issues that need immediate attention.   Try to decrease sweet tea intake.  Please be sure medication list is accurate. If a new problem present, please set up appointment sooner than planned today.

## 2021-10-30 NOTE — Assessment & Plan Note (Signed)
Problem has been well controlled. Continue KLOR 20 meq daily. We discussed some side effects of spironolactone and Losartan.

## 2021-10-30 NOTE — Assessment & Plan Note (Signed)
BP adequately controlled. Continue monitoring BP at home. No changes in Spironolactone,Metoprolol succinate,and losartan dose. Low salt/DASH diet recommended. Eye exam current.

## 2021-11-13 LAB — COLOGUARD

## 2021-11-14 ENCOUNTER — Telehealth: Payer: Self-pay

## 2021-11-14 NOTE — Patient Outreach (Signed)
  Care Coordination   11/14/2021 Name: Meghan Nichols MRN: [id number] DOB: [birth year]   Care Coordination Outreach Attempts:  A second unsuccessful outreach was attempted today to offer the patient with information about available care coordination services as a benefit of their health plan.     Follow Up Plan:  Additional outreach attempts will be made to offer the patient care coordination information and services.   Encounter Outcome:  No Answer  Care Coordination Interventions Activated:  No   Care Coordination Interventions:  No, not indicated    Peter Garter RN, BSN,CCM, [facility] [phone]

## 2021-11-22 ENCOUNTER — Telehealth: Payer: Self-pay

## 2021-11-22 NOTE — Patient Outreach (Signed)
  Care Coordination   11/22/2021 Name: Addilyne Backs MRN: [id number] DOB: July 20, 1950   Care Coordination Outreach Attempts:  A third unsuccessful outreach was attempted today to offer the patient with information about available care coordination services as a benefit of their health plan.   Follow Up Plan:  No further outreach attempts will be made at this time. We have been unable to contact the patient to offer or enroll patient in care coordination services  Encounter Outcome:  No Answer  Care Coordination Interventions Activated:  No   Care Coordination Interventions:  No, not indicated    Peter Garter RN, BSN,CCM, [facility] [phone]

## 2021-12-01 DIAGNOSIS — Z1211 Encounter for screening for malignant neoplasm of colon: Secondary | ICD-10-CM | POA: Diagnosis not present

## 2021-12-08 LAB — COLOGUARD: COLOGUARD: NEGATIVE

## 2022-01-24 LAB — HM MAMMOGRAPHY

## 2022-01-26 ENCOUNTER — Encounter: Payer: Self-pay | Admitting: Family Medicine

## 2022-02-01 ENCOUNTER — Other Ambulatory Visit: Payer: Self-pay | Admitting: Family Medicine

## 2022-02-01 DIAGNOSIS — J309 Allergic rhinitis, unspecified: Secondary | ICD-10-CM

## 2022-02-01 DIAGNOSIS — I10 Essential (primary) hypertension: Secondary | ICD-10-CM

## 2022-02-27 DIAGNOSIS — H2511 Age-related nuclear cataract, right eye: Secondary | ICD-10-CM | POA: Diagnosis not present

## 2022-02-27 DIAGNOSIS — I6522 Occlusion and stenosis of left carotid artery: Secondary | ICD-10-CM | POA: Diagnosis not present

## 2022-02-27 DIAGNOSIS — H401132 Primary open-angle glaucoma, bilateral, moderate stage: Secondary | ICD-10-CM | POA: Diagnosis not present

## 2022-03-25 ENCOUNTER — Other Ambulatory Visit: Payer: Self-pay | Admitting: Family Medicine

## 2022-03-25 DIAGNOSIS — I10 Essential (primary) hypertension: Secondary | ICD-10-CM

## 2022-03-29 DIAGNOSIS — H401132 Primary open-angle glaucoma, bilateral, moderate stage: Secondary | ICD-10-CM | POA: Diagnosis not present

## 2022-03-29 DIAGNOSIS — H2511 Age-related nuclear cataract, right eye: Secondary | ICD-10-CM | POA: Diagnosis not present

## 2022-03-29 DIAGNOSIS — I6529 Occlusion and stenosis of unspecified carotid artery: Secondary | ICD-10-CM | POA: Diagnosis not present

## 2022-03-30 ENCOUNTER — Other Ambulatory Visit: Payer: Self-pay | Admitting: Family Medicine

## 2022-03-30 DIAGNOSIS — E876 Hypokalemia: Secondary | ICD-10-CM

## 2022-03-30 NOTE — Progress Notes (Unsigned)
HPI: [name] is a 72 y.o. female, who is here today for follow up. She was last seen on 10/31/11. No new problems since her last visit.  Hypertension:  She is currently on Spironolactone 25 mg daily, Metoprolol succinate 100 mg daily, and Losartan 50 mg daily. BP readings at home:120-130's/80. HypoK+: She is on KLOR 20 meq daily. CKD III: She has not noted gross hematuria, foam in urine, or decreased urine output. Cr 0.9-1.2 and e GFR high 40's to 50's. Negative for unusual or severe headache, visual changes, exertional chest pain, dyspnea,  focal weakness, or worsening edema.  Lab Results  Component Value Date   CREATININE 1.13 10/30/2021   BUN 12 10/30/2021   NA 141 10/30/2021   K 3.7 10/30/2021   CL 108 10/30/2021   CO2 25 10/30/2021   Lab Results  Component Value Date   WBC 8.2 02/28/2021   HGB 11.0 (L) 02/28/2021   HCT 34.9 (L) 02/28/2021   MCV 83.9 02/28/2021   PLT 463.0 (H) 02/28/2021   Chronic back pain:She takes Tramadol 50 mg daily as needed.  She takes Tylenol 500 mg 1 to 2 tablets at bedtime. Pain exacerbated by prolonged standing/walking as well as cold weather. Pain is sometimes radiated to the lower extremities,L>R. Negative for fever,dysuria,pelvic pain,saddle anesthesia or bowel/bladder dysfunction. She does not take Tramadol daily and has tolerated medication well.  Prediabetes: Negative for polyphagia, polyuria, polydipsia. She does not exercise regularly due to pain. No significant changes in her diet since her last visit.  Lab Results  Component Value Date   HGBA1C 6.4 10/30/2021   HLD: Currently she is on pravastatin 40 mg daily. Lab Results  Component Value Date   CHOL 211 (H) 05/29/2021   HDL 59.00 05/29/2021   LDLCALC 123 (H) 05/29/2021   LDLDIRECT 160.0 08/25/2015   TRIG 143.0 05/29/2021   CHOLHDL 4 05/29/2021   Review of Systems  Constitutional:  Negative for activity change, appetite change and fever.  HENT:  Negative for  mouth sores, nosebleeds and sore throat.   Respiratory:  Negative for cough and wheezing.   Gastrointestinal:  Negative for abdominal pain, nausea and vomiting.       Negative for changes in bowel habits.  Skin:  Negative for rash.  Neurological:  Negative for syncope, facial asymmetry and weakness.  Psychiatric/Behavioral:  Negative for confusion. The patient is not nervous/anxious.   Rest see pertinent positives and negatives per HPI.  Current Outpatient Medications on File Prior to Visit  Medication Sig Dispense Refill   Ascorbic Acid (VITAMIN C) 100 MG tablet Take 100 mg by mouth daily.     azelastine (ASTELIN) 0.1 % nasal spray Place 2 sprays into both nostrils 2 (two) times daily. Use in each nostril as directed 30 mL 3   cholecalciferol (VITAMIN D) 1000 UNITS tablet Take 1,000 Units by mouth daily.     fexofenadine (ALLEGRA) 180 MG tablet TAKE 1 TABLET BY MOUTH EVERY DAY 90 tablet 3   KLOR-CON M20 20 MEQ tablet TAKE 1 TABLET BY MOUTH TWICE A DAY 180 tablet 2   losartan (COZAAR) 50 MG tablet Take 1 tablet (50 mg total) by mouth daily. 90 tablet 3   metoprolol succinate (TOPROL-XL) 100 MG 24 hr tablet TAKE 1 TABLET EVERY DAY WITH A MEAL 90 tablet 2   pravastatin (PRAVACHOL) 40 MG tablet Take 1 tablet (40 mg total) by mouth daily. 90 tablet 3   spironolactone (ALDACTONE) 25 MG tablet TAKE 1 TABLET (25 MG  TOTAL) BY MOUTH DAILY. 90 tablet 3   traMADol (ULTRAM) 50 MG tablet Take 1 tablet (50 mg total) by mouth daily as needed for moderate pain. 30 tablet 1   VITAMIN E PO Take 1 tablet by mouth daily.     No current facility-administered medications on file prior to visit.   Past Medical History:  Diagnosis Date   High cholesterol    Hypertension    Allergies  Allergen Reactions   Penicillins Hives    Has patient had a PCN reaction causing immediate rash, facial/tongue/throat swelling, SOB or lightheadedness with hypotension: Yes Has patient had a PCN reaction causing severe rash  involving mucus membranes or skin necrosis: Unknown Has patient had a PCN reaction that required hospitalization: No Has patient had a PCN reaction occurring within the last 10 years: No If all of the above answers are "NO", then may proceed with Cephalosporin use.    Social History   Socioeconomic History   Marital status: Married    Spouse name: Not on file   Number of children: Not on file   Years of education: Not on file   Highest education level: Not on file  Occupational History   Not on file  Tobacco Use   Smoking status: Never   Smokeless tobacco: Never  Substance and Sexual Activity   Alcohol use: No   Drug use: No   Sexual activity: Never  Other Topics Concern   Not on file  Social History Narrative   Not on file   Social Determinants of Health   Financial Resource Strain: Not on file  Food Insecurity: Not on file  Transportation Needs: Not on file  Physical Activity: Not on file  Stress: Not on file  Social Connections: Not on file   Vitals:   04/02/22 0758  BP: 130/80  Pulse: 85  Resp: 16  Temp: 98.3 F (36.8 C)  SpO2: 98%  Body mass index is 30.22 kg/m.  Physical Exam Vitals and nursing note reviewed.  Constitutional:      General: She is not in acute distress.    Appearance: She is well-developed and well-groomed.  HENT:     Head: Normocephalic and atraumatic.  Eyes:     Conjunctiva/sclera: Conjunctivae normal.  Cardiovascular:     Rate and Rhythm: Normal rate and regular rhythm.     Pulses:          Dorsalis pedis pulses are 2+ on the right side and 2+ on the left side.     Heart sounds: No murmur heard. Pulmonary:     Effort: Pulmonary effort is normal. No respiratory distress.     Breath sounds: Normal breath sounds.  Abdominal:     Palpations: Abdomen is soft. There is no hepatomegaly or mass.     Tenderness: There is no abdominal tenderness.  Musculoskeletal:     Right lower leg: No edema.     Left lower leg: No edema.   Lymphadenopathy:     Cervical: No cervical adenopathy.  Skin:    General: Skin is warm.     Findings: No erythema or rash.  Neurological:     General: No focal deficit present.     Mental Status: She is alert and oriented to person, place, and time.     Cranial Nerves: No cranial nerve deficit.     Comments: Antalgic gait,assisted by a cane.  Psychiatric:        Mood and Affect: Mood and affect normal.  ASSESSMENT AND PLAN:  [name] was seen today for medical management of chronic issues.  Diagnoses and all orders for this visit:  Chronic bilateral low back pain with bilateral sciatica Assessment & Plan: Problem has stable. Continue Tramadol 50 mg daily as needed and Tylenol 500 mg 3-2-3 times per day as needed. PMP reviewed, last refill in 10/2021. Some side effects discussed.   Essential hypertension, benign Assessment & Plan: BP adequately controlled. Continue Spironolactone 25 mg daily, Metoprolol succinate 100 mg daily, and Losartan 50 mg daily. Continue Low salt/DASH diet recommended. Eye exam current.  Orders: -     Hepatic function panel; Future  Stage 3a chronic kidney disease (HCC) Assessment & Plan: Cr 1.0-1.2 and e GFR has been in the high 40's and 50's. Adequate hydration and BP controlled. Continue low salt diet and avoidance of NSAID's.  Orders: -     Basic metabolic panel; Future -     VITAMIN D 25 Hydroxy (Vit-D Deficiency, Fractures); Future -     CBC; Future -     Microalbumin / creatinine urine ratio; Future  Prediabetes Assessment & Plan: Last HgA1C 6.4 in 10/2021. Her wt has been stable. Encourage consistency with a healthy lifestyle. Further recommendations according to HgA1C result.  Orders: -     Hemoglobin A1c; Future  Hyperlipemia, mixed Assessment & Plan: Continue pravastatin 40 mg daily and low-fat diet. Further recommendation will be given according to lipid panel result.  Orders: -     Hepatic function panel;  Future -     Lipid panel; Future  Need for influenza vaccination -     Flu Vaccine QUAD High Dose(Fluad)  Return in about 6 months (around 10/01/2022) for chronic problems.  Daneil Beem G. Martinique, MD  [facility]. [place] office.

## 2022-04-02 ENCOUNTER — Ambulatory Visit (INDEPENDENT_AMBULATORY_CARE_PROVIDER_SITE_OTHER): Payer: Medicare Other | Admitting: Family Medicine

## 2022-04-02 ENCOUNTER — Encounter: Payer: Self-pay | Admitting: Family Medicine

## 2022-04-02 VITALS — BP 130/80 | HR 85 | Temp 98.3°F | Resp 16 | Ht 66.0 in | Wt 187.2 lb

## 2022-04-02 DIAGNOSIS — R7303 Prediabetes: Secondary | ICD-10-CM | POA: Diagnosis not present

## 2022-04-02 DIAGNOSIS — E782 Mixed hyperlipidemia: Secondary | ICD-10-CM

## 2022-04-02 DIAGNOSIS — N1831 Chronic kidney disease, stage 3a: Secondary | ICD-10-CM | POA: Diagnosis not present

## 2022-04-02 DIAGNOSIS — M5442 Lumbago with sciatica, left side: Secondary | ICD-10-CM | POA: Diagnosis not present

## 2022-04-02 DIAGNOSIS — G8929 Other chronic pain: Secondary | ICD-10-CM | POA: Diagnosis not present

## 2022-04-02 DIAGNOSIS — I10 Essential (primary) hypertension: Secondary | ICD-10-CM | POA: Diagnosis not present

## 2022-04-02 DIAGNOSIS — M5441 Lumbago with sciatica, right side: Secondary | ICD-10-CM

## 2022-04-02 DIAGNOSIS — Z23 Encounter for immunization: Secondary | ICD-10-CM | POA: Diagnosis not present

## 2022-04-02 LAB — BASIC METABOLIC PANEL
BUN: 15 mg/dL (ref 6–23)
CO2: 25 mEq/L (ref 19–32)
Calcium: 9.4 mg/dL (ref 8.4–10.5)
Chloride: 106 mEq/L (ref 96–112)
Creatinine, Ser: 1.16 mg/dL (ref 0.40–1.20)
GFR: 48.02 mL/min — ABNORMAL LOW (ref >60.00)
Glucose, Bld: 83 mg/dL (ref 70–99)
Potassium: 3.8 mEq/L (ref 3.5–5.1)
Sodium: 140 mEq/L (ref 135–145)

## 2022-04-02 LAB — HEPATIC FUNCTION PANEL
ALT: 27 U/L (ref 0–35)
AST: 24 U/L (ref 0–37)
Albumin: 3.9 g/dL (ref 3.5–5.2)
Alkaline Phosphatase: 65 U/L (ref 39–117)
Bilirubin, Direct: 0 mg/dL (ref 0.0–0.3)
Total Bilirubin: 0.4 mg/dL (ref 0.2–1.2)
Total Protein: 7.5 g/dL (ref 6.0–8.3)

## 2022-04-02 LAB — CBC
HCT: 41.2 % (ref 36.0–46.0)
Hemoglobin: 13.3 g/dL (ref 12.0–15.0)
MCHC: 32.2 g/dL (ref 30.0–36.0)
MCV: 83.4 fl (ref 78.0–100.0)
Platelets: 273 10*3/uL (ref 150.0–400.0)
RBC: 4.94 Mil/uL (ref 3.87–5.11)
RDW: 16.3 % — ABNORMAL HIGH (ref 11.5–15.5)
WBC: 4.9 10*3/uL (ref 4.0–10.5)

## 2022-04-02 LAB — LIPID PANEL
Cholesterol: 171 mg/dL (ref 0–200)
HDL: 52.9 mg/dL (ref >39.00)
LDL Cholesterol: 97 mg/dL (ref 0–99)
NonHDL: 117.79
Total CHOL/HDL Ratio: 3
Triglycerides: 105 mg/dL (ref 0.0–149.0)
VLDL: 21 mg/dL (ref 0.0–40.0)

## 2022-04-02 LAB — HEMOGLOBIN A1C: Hgb A1c MFr Bld: 6.2 % (ref 4.6–6.5)

## 2022-04-02 LAB — VITAMIN D 25 HYDROXY (VIT D DEFICIENCY, FRACTURES): VITD: 42.85 ng/mL (ref 30.00–100.00)

## 2022-04-02 MED ORDER — LOSARTAN POTASSIUM 50 MG PO TABS: 50.0000 mg | ORAL_TABLET | Freq: Every day | ORAL | 3 refills | Status: DC

## 2022-04-02 NOTE — Addendum Note (Signed)
Addended by: Rosalyn Gess D on: 04/02/2022 08:57 AM   Modules accepted: Orders

## 2022-04-02 NOTE — Patient Instructions (Addendum)
A few things to remember from today's visit:  Essential hypertension, benign - Plan: Hepatic function panel  Stage 3a chronic kidney disease ([place]) - Plan: Basic metabolic panel, VITAMIN D 25 Hydroxy (Vit-D Deficiency, Fractures), CBC, Microalbumin / creatinine urine ratio  Prediabetes - Plan: Hemoglobin A1c  Hyperlipemia, mixed - Plan: Hepatic function panel, Lipid panel  No changes today.  If you need refills for medications you take chronically, please call your pharmacy. Do not use My Chart to request refills or for acute issues that need immediate attention. If you send a my chart message, it may take a few days to be addressed, specially if I am not in the office.  Please be sure medication list is accurate. If a new problem present, please set up appointment sooner than planned today.

## 2022-04-02 NOTE — Assessment & Plan Note (Signed)
Problem has stable. Continue Tramadol 50 mg daily as needed and Tylenol 500 mg 3-2-3 times per day as needed. PMP reviewed, last refill in 10/2021. Some side effects discussed.

## 2022-04-02 NOTE — Assessment & Plan Note (Signed)
BP adequately controlled. Continue Spironolactone 25 mg daily, Metoprolol succinate 100 mg daily, and Losartan 50 mg daily. Continue Low salt/DASH diet recommended. Eye exam current.

## 2022-04-02 NOTE — Assessment & Plan Note (Signed)
Cr 1.0-1.2 and e GFR has been in the high 40's and 50's. Adequate hydration and BP controlled. Continue low salt diet and avoidance of NSAID's.

## 2022-04-02 NOTE — Assessment & Plan Note (Addendum)
Last HgA1C 6.4 in 10/2021. Her wt has been stable. Encourage consistency with a healthy lifestyle. Further recommendations according to HgA1C result.

## 2022-04-02 NOTE — Assessment & Plan Note (Signed)
Continue pravastatin 40 mg daily and low-fat diet. Further recommendation will be given according to lipid panel result.

## 2022-04-30 ENCOUNTER — Ambulatory Visit (INDEPENDENT_AMBULATORY_CARE_PROVIDER_SITE_OTHER): Payer: Medicare Other | Admitting: Podiatry

## 2022-04-30 DIAGNOSIS — L989 Disorder of the skin and subcutaneous tissue, unspecified: Secondary | ICD-10-CM

## 2022-04-30 NOTE — Progress Notes (Signed)
   Chief Complaint  Patient presents with   Callouses    Patient came in today for bilateral corns on the 5th toes    Subjective: 72 y.o. female presenting to the office today for evaluation of a symptomatic callus to the fifth digit right foot and to a lesser extent the left fifth digit as well.  Gradual onset.  She has noticed increased pain and tenderness over the past 3 weeks.  She has had them debrided in the past which seems to alleviate her symptoms for a very long period of time.  Present for further treatment evaluation   Past Medical History:  Diagnosis Date   High cholesterol    Hypertension     Past Surgical History:  Procedure Laterality Date   ABDOMINAL HYSTERECTOMY      Allergies  Allergen Reactions   Penicillins Hives    Has patient had a PCN reaction causing immediate rash, facial/tongue/throat swelling, SOB or lightheadedness with hypotension: Yes Has patient had a PCN reaction causing severe rash involving mucus membranes or skin necrosis: Unknown Has patient had a PCN reaction that required hospitalization: No Has patient had a PCN reaction occurring within the last 10 years: No If all of the above answers are "NO", then may proceed with Cephalosporin use.      Objective:  Physical Exam General: Alert and oriented x3 in no acute distress  Dermatology: Hyperkeratotic lesion(s) present on the bilateral fifth digits right greater than the left. Pain on palpation with a central nucleated core noted. Skin is warm, dry and supple bilateral lower extremities. Negative for open lesions or macerations.  Vascular: Palpable pedal pulses bilaterally. No edema or erythema noted. Capillary refill within normal limits.  Neurological: Epicritic and protective threshold grossly intact bilaterally.   Musculoskeletal Exam: Pain on palpation at the keratotic lesion(s) noted. Range of motion within normal limits bilateral. Muscle strength 5/5 in all groups  bilateral.  Assessment: 1.  Symptomatic callus; benign skin lesion 5th digit B/L. RT > LT   Plan of Care:  1. Patient evaluated 2. Excisional debridement of keratoic lesion(s) using a chisel blade was performed without incident.  3.  Patient felt significant relief and alleviation of her symptoms with simple debridement.  Recommend wide fitting shoes that do not constrict the toebox area 4. Patient is to return to the clinic PRN.   Edrick Kins, DPM [facility]  Dr. Edrick Kins, DPM    [address], [place] [address]                Office [phone]  Fax [phone]

## 2022-06-13 ENCOUNTER — Ambulatory Visit (INDEPENDENT_AMBULATORY_CARE_PROVIDER_SITE_OTHER): Payer: Medicare Other | Admitting: Podiatry

## 2022-06-13 ENCOUNTER — Ambulatory Visit (INDEPENDENT_AMBULATORY_CARE_PROVIDER_SITE_OTHER): Payer: Medicare Other

## 2022-06-13 DIAGNOSIS — M79671 Pain in right foot: Secondary | ICD-10-CM

## 2022-06-13 DIAGNOSIS — M2041 Other hammer toe(s) (acquired), right foot: Secondary | ICD-10-CM

## 2022-06-13 NOTE — Progress Notes (Signed)
Chief Complaint  Patient presents with   Callouses    Patient came in today for bilateral corns on the 5th toes follow-up,     Subjective: 72 y.o. female presenting to the office today for evaluation of a symptomatic callus to the fifth digit right foot patient continues to have pain and tenderness to the symptomatic callus and hammertoe of the right fifth digit.  She says that she has tried different topical corn callus remover's azafol well as wider fitting shoes but she continues to have pain and tenderness to this area.  She says if she wears any narrow her shoes they irritate the toe and they are very debilitating and painful.  This has been ongoing intermittently for several years but most exacerbated over the last few months.  She is hoping for a more definitive solution to her pain other than simple debridement  Past Medical History:  Diagnosis Date   High cholesterol    Hypertension     Past Surgical History:  Procedure Laterality Date   ABDOMINAL HYSTERECTOMY      Allergies  Allergen Reactions   Penicillins Hives    Has patient had a PCN reaction causing immediate rash, facial/tongue/throat swelling, SOB or lightheadedness with hypotension: Yes Has patient had a PCN reaction causing severe rash involving mucus membranes or skin necrosis: Unknown Has patient had a PCN reaction that required hospitalization: No Has patient had a PCN reaction occurring within the last 10 years: No If all of the above answers are "NO", then may proceed with Cephalosporin use.      Objective:  Physical Exam General: Alert and oriented x3 in no acute distress  Dermatology: Hyperkeratotic lesion(s) present on the bilateral fifth digits right greater than the left. Pain on palpation with a central nucleated core noted. Skin is warm, dry and supple bilateral lower extremities. Negative for open lesions or macerations.  Vascular: Palpable pedal pulses bilaterally. No edema or erythema noted.  Capillary refill within normal limits.  Neurological: Epicritic and protective threshold grossly intact bilaterally.   Musculoskeletal Exam: Pain on palpation at the keratotic lesion(s) noted. Range of motion within normal limits bilateral. Muscle strength 5/5 in all groups bilateral.  Adductovarus hammertoe deformity also noted to the fifth digit of the right foot greater than the left  Radiographic exam RT foot 06/13/2022: Normal osseous mineralization.  No fractures identified.  Assessment: 1.  Symptomatic callus; benign skin lesion 5th digit B/L. RT > LT 2.  Symptomatic adductovarus hammertoe deformity fifth digit right  -Patient evaluated.  X-rays reviewed  -Excisional debridement of keratoic lesion(s) using a chisel blade was performed without incident.  -Today we did discuss the possibility of hammertoe arthroplasty and resection of the head of the proximal phalanx to alleviate pressure from the overlying corn and correct for the hammertoe deformity.  The patient would like to pursue this option.  The procedure was explained in detail to the patient.  Risk benefits advantages and disadvantages were all explained in detail.  The postoperative recovery course was also explained.  She understands that she will be weightbearing in a postsurgical shoe postoperatively.  Different options were provided for the patient including going to the surgery center so the patient can be asleep under anesthesia versus here in our office where the toe would be anesthetized locally without any sedation.  Patient opts for in office procedure -Authorization for an office procedure was achieved today.  Surgery will consist of hammertoe arthroplasty fifth digit right foot -Return to clinic  morning of in office surgery  Felecia Shelling, DPM [facility]  Dr. Felecia Shelling, DPM    [address] [place], [place] [address]                Office [phone]   Fax [phone]

## 2022-06-19 ENCOUNTER — Other Ambulatory Visit: Payer: Self-pay | Admitting: Podiatry

## 2022-06-19 DIAGNOSIS — M79671 Pain in right foot: Secondary | ICD-10-CM

## 2022-06-19 DIAGNOSIS — M2041 Other hammer toe(s) (acquired), right foot: Secondary | ICD-10-CM

## 2022-06-20 ENCOUNTER — Telehealth: Payer: Self-pay | Admitting: Family Medicine

## 2022-06-20 NOTE — Telephone Encounter (Signed)
Called patient to schedule Medicare Annual Wellness Visit (AWV). No voicemail available to leave a message.  Last date of AWV: 08/03/19  Please schedule an appointment at any time with [facility] or [employer].  If any questions, please contact me at [phone].  Thank you ,  Rudell Cobb AWV direct phone # [phone]

## 2022-07-11 DIAGNOSIS — I6529 Occlusion and stenosis of unspecified carotid artery: Secondary | ICD-10-CM | POA: Diagnosis not present

## 2022-07-11 DIAGNOSIS — H2511 Age-related nuclear cataract, right eye: Secondary | ICD-10-CM | POA: Diagnosis not present

## 2022-07-11 DIAGNOSIS — H401132 Primary open-angle glaucoma, bilateral, moderate stage: Secondary | ICD-10-CM | POA: Diagnosis not present

## 2022-07-11 DIAGNOSIS — H35352 Cystoid macular degeneration, left eye: Secondary | ICD-10-CM | POA: Diagnosis not present

## 2022-07-14 ENCOUNTER — Other Ambulatory Visit: Payer: Self-pay | Admitting: Family Medicine

## 2022-07-19 ENCOUNTER — Ambulatory Visit (INDEPENDENT_AMBULATORY_CARE_PROVIDER_SITE_OTHER): Payer: Medicare Other | Admitting: Podiatry

## 2022-07-19 DIAGNOSIS — M2041 Other hammer toe(s) (acquired), right foot: Secondary | ICD-10-CM | POA: Diagnosis not present

## 2022-07-19 NOTE — Progress Notes (Signed)
   OPERATIVE REPORT Patient name: Meghan Nichols MRN: [id number] DOB: 12/20/1950  DOS:  07/19/22  Preop Dx: Symptomatic hammertoe fifth digit right foot Postop Dx: same  Procedure:  1.  PIPJ arthroplasty fifth digit right foot  Surgeon: Felecia Shelling DPM  Anesthesia: 2% lidocaine plain totaling 3 mL infiltrated around the surgical area  Hemostasis: None  EBL: Minimal mL Materials: None Injectables: None Pathology: None  Condition: The patient tolerated the procedure and anesthesia well. No complications noted or reported   Justification for procedure: The patient is a 72 y.o. female who presents today for correction of a symptomatic hammertoe to the fifth digit of the right foot. Conservative modalities of been unsuccessful in providing any sort of satisfactory alleviation of symptoms with the patient. The patient was told benefits as well as possible side effects of the surgery. The patient consented for surgical correction. The patient consent form was reviewed. All patient questions were answered. No guarantees were expressed or implied.   Procedure in Detail: The patient was brought to the procedure room, placed in the procedure chair in the supine position at which time an aseptic scrub and drape were performed about the patient's respective lower extremity after anesthesia was induced as described above. Attention was then directed to the surgical area where procedure number one commenced.  Procedure #1: PIPJ arthroplasty with derotational skin plasty fifth digit right foot The 1.5 cm elliptical skin wedge was planned and made overlying the PIPJ of the fifth digit of the right foot in an obliquely oriented fashion.  The elliptical skin wedge was removed in toto to expose the underlying PIPJ.  Transverse tenotomy of the EDL tendon was performed using a #15 scalpel.  The soft tissue around the hypertrophic head of the proximal phalanx was reflected away to allow exposure of the  head of the phalanx in preparation for the ensuing osteotomy.  Osteotomy was created at the surgical phalangeal neck of the proximal phalanx using bone cutting forceps.  The head of the proximal phalanx was removed in toto.  Irrigation was then utilized in preparation for primary closure.  4-0 Vicryl suture was utilized to reapproximate the opposing ends of the EDL tendon followed by primary closure of superficial skin edges using 5-0 Monocryl suture.  Dry sterile compressive dressings were then applied about the patient's lower extremity. The patient was then discharged from the office with adequate prescriptions for analgesia. Verbal as well as written instructions were provided for the patient regarding postprocedural care. The patient is to keep the dressings clean dry and intact until they are to follow up in the office upon discharge in one week.   Felecia Shelling, DPM [facility]  Dr. Felecia Shelling, DPM    [address] [place], [place] [address]                Office [phone]  Fax [phone]

## 2022-07-25 ENCOUNTER — Ambulatory Visit (INDEPENDENT_AMBULATORY_CARE_PROVIDER_SITE_OTHER): Payer: Medicare Other | Admitting: Podiatry

## 2022-07-25 DIAGNOSIS — M2041 Other hammer toe(s) (acquired), right foot: Secondary | ICD-10-CM

## 2022-07-25 NOTE — Progress Notes (Signed)
   Chief Complaint  Patient presents with   Routine Post Op    POV #1 DOS 07/19/2022 HAMMERTOE ARTHROPLASTY RT 5TH DIGIT    Subjective:  Patient presents today status post fifth toe arthroplasty of the right digit performed 07/19/2022 in the office.  Patient doing well.  No pain.  WBAT surgical shoe as instructed.  No new complaints.  Past Medical History:  Diagnosis Date   High cholesterol    Hypertension     Past Surgical History:  Procedure Laterality Date   ABDOMINAL HYSTERECTOMY      Allergies  Allergen Reactions   Penicillins Hives    Has patient had a PCN reaction causing immediate rash, facial/tongue/throat swelling, SOB or lightheadedness with hypotension: Yes Has patient had a PCN reaction causing severe rash involving mucus membranes or skin necrosis: Unknown Has patient had a PCN reaction that required hospitalization: No Has patient had a PCN reaction occurring within the last 10 years: No If all of the above answers are "NO", then may proceed with Cephalosporin use.     Objective/Physical Exam Neurovascular status intact.  Incision well coapted with sutures intact. No sign of infectious process noted. No dehiscence. No active bleeding noted.  Toes in good rectus alignment. Radiographic Exam RT foot 07/25/2022:  Interval resection of the head of the proximal phalanx fifth digit right  Assessment: 1. s/p fifth toe arthroplasty right. DOS: 07/19/2022 performed in office   Plan of Care:  -Patient was evaluated. X-rays reviewed - Dressings changed.  Patient may begin washing and showering and getting the foot wet -Recommend triple antibiotic and a Band-Aid daily -Continue WBAT surgical shoe -Return to clinic 1 week suture removal   Felecia Shelling, DPM [facility]  Dr. Felecia Shelling, DPM    [address] [place], [place] [address]                Office [phone]  Fax [phone]

## 2022-08-01 ENCOUNTER — Encounter: Payer: Medicare Other | Admitting: Podiatry

## 2022-08-06 ENCOUNTER — Encounter: Payer: Self-pay | Admitting: Podiatry

## 2022-08-06 ENCOUNTER — Ambulatory Visit (INDEPENDENT_AMBULATORY_CARE_PROVIDER_SITE_OTHER): Payer: Medicare Other | Admitting: Podiatry

## 2022-08-06 DIAGNOSIS — M2041 Other hammer toe(s) (acquired), right foot: Secondary | ICD-10-CM

## 2022-08-06 NOTE — Progress Notes (Signed)
   Chief Complaint  Patient presents with   Routine Post Op    "It's doing good."    Subjective:  Patient presents today status post fifth toe arthroplasty of the right digit performed 07/19/2022 in the office.  Patient continues to do well.  No pain.  WBAT surgical shoe.  No new complaints  Past Medical History:  Diagnosis Date   High cholesterol    Hypertension     Past Surgical History:  Procedure Laterality Date   ABDOMINAL HYSTERECTOMY      Allergies  Allergen Reactions   Penicillins Hives    Has patient had a PCN reaction causing immediate rash, facial/tongue/throat swelling, SOB or lightheadedness with hypotension: Yes Has patient had a PCN reaction causing severe rash involving mucus membranes or skin necrosis: Unknown Has patient had a PCN reaction that required hospitalization: No Has patient had a PCN reaction occurring within the last 10 years: No If all of the above answers are "NO", then may proceed with Cephalosporin use.     Objective/Physical Exam Neurovascular status intact.  Incision well coapted with sutures intact. No sign of infectious process noted. No dehiscence. No active bleeding noted.  Toes in good rectus alignment.  Radiographic Exam RT foot 07/25/2022:  Interval resection of the head of the proximal phalanx fifth digit right  Assessment: 1. s/p fifth toe arthroplasty right. DOS: 07/19/2022 performed in office   Plan of Care:  -Patient was evaluated -Sutures removed -Patient may discontinue postop shoe.  Recommend good supportive tennis shoes that are wide in the toebox area and do not irritate or constrict the toe -Return to full activity no restrictions -Return to clinic as needed   Felecia Shelling, DPM [facility]  Dr. Felecia Shelling, DPM    [address] [place], [place] [address]                Office [phone]  Fax [phone]

## 2022-08-08 DIAGNOSIS — H2511 Age-related nuclear cataract, right eye: Secondary | ICD-10-CM | POA: Diagnosis not present

## 2022-08-08 DIAGNOSIS — H401132 Primary open-angle glaucoma, bilateral, moderate stage: Secondary | ICD-10-CM | POA: Diagnosis not present

## 2022-08-08 DIAGNOSIS — H35352 Cystoid macular degeneration, left eye: Secondary | ICD-10-CM | POA: Diagnosis not present

## 2022-08-08 DIAGNOSIS — I6521 Occlusion and stenosis of right carotid artery: Secondary | ICD-10-CM | POA: Diagnosis not present

## 2022-08-15 ENCOUNTER — Encounter: Payer: Medicare Other | Admitting: Podiatry

## 2022-08-29 DIAGNOSIS — H401132 Primary open-angle glaucoma, bilateral, moderate stage: Secondary | ICD-10-CM | POA: Diagnosis not present

## 2022-08-29 DIAGNOSIS — H35352 Cystoid macular degeneration, left eye: Secondary | ICD-10-CM | POA: Diagnosis not present

## 2022-08-29 DIAGNOSIS — H2511 Age-related nuclear cataract, right eye: Secondary | ICD-10-CM | POA: Diagnosis not present

## 2022-09-26 ENCOUNTER — Other Ambulatory Visit: Payer: Self-pay | Admitting: Family Medicine

## 2022-09-26 DIAGNOSIS — I10 Essential (primary) hypertension: Secondary | ICD-10-CM

## 2022-10-26 NOTE — Progress Notes (Unsigned)
HPI: Meghan Nichols is a 72 y.o. female, who is here today for chronic disease management.  Last seen on 04/02/22 No new problems since her last visit. 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  Hypertension: She is currently on Spironolactone 25 mg daily, Metoprolol succinate 100 mg daily, and Losartan 50 mg daily. She reports that her blood pressure has improved since her last visit in February. She has been monitoring her BP at home and has observed readings of 116/65 and 120/60 mmHg. She attributes the improvement to reduced stress levels after moving to a new place and leaving a stressful family situation.   She has not noted unusual/severe headache, visual changes, dyspnea, CP, focal weakness, or edema.  HypoK+: She is on KLOR 20 meq daily. CKD III: Negative for gross hematuria, foam in urine, or decreased urine output. Cr 1.0-1.2 and e GFR high 40's.   Lab Results  Component Value Date   NA 140 04/02/2022   K 3.8 04/02/2022   CO2 25 04/02/2022   GLUCOSE 83 04/02/2022   BUN 15 04/02/2022   CREATININE 1.16 04/02/2022   CALCIUM 9.4 04/02/2022   GFR 48.02 (L) 04/02/2022   EGFR 49 (L) 05/09/2020   GFRNONAA 60 (L) 02/21/2021   Lab Results  Component Value Date   MICROALBUR 27.3 (H) 05/29/2021   MICROALBUR 17.7 (H) 02/28/2021   Lab Results  Component Value Date   WBC 4.9 04/02/2022   HGB 13.3 04/02/2022   HCT 41.2 04/02/2022   MCV 83.4 04/02/2022   PLT 273.0 04/02/2022   -She has a history of glaucoma in both eyes and had an eye exam about a month ago. She has a follow-up appointment scheduled for this week.  -Chronic back pain:She takes Tramadol 50 mg daily as needed.  She takes Tylenol 500 mg 1 to 2 tablets at bedtime. Pain exacerbated by prolonged standing/walking. Her back pain tends to worsen during cold and rainy weather.  Pain is sometimes radiated to the lower extremities,L>R.  She does not take Tramadol  daily and has tolerated medication well.  Medication is still helping.   Prediabetes: Negative for polyphagia, polyuria, polydipsia.   Lab Results  Component Value Date   HGBA1C 6.2 04/02/2022   Review of Systems  Constitutional:  Negative for activity change, appetite change, chills and fever.  HENT:  Negative for nosebleeds and sore throat.   Respiratory:  Negative for cough and wheezing.   Gastrointestinal:  Negative for abdominal pain, nausea and vomiting.  Skin:  Negative for rash.  Neurological:  Negative for syncope and facial asymmetry.  See other pertinent positives and negatives in HPI.  Current Outpatient Medications on File Prior to Visit  Medication Sig Dispense Refill   Ascorbic Acid (VITAMIN C) 100 MG tablet Take 100 mg by mouth daily.     azelastine (ASTELIN) 0.1 % nasal spray Place 2 sprays into both nostrils 2 (two) times daily. Use in each nostril as directed 30 mL 3   cholecalciferol (VITAMIN D) 1000 UNITS tablet Take 1,000 Units by mouth daily.     fexofenadine (ALLEGRA) 180 MG tablet TAKE 1 TABLET BY MOUTH EVERY DAY 90 tablet 3   KLOR-CON M20 20 MEQ tablet TAKE 1 TABLET BY MOUTH TWICE A DAY 180 tablet 2   losartan (COZAAR) 50 MG tablet Take 1 tablet (50 mg total) by mouth daily. 90 tablet 3   metoprolol succinate (TOPROL-XL) 100 MG 24 hr tablet TAKE 1 TABLET  EVERY DAY WITH A MEAL 90 tablet 2   pravastatin (PRAVACHOL) 40 MG tablet TAKE 1 TABLET BY MOUTH EVERY DAY 90 tablet 3   spironolactone (ALDACTONE) 25 MG tablet TAKE 1 TABLET (25 MG TOTAL) BY MOUTH DAILY. 90 tablet 3   VITAMIN E PO Take 1 tablet by mouth daily.     No current facility-administered medications on file prior to visit.   Past Medical History:  Diagnosis Date   High cholesterol    Hypertension    Allergies  Allergen Reactions   Penicillins Hives    Has patient had a PCN reaction causing immediate rash, facial/tongue/throat swelling, SOB or lightheadedness with hypotension: Yes Has patient  had a PCN reaction causing severe rash involving mucus membranes or skin necrosis: Unknown Has patient had a PCN reaction that required hospitalization: No Has patient had a PCN reaction occurring within the last 10 years: No If all of the above answers are "NO", then may proceed with Cephalosporin use.     Social History   Socioeconomic History   Marital status: Married    Spouse name: Not on file   Number of children: Not on file   Years of education: Not on file   Highest education level: Not on file  Occupational History   Not on file  Tobacco Use   Smoking status: Never   Smokeless tobacco: Never  Substance and Sexual Activity   Alcohol use: No   Drug use: No   Sexual activity: Never  Other Topics Concern   Not on file  Social History Narrative   Not on file   Social Determinants of Health   Financial Resource Strain: Not on file  Food Insecurity: Not on file  Transportation Needs: Not on file  Physical Activity: Not on file  Stress: Not on file  Social Connections: Not on file   Vitals:   10/29/22 0825  BP: 110/70  Pulse: 70  Resp: 16  Temp: 98.1 F (36.7 C)  SpO2: 94%   Body mass index is 30.69 kg/m.  Physical Exam Vitals and nursing note reviewed.  Constitutional:      General: She is not in acute distress.    Appearance: She is well-developed and well-groomed.  HENT:     Head: Normocephalic and atraumatic.     Mouth/Throat:     Mouth: Mucous membranes are moist.     Pharynx: Oropharynx is clear.  Eyes:     Conjunctiva/sclera: Conjunctivae normal.  Cardiovascular:     Rate and Rhythm: Normal rate and regular rhythm.     Pulses:          Dorsalis pedis pulses are 2+ on the right side and 2+ on the left side.     Heart sounds: No murmur heard. Pulmonary:     Effort: Pulmonary effort is normal. No respiratory distress.     Breath sounds: Normal breath sounds.  Abdominal:     Palpations: Abdomen is soft. There is no hepatomegaly or mass.      Tenderness: There is no abdominal tenderness.  Musculoskeletal:     Lumbar back: No tenderness. Negative right straight leg raise test and negative left straight leg raise test.  Lymphadenopathy:     Cervical: No cervical adenopathy.  Skin:    General: Skin is warm.     Findings: No erythema or rash.  Neurological:     General: No focal deficit present.     Mental Status: She is alert and oriented to person, place,  and time.     Cranial Nerves: No cranial nerve deficit.     Comments: Antalgic gait,assisted by a cane.  Psychiatric:        Mood and Affect: Mood and affect normal.   ASSESSMENT AND PLAN:  [name] was seen today for medical management of chronic issues.  Diagnoses and all orders for this visit:  Orders Placed This Encounter  Procedures   Flu Vaccine Trivalent High Dose (Fluad)   Basic metabolic panel   Microalbumin / creatinine urine ratio   Hemoglobin A1c   Lab Results  Component Value Date   HGBA1C 6.2 10/29/2022   Lab Results  Component Value Date   NA 142 10/29/2022   CL 106 10/29/2022   K 3.8 10/29/2022   CO2 25 10/29/2022   BUN 19 10/29/2022   CREATININE 1.34 (H) 10/29/2022   GFR 40.22 (L) 10/29/2022   CALCIUM 9.6 10/29/2022   PHOS 3.0 02/21/2021   ALBUMIN 3.9 04/02/2022   GLUCOSE 78 10/29/2022   Lab Results  Component Value Date   MICROALBUR 1.4 10/29/2022   MICROALBUR 27.3 (H) 05/29/2021  Chronic bilateral low back pain with bilateral sciatica Assessment & Plan: Problem has been otherwise stable. Because of CKD 3, she needs to avoid NSAIDs. Continue tramadol 50 mg daily as needed and acetaminophen 500 mg 3-4 times per day. PDMP reviewed. We discussed some side effects of medication.  Orders: -     traMADol HCl; Take 1 tablet (50 mg total) by mouth daily as needed for moderate pain.  Dispense: 30 tablet; Refill: 2  Stage 3a chronic kidney disease (HCC) Assessment & Plan: Problem has been stable. Cr 1.16-1.2 and e GFR  48-49. Stressed importance of adequate BP control and hydration. Continue avoiding NSAIDs. Low-salt diet also recommended. Depending of lab results, will consider adding Farxiga 10 mg.  Orders: -     Basic metabolic panel; Future -     Microalbumin / creatinine urine ratio; Future  Essential hypertension, benign Assessment & Plan: BP adequately controlled, reporting similar readings at home. Continue spironolactone 25 mg daily, metoprolol succinate 100 mg daily, and losartan 50 mg daily as well as low-salt diet. Eye exam is current. Continue monitoring BP at home. Follow-up in 6 months.  Orders: -     Basic metabolic panel; Future  Prediabetes Assessment & Plan: When A1c 6.2 in 03/2022. Encouraged consistency with a healthy lifestyle. Further recommendation will be given according to hemoglobin A1c.  Orders: -     Hemoglobin A1c; Future  Need for influenza vaccination -     Flu Vaccine Trivalent High Dose (Fluad)   Return in about 6 months (around 04/28/2023) for chronic problems.  Reginia Battie G. [place], MD  [facility]. [place] office.

## 2022-10-29 ENCOUNTER — Encounter: Payer: Self-pay | Admitting: Family Medicine

## 2022-10-29 ENCOUNTER — Ambulatory Visit (INDEPENDENT_AMBULATORY_CARE_PROVIDER_SITE_OTHER): Payer: Medicare Other | Admitting: Family Medicine

## 2022-10-29 VITALS — BP 110/70 | HR 70 | Temp 98.1°F | Resp 16 | Ht 66.0 in | Wt 190.1 lb

## 2022-10-29 DIAGNOSIS — Z23 Encounter for immunization: Secondary | ICD-10-CM

## 2022-10-29 DIAGNOSIS — G8929 Other chronic pain: Secondary | ICD-10-CM

## 2022-10-29 DIAGNOSIS — M5441 Lumbago with sciatica, right side: Secondary | ICD-10-CM | POA: Diagnosis not present

## 2022-10-29 DIAGNOSIS — N1831 Chronic kidney disease, stage 3a: Secondary | ICD-10-CM

## 2022-10-29 DIAGNOSIS — I10 Essential (primary) hypertension: Secondary | ICD-10-CM

## 2022-10-29 DIAGNOSIS — M5442 Lumbago with sciatica, left side: Secondary | ICD-10-CM

## 2022-10-29 DIAGNOSIS — E782 Mixed hyperlipidemia: Secondary | ICD-10-CM

## 2022-10-29 DIAGNOSIS — R7303 Prediabetes: Secondary | ICD-10-CM | POA: Diagnosis not present

## 2022-10-29 LAB — MICROALBUMIN / CREATININE URINE RATIO
Creatinine,U: 163.3 mg/dL
Microalb Creat Ratio: 0.9 mg/g (ref 0.0–30.0)
Microalb, Ur: 1.4 mg/dL (ref 0.0–1.9)

## 2022-10-29 LAB — BASIC METABOLIC PANEL
BUN: 19 mg/dL (ref 6–23)
CO2: 25 meq/L (ref 19–32)
Calcium: 9.6 mg/dL (ref 8.4–10.5)
Chloride: 106 meq/L (ref 96–112)
Creatinine, Ser: 1.34 mg/dL — ABNORMAL HIGH (ref 0.40–1.20)
GFR: 40.22 mL/min — ABNORMAL LOW (ref >60.00)
Glucose, Bld: 78 mg/dL (ref 70–99)
Potassium: 3.8 meq/L (ref 3.5–5.1)
Sodium: 142 meq/L (ref 135–145)

## 2022-10-29 LAB — HEMOGLOBIN A1C: Hgb A1c MFr Bld: 6.2 % (ref 4.6–6.5)

## 2022-10-29 MED ORDER — TRAMADOL HCL 50 MG PO TABS
50.0000 mg | ORAL_TABLET | Freq: Every day | ORAL | 2 refills | Status: DC | PRN
Start: 2022-10-29 — End: 2023-01-08

## 2022-10-29 NOTE — Assessment & Plan Note (Signed)
Stable otherwise, usually worse around thi time of the year. Continue Tramadol 50 mg daily prn and Tylenol 500 mg 3-4 times per day. Some side effects discussed. PMP reviewed.

## 2022-10-29 NOTE — Assessment & Plan Note (Signed)
Problem has been stable. Cr 1.16-1.2 and e GFR 48-49. Stressed importance of adequate BP control and hydration. Continue avoiding NSAIDs. Low-salt diet also recommended. Depending of lab results, will consider adding Farxiga 10 mg.

## 2022-10-29 NOTE — Assessment & Plan Note (Signed)
BP adequately controlled, reporting similar readings at home. Continue spironolactone 25 mg daily, metoprolol succinate 100 mg daily, and losartan 50 mg daily as well as low-salt diet. Eye exam is current. Continue monitoring BP at home. Follow-up in 6 months.

## 2022-10-29 NOTE — Assessment & Plan Note (Signed)
When A1c 6.2 in 03/2022. Encouraged consistency with a healthy lifestyle. Further recommendation will be given according to hemoglobin A1c.

## 2022-10-29 NOTE — Patient Instructions (Addendum)
A few things to remember from today's visit:  Chronic bilateral low back pain with bilateral sciatica - Plan: traMADol (ULTRAM) 50 MG tablet  Stage 3a chronic kidney disease (HCC) - Plan: Basic metabolic panel, Microalbumin / creatinine urine ratio  Essential hypertension, benign - Plan: Basic metabolic panel  Prediabetes - Plan: Hemoglobin A1c  No changes today.  If you need refills for medications you take chronically, please call your pharmacy. Do not use My Chart to request refills or for acute issues that need immediate attention. If you send a my chart message, it may take a few days to be addressed, specially if I am not in the office.  Please be sure medication list is accurate. If a new problem present, please set up appointment sooner than planned today.

## 2022-10-29 NOTE — Assessment & Plan Note (Signed)
Problem has been otherwise stable. Because of CKD 3, she needs to avoid NSAIDs. Continue tramadol 50 mg daily as needed and acetaminophen 500 mg 3-4 times per day. PDMP reviewed. We discussed some side effects of medication.

## 2022-10-31 DIAGNOSIS — H35352 Cystoid macular degeneration, left eye: Secondary | ICD-10-CM | POA: Diagnosis not present

## 2022-10-31 DIAGNOSIS — H401132 Primary open-angle glaucoma, bilateral, moderate stage: Secondary | ICD-10-CM | POA: Diagnosis not present

## 2022-10-31 DIAGNOSIS — H2511 Age-related nuclear cataract, right eye: Secondary | ICD-10-CM | POA: Diagnosis not present

## 2023-01-01 DIAGNOSIS — H2511 Age-related nuclear cataract, right eye: Secondary | ICD-10-CM | POA: Diagnosis not present

## 2023-01-01 DIAGNOSIS — H401132 Primary open-angle glaucoma, bilateral, moderate stage: Secondary | ICD-10-CM | POA: Diagnosis not present

## 2023-01-06 ENCOUNTER — Other Ambulatory Visit: Payer: Self-pay

## 2023-01-06 ENCOUNTER — Inpatient Hospital Stay (HOSPITAL_COMMUNITY): Payer: Medicare Other

## 2023-01-06 ENCOUNTER — Emergency Department (HOSPITAL_COMMUNITY): Payer: Medicare Other

## 2023-01-06 ENCOUNTER — Inpatient Hospital Stay (HOSPITAL_COMMUNITY)
Admission: EM | Admit: 2023-01-06 | Discharge: 2023-01-20 | DRG: 064 | Disposition: E | Payer: Medicare Other | Attending: Neurology | Admitting: Neurology

## 2023-01-06 DIAGNOSIS — R0689 Other abnormalities of breathing: Secondary | ICD-10-CM | POA: Diagnosis not present

## 2023-01-06 DIAGNOSIS — I6389 Other cerebral infarction: Secondary | ICD-10-CM | POA: Diagnosis not present

## 2023-01-06 DIAGNOSIS — Z66 Do not resuscitate: Secondary | ICD-10-CM | POA: Diagnosis not present

## 2023-01-06 DIAGNOSIS — Z833 Family history of diabetes mellitus: Secondary | ICD-10-CM

## 2023-01-06 DIAGNOSIS — G919 Hydrocephalus, unspecified: Secondary | ICD-10-CM | POA: Diagnosis not present

## 2023-01-06 DIAGNOSIS — R7303 Prediabetes: Secondary | ICD-10-CM | POA: Diagnosis present

## 2023-01-06 DIAGNOSIS — E872 Acidosis, unspecified: Secondary | ICD-10-CM | POA: Diagnosis not present

## 2023-01-06 DIAGNOSIS — I618 Other nontraumatic intracerebral hemorrhage: Secondary | ICD-10-CM | POA: Diagnosis present

## 2023-01-06 DIAGNOSIS — I615 Nontraumatic intracerebral hemorrhage, intraventricular: Secondary | ICD-10-CM | POA: Diagnosis not present

## 2023-01-06 DIAGNOSIS — I10 Essential (primary) hypertension: Secondary | ICD-10-CM | POA: Diagnosis not present

## 2023-01-06 DIAGNOSIS — G9382 Brain death: Secondary | ICD-10-CM | POA: Diagnosis not present

## 2023-01-06 DIAGNOSIS — E870 Hyperosmolality and hypernatremia: Secondary | ICD-10-CM | POA: Diagnosis present

## 2023-01-06 DIAGNOSIS — I472 Ventricular tachycardia, unspecified: Secondary | ICD-10-CM

## 2023-01-06 DIAGNOSIS — J9601 Acute respiratory failure with hypoxia: Secondary | ICD-10-CM | POA: Diagnosis present

## 2023-01-06 DIAGNOSIS — Z515 Encounter for palliative care: Secondary | ICD-10-CM | POA: Diagnosis not present

## 2023-01-06 DIAGNOSIS — J9600 Acute respiratory failure, unspecified whether with hypoxia or hypercapnia: Secondary | ICD-10-CM

## 2023-01-06 DIAGNOSIS — J9602 Acute respiratory failure with hypercapnia: Secondary | ICD-10-CM | POA: Diagnosis present

## 2023-01-06 DIAGNOSIS — I612 Nontraumatic intracerebral hemorrhage in hemisphere, unspecified: Secondary | ICD-10-CM | POA: Diagnosis not present

## 2023-01-06 DIAGNOSIS — K3189 Other diseases of stomach and duodenum: Secondary | ICD-10-CM | POA: Diagnosis not present

## 2023-01-06 DIAGNOSIS — G936 Cerebral edema: Secondary | ICD-10-CM | POA: Diagnosis present

## 2023-01-06 DIAGNOSIS — R0989 Other specified symptoms and signs involving the circulatory and respiratory systems: Secondary | ICD-10-CM | POA: Diagnosis not present

## 2023-01-06 DIAGNOSIS — K7200 Acute and subacute hepatic failure without coma: Secondary | ICD-10-CM | POA: Diagnosis not present

## 2023-01-06 DIAGNOSIS — Z6830 Body mass index (BMI) 30.0-30.9, adult: Secondary | ICD-10-CM | POA: Diagnosis not present

## 2023-01-06 DIAGNOSIS — I462 Cardiac arrest due to underlying cardiac condition: Secondary | ICD-10-CM | POA: Diagnosis present

## 2023-01-06 DIAGNOSIS — F0390 Unspecified dementia without behavioral disturbance: Secondary | ICD-10-CM | POA: Diagnosis not present

## 2023-01-06 DIAGNOSIS — Z79899 Other long term (current) drug therapy: Secondary | ICD-10-CM

## 2023-01-06 DIAGNOSIS — I159 Secondary hypertension, unspecified: Secondary | ICD-10-CM

## 2023-01-06 DIAGNOSIS — R231 Pallor: Secondary | ICD-10-CM | POA: Diagnosis not present

## 2023-01-06 DIAGNOSIS — I610 Nontraumatic intracerebral hemorrhage in hemisphere, subcortical: Secondary | ICD-10-CM | POA: Diagnosis not present

## 2023-01-06 DIAGNOSIS — R1310 Dysphagia, unspecified: Secondary | ICD-10-CM | POA: Diagnosis not present

## 2023-01-06 DIAGNOSIS — E7800 Pure hypercholesterolemia, unspecified: Secondary | ICD-10-CM | POA: Diagnosis present

## 2023-01-06 DIAGNOSIS — I161 Hypertensive emergency: Secondary | ICD-10-CM | POA: Diagnosis present

## 2023-01-06 DIAGNOSIS — I4510 Unspecified right bundle-branch block: Secondary | ICD-10-CM | POA: Diagnosis present

## 2023-01-06 DIAGNOSIS — R578 Other shock: Secondary | ICD-10-CM | POA: Diagnosis not present

## 2023-01-06 DIAGNOSIS — Z880 Allergy status to penicillin: Secondary | ICD-10-CM

## 2023-01-06 DIAGNOSIS — I469 Cardiac arrest, cause unspecified: Secondary | ICD-10-CM

## 2023-01-06 DIAGNOSIS — R0681 Apnea, not elsewhere classified: Secondary | ICD-10-CM | POA: Diagnosis not present

## 2023-01-06 DIAGNOSIS — G935 Compression of brain: Secondary | ICD-10-CM | POA: Diagnosis not present

## 2023-01-06 DIAGNOSIS — R29730 NIHSS score 30: Secondary | ICD-10-CM | POA: Diagnosis not present

## 2023-01-06 DIAGNOSIS — R739 Hyperglycemia, unspecified: Secondary | ICD-10-CM | POA: Diagnosis not present

## 2023-01-06 DIAGNOSIS — Z8249 Family history of ischemic heart disease and other diseases of the circulatory system: Secondary | ICD-10-CM

## 2023-01-06 DIAGNOSIS — I619 Nontraumatic intracerebral hemorrhage, unspecified: Secondary | ICD-10-CM | POA: Diagnosis not present

## 2023-01-06 DIAGNOSIS — R55 Syncope and collapse: Secondary | ICD-10-CM | POA: Diagnosis not present

## 2023-01-06 DIAGNOSIS — D72829 Elevated white blood cell count, unspecified: Secondary | ICD-10-CM | POA: Diagnosis not present

## 2023-01-06 DIAGNOSIS — E876 Hypokalemia: Secondary | ICD-10-CM | POA: Diagnosis present

## 2023-01-06 DIAGNOSIS — R404 Transient alteration of awareness: Secondary | ICD-10-CM | POA: Diagnosis not present

## 2023-01-06 DIAGNOSIS — Z4682 Encounter for fitting and adjustment of non-vascular catheter: Secondary | ICD-10-CM | POA: Diagnosis not present

## 2023-01-06 DIAGNOSIS — I63522 Cerebral infarction due to unspecified occlusion or stenosis of left anterior cerebral artery: Secondary | ICD-10-CM | POA: Diagnosis not present

## 2023-01-06 DIAGNOSIS — G9389 Other specified disorders of brain: Secondary | ICD-10-CM | POA: Diagnosis not present

## 2023-01-06 DIAGNOSIS — N179 Acute kidney failure, unspecified: Secondary | ICD-10-CM | POA: Diagnosis not present

## 2023-01-06 DIAGNOSIS — I639 Cerebral infarction, unspecified: Secondary | ICD-10-CM | POA: Diagnosis not present

## 2023-01-06 DIAGNOSIS — R930 Abnormal findings on diagnostic imaging of skull and head, not elsewhere classified: Secondary | ICD-10-CM | POA: Diagnosis not present

## 2023-01-06 DIAGNOSIS — G9340 Encephalopathy, unspecified: Secondary | ICD-10-CM | POA: Diagnosis not present

## 2023-01-06 DIAGNOSIS — I6523 Occlusion and stenosis of bilateral carotid arteries: Secondary | ICD-10-CM | POA: Diagnosis not present

## 2023-01-06 DIAGNOSIS — I672 Cerebral atherosclerosis: Secondary | ICD-10-CM | POA: Diagnosis not present

## 2023-01-06 DIAGNOSIS — E669 Obesity, unspecified: Secondary | ICD-10-CM | POA: Diagnosis present

## 2023-01-06 DIAGNOSIS — Z7189 Other specified counseling: Secondary | ICD-10-CM

## 2023-01-06 LAB — LIPID PANEL
Cholesterol: 112 mg/dL (ref 0–200)
LDL Cholesterol: UNDETERMINED mg/dL (ref 0–99)
Triglycerides: 3144 mg/dL — ABNORMAL HIGH (ref <150)
VLDL: UNDETERMINED mg/dL (ref 0–40)

## 2023-01-06 LAB — RAPID URINE DRUG SCREEN, HOSP PERFORMED
Amphetamines: NOT DETECTED
Barbiturates: NOT DETECTED
Benzodiazepines: NOT DETECTED
Cocaine: NOT DETECTED
Opiates: NOT DETECTED
Tetrahydrocannabinol: NOT DETECTED

## 2023-01-06 LAB — CBC WITH DIFFERENTIAL/PLATELET
Abs Immature Granulocytes: 0.07 10*3/uL (ref 0.00–0.07)
Basophils Absolute: 0 10*3/uL (ref 0.0–0.1)
Basophils Relative: 0 %
Eosinophils Absolute: 0 10*3/uL (ref 0.0–0.5)
Eosinophils Relative: 0 %
HCT: 39.7 % (ref 36.0–46.0)
Hemoglobin: 12.7 g/dL (ref 12.0–15.0)
Immature Granulocytes: 0 %
Lymphocytes Relative: 4 %
Lymphs Abs: 0.7 10*3/uL (ref 0.7–4.0)
MCH: 26.9 pg (ref 26.0–34.0)
MCHC: 32 g/dL (ref 30.0–36.0)
MCV: 84.1 fL (ref 80.0–100.0)
Monocytes Absolute: 0.9 10*3/uL (ref 0.1–1.0)
Monocytes Relative: 5 %
Neutro Abs: 16.1 10*3/uL — ABNORMAL HIGH (ref 1.7–7.7)
Neutrophils Relative %: 91 %
Platelets: 282 10*3/uL (ref 150–400)
RBC: 4.72 MIL/uL (ref 3.87–5.11)
RDW: 15.7 % — ABNORMAL HIGH (ref 11.5–15.5)
WBC: 17.8 10*3/uL — ABNORMAL HIGH (ref 4.0–10.5)
nRBC: 0 % (ref 0.0–0.2)

## 2023-01-06 LAB — POCT I-STAT EG7
Acid-base deficit: 5 mmol/L — ABNORMAL HIGH (ref 0.0–2.0)
Bicarbonate: 21.2 mmol/L (ref 20.0–28.0)
Calcium, Ion: 0.81 mmol/L — CL (ref 1.15–1.40)
HCT: 41 % (ref 36.0–46.0)
Hemoglobin: 13.9 g/dL (ref 12.0–15.0)
O2 Saturation: 99 %
Potassium: >8.5 mmol/L (ref 3.5–5.1)
Sodium: 129 mmol/L — ABNORMAL LOW (ref 135–145)
TCO2: 22 mmol/L (ref 22–32)
pCO2, Ven: 40.2 mm[Hg] — ABNORMAL LOW (ref 44–60)
pH, Ven: 7.329 (ref 7.25–7.43)
pO2, Ven: 149 mm[Hg] — ABNORMAL HIGH (ref 32–45)

## 2023-01-06 LAB — COMPREHENSIVE METABOLIC PANEL
ALT: 63 U/L — ABNORMAL HIGH (ref 0–44)
AST: 66 U/L — ABNORMAL HIGH (ref 15–41)
Albumin: 3.1 g/dL — ABNORMAL LOW (ref 3.5–5.0)
Alkaline Phosphatase: 62 U/L (ref 38–126)
Anion gap: 16 — ABNORMAL HIGH (ref 5–15)
BUN: 12 mg/dL (ref 8–23)
CO2: 18 mmol/L — ABNORMAL LOW (ref 22–32)
Calcium: 8 mg/dL — ABNORMAL LOW (ref 8.9–10.3)
Chloride: 102 mmol/L (ref 98–111)
Creatinine, Ser: 1.05 mg/dL — ABNORMAL HIGH (ref 0.44–1.00)
GFR, Estimated: 57 mL/min — ABNORMAL LOW (ref >60)
Glucose, Bld: 315 mg/dL — ABNORMAL HIGH (ref 70–99)
Potassium: 3.3 mmol/L — ABNORMAL LOW (ref 3.5–5.1)
Sodium: 136 mmol/L (ref 135–145)
Total Bilirubin: 0.7 mg/dL (ref <1.2)
Total Protein: 6.9 g/dL (ref 6.5–8.1)

## 2023-01-06 LAB — I-STAT ARTERIAL BLOOD GAS, ED
Acid-base deficit: 2 mmol/L (ref 0.0–2.0)
Bicarbonate: 24 mmol/L (ref 20.0–28.0)
Calcium, Ion: 1.16 mmol/L (ref 1.15–1.40)
HCT: 39 % (ref 36.0–46.0)
Hemoglobin: 13.3 g/dL (ref 12.0–15.0)
O2 Saturation: 100 %
Patient temperature: 98.4
Potassium: 3.1 mmol/L — ABNORMAL LOW (ref 3.5–5.1)
Sodium: 144 mmol/L (ref 135–145)
TCO2: 25 mmol/L (ref 22–32)
pCO2 arterial: 46.1 mm[Hg] (ref 32–48)
pH, Arterial: 7.325 — ABNORMAL LOW (ref 7.35–7.45)
pO2, Arterial: 348 mm[Hg] — ABNORMAL HIGH (ref 83–108)

## 2023-01-06 LAB — POCT I-STAT, CHEM 8
BUN: 19 mg/dL (ref 8–23)
Calcium, Ion: 0.8 mmol/L — CL (ref 1.15–1.40)
Chloride: 107 mmol/L (ref 98–111)
Creatinine, Ser: 1.1 mg/dL — ABNORMAL HIGH (ref 0.44–1.00)
Glucose, Bld: 137 mg/dL — ABNORMAL HIGH (ref 70–99)
HCT: 41 % (ref 36.0–46.0)
Hemoglobin: 13.9 g/dL (ref 12.0–15.0)
Potassium: 8.4 mmol/L (ref 3.5–5.1)
Sodium: 130 mmol/L — ABNORMAL LOW (ref 135–145)
TCO2: 23 mmol/L (ref 22–32)

## 2023-01-06 LAB — PROTIME-INR
INR: 1 (ref 0.8–1.2)
Prothrombin Time: 13.7 s (ref 11.4–15.2)

## 2023-01-06 LAB — HEMOGLOBIN A1C
Hgb A1c MFr Bld: 6 % — ABNORMAL HIGH (ref 4.8–5.6)
Mean Plasma Glucose: 125.5 mg/dL

## 2023-01-06 LAB — URINALYSIS, ROUTINE W REFLEX MICROSCOPIC
Bacteria, UA: NONE SEEN
Bilirubin Urine: NEGATIVE
Glucose, UA: 50 mg/dL — AB
Ketones, ur: NEGATIVE mg/dL
Leukocytes,Ua: NEGATIVE
Nitrite: NEGATIVE
Protein, ur: 100 mg/dL — AB
Specific Gravity, Urine: 1.013 (ref 1.005–1.030)
pH: 7 (ref 5.0–8.0)

## 2023-01-06 LAB — HIV ANTIBODY (ROUTINE TESTING W REFLEX): HIV Screen 4th Generation wRfx: NONREACTIVE

## 2023-01-06 LAB — CG4 I-STAT (LACTIC ACID): Lactic Acid, Venous: 3.7 mmol/L (ref 0.5–1.9)

## 2023-01-06 LAB — APTT: aPTT: 28 s (ref 24–36)

## 2023-01-06 LAB — ETHANOL: Alcohol, Ethyl (B): <10 mg/dL (ref <10)

## 2023-01-06 LAB — SODIUM: Sodium: 143 mmol/L (ref 135–145)

## 2023-01-06 LAB — CBG MONITORING, ED: Glucose-Capillary: 173 mg/dL — ABNORMAL HIGH (ref 70–99)

## 2023-01-06 LAB — LDL CHOLESTEROL, DIRECT: Direct LDL: UNDETERMINED mg/dL (ref 0–99)

## 2023-01-06 MED ORDER — AMIODARONE HCL IN DEXTROSE 360-4.14 MG/200ML-% IV SOLN
30.0000 mg/h | INTRAVENOUS | Status: DC
  Administered 2023-01-07 – 2023-01-08 (×3): 30 mg/h via INTRAVENOUS
  Filled 2023-01-06 (×3): qty 200

## 2023-01-06 MED ORDER — SODIUM CHLORIDE 0.9% FLUSH: 10.0000 mL | INTRAVENOUS | Status: DC | PRN

## 2023-01-06 MED ORDER — PANTOPRAZOLE SODIUM 40 MG IV SOLR
40.0000 mg | Freq: Every day | INTRAVENOUS | Status: DC
  Administered 2023-01-06 – 2023-01-07 (×2): 40 mg via INTRAVENOUS
  Filled 2023-01-06 (×2): qty 10

## 2023-01-06 MED ORDER — AMIODARONE HCL 150 MG/3ML IV SOLN
INTRAVENOUS | Status: AC | PRN
  Administered 2023-01-06: 300 mg via INTRAVENOUS

## 2023-01-06 MED ORDER — EPINEPHRINE 1 MG/10ML IJ SOSY
PREFILLED_SYRINGE | INTRAMUSCULAR | Status: AC | PRN
  Administered 2023-01-06: 1 mg via INTRAVENOUS

## 2023-01-06 MED ORDER — SUCCINYLCHOLINE CHLORIDE 20 MG/ML IJ SOLN
INTRAMUSCULAR | Status: AC | PRN
  Administered 2023-01-06: 140 mg via INTRAVENOUS

## 2023-01-06 MED ORDER — CLEVIDIPINE BUTYRATE 0.5 MG/ML IV EMUL
0.0000 mg/h | INTRAVENOUS | Status: DC
Start: 2023-01-06 — End: 2023-01-08
  Administered 2023-01-06: 2 mg/h via INTRAVENOUS
  Administered 2023-01-07: 10 mg/h via INTRAVENOUS
  Administered 2023-01-07: 8 mg/h via INTRAVENOUS
  Administered 2023-01-07: 10 mg/h via INTRAVENOUS
  Administered 2023-01-07: 8 mg/h via INTRAVENOUS
  Administered 2023-01-07: 16 mg/h via INTRAVENOUS
  Administered 2023-01-07: 18 mg/h via INTRAVENOUS
  Administered 2023-01-07: 10 mg/h via INTRAVENOUS
  Administered 2023-01-07: 8 mg/h via INTRAVENOUS
  Administered 2023-01-07: 10 mg/h via INTRAVENOUS
  Administered 2023-01-07: 8 mg/h via INTRAVENOUS
  Administered 2023-01-08: 20 mg/h via INTRAVENOUS
  Administered 2023-01-08: 17 mg/h via INTRAVENOUS
  Administered 2023-01-08 (×2): 21 mg/h via INTRAVENOUS
  Filled 2023-01-06 (×16): qty 50

## 2023-01-06 MED ORDER — CHLORHEXIDINE GLUCONATE CLOTH 2 % EX PADS
6.0000 | MEDICATED_PAD | Freq: Every day | CUTANEOUS | Status: DC
  Administered 2023-01-06 – 2023-01-08 (×2): 6 via TOPICAL

## 2023-01-06 MED ORDER — STROKE: EARLY STAGES OF RECOVERY BOOK
Freq: Once | Status: AC
  Administered 2023-01-07: 1
  Filled 2023-01-06: qty 1

## 2023-01-06 MED ORDER — AMIODARONE HCL IN DEXTROSE 360-4.14 MG/200ML-% IV SOLN
60.0000 mg/h | INTRAVENOUS | Status: AC
  Administered 2023-01-06 (×2): 60 mg/h via INTRAVENOUS
  Filled 2023-01-06: qty 200

## 2023-01-06 MED ORDER — PROPOFOL 1000 MG/100ML IV EMUL
5.0000 ug/kg/min | INTRAVENOUS | Status: DC
Start: 2023-01-06 — End: 2023-01-08
  Administered 2023-01-06: 30 ug/kg/min via INTRAVENOUS
  Administered 2023-01-06: 15 ug/kg/min via INTRAVENOUS
  Administered 2023-01-07: 20 ug/kg/min via INTRAVENOUS
  Filled 2023-01-06 (×3): qty 100

## 2023-01-06 MED ORDER — ORAL CARE MOUTH RINSE
15.0000 mL | OROMUCOSAL | Status: DC
Start: 2023-01-07 — End: 2023-01-08
  Administered 2023-01-07 – 2023-01-08 (×17): 15 mL via OROMUCOSAL

## 2023-01-06 MED ORDER — SENNOSIDES-DOCUSATE SODIUM 8.6-50 MG PO TABS
1.0000 | ORAL_TABLET | Freq: Two times a day (BID) | ORAL | Status: DC
  Filled 2023-01-06 (×2): qty 1

## 2023-01-06 MED ORDER — ORAL CARE MOUTH RINSE: 15.0000 mL | OROMUCOSAL | Status: DC | PRN

## 2023-01-06 MED ORDER — IOHEXOL 350 MG/ML SOLN
50.0000 mL | Freq: Once | INTRAVENOUS | Status: AC | PRN
  Administered 2023-01-06: 50 mL via INTRAVENOUS

## 2023-01-06 MED ORDER — SODIUM CHLORIDE 0.9% FLUSH
10.0000 mL | Freq: Two times a day (BID) | INTRAVENOUS | Status: DC
  Administered 2023-01-06 – 2023-01-07 (×3): 10 mL

## 2023-01-06 MED ORDER — LABETALOL HCL 5 MG/ML IV SOLN
20.0000 mg | INTRAVENOUS | Status: DC | PRN
  Administered 2023-01-08: 20 mg via INTRAVENOUS
  Filled 2023-01-06: qty 4

## 2023-01-06 MED ORDER — SENNOSIDES-DOCUSATE SODIUM 8.6-50 MG PO TABS
1.0000 | ORAL_TABLET | Freq: Two times a day (BID) | ORAL | Status: DC
Start: 2023-01-06 — End: 2023-01-06

## 2023-01-06 MED ORDER — ACETAMINOPHEN 325 MG PO TABS: 650.0000 mg | ORAL_TABLET | ORAL | Status: DC | PRN

## 2023-01-06 MED ORDER — SODIUM CHLORIDE 3 % IV SOLN
INTRAVENOUS | Status: DC
  Filled 2023-01-06 (×2): qty 500

## 2023-01-06 MED ORDER — ACETAMINOPHEN 650 MG RE SUPP
650.0000 mg | RECTAL | Status: DC | PRN
Start: 2023-01-06 — End: 2023-01-08

## 2023-01-06 MED ORDER — ACETAMINOPHEN 160 MG/5ML PO SOLN
650.0000 mg | ORAL | Status: DC | PRN
Start: 2023-01-06 — End: 2023-01-08
  Administered 2023-01-07 – 2023-01-08 (×3): 650 mg
  Filled 2023-01-06 (×3): qty 20.3

## 2023-01-06 NOTE — Progress Notes (Signed)
RT transported pt from 4N29 to CT-1 and back with RN at bedside. No complications at this time.

## 2023-01-06 NOTE — ED Notes (Signed)
Patient transported to CT 
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
## 2023-01-06 NOTE — Progress Notes (Signed)
Peripherally Inserted Central Catheter Placement  The IV Nurse has discussed with the patient and/or persons authorized to consent for the patient, the purpose of this procedure and the potential benefits and risks involved with this procedure.  The benefits include less needle sticks, lab draws from the catheter, and the patient may be discharged home with the catheter. Risks include, but not limited to, infection, bleeding, blood clot (thrombus formation), and puncture of an artery; nerve damage and irregular heartbeat and possibility to perform a PICC exchange if needed/ordered by physician.  Alternatives to this procedure were also discussed.  Bard Power PICC patient education guide, fact sheet on infection prevention and patient information card has been provided to patient /or left at bedside. Son at bedside gave consent   PICC Placement Documentation  PICC Triple Lumen 01/01/2023 Right Basilic 37 cm 1 cm (Active)  Indication for Insertion or Continuance of Line Vasoactive infusions 01/11/2023 1740  Exposed Catheter (cm) 1 cm 01/09/2023 1740  Site Assessment Clean, Dry, Intact 12/31/2022 1740  Lumen #1 Status Flushed;Saline locked;Blood return noted 12/25/2022 1740  Lumen #2 Status Flushed;Saline locked;Blood return noted 12/30/2022 1740  Lumen #3 Status Flushed;Saline locked;Blood return noted 12/24/2022 1740  Dressing Type Transparent;Securing device 01/13/2023 1740  Dressing Status Antimicrobial disc in place;Clean, Dry, Intact 12/29/2022 1740  Line Care Connections checked and tightened 12/29/2022 1740  Line Adjustment (NICU/IV Team Only) No 01/01/2023 1740  Dressing Intervention New dressing;Adhesive placed at insertion site (IV team only);Adhesive placed around edges of dressing (IV team/ICU RN only) 01/13/2023 1740  Dressing Change Due 01/13/23 12/22/2022 1740       Meghan Nichols 01/05/2023, 5:42 PM

## 2023-01-06 NOTE — Code Documentation (Signed)
Unsynchronized shock at 200 j by Dr. Elayne Snare

## 2023-01-06 NOTE — Code Documentation (Signed)
Pulse check - no pulse- continued compressions.

## 2023-01-06 NOTE — Progress Notes (Signed)
Same day progress note  Called by RN to speak with family who had questions.  Ordered repeat head CT and reviewed it Repeat head CT shows worsening of bleed both ICH and iVH and worsening MLS and cerebral edema. Possible entrapment of right temporal horn. No definite downward herniation yet.  Currently on HTS and BP management with goal BP presently.  Exam: no eye opening to voice or nox stim,  asymmetric pupils (left 1mm, right 3mm) both non reactive. Absent corneals. On some sedation with prop.  Imp: non traumatic ICH, with significant midline shift and cerebral edema with brain compression  Recs: Continue current course - with HTS and BP management Neurosurgical consult per H&P - deemed non operable. Remains critical - will try to reduce sedation as much as possible and get repeat exams. Family told of the grim prognosis given ICH score 4 on arrival and current exam but also told that next 24-48 hrs remain very critical and she may not survive that time period. Code status discussed and family agreed to DNR at this time. Will update code status in chart.  -- Milon Dikes, MD Neurologist [facility]  Additional 40 min cc time in family meeting, imaging review, chart review and documentation.

## 2023-01-06 NOTE — Progress Notes (Signed)
Transported to CT scan and back to [facility].

## 2023-01-06 NOTE — Code Documentation (Signed)
Pulses returned

## 2023-01-06 NOTE — Progress Notes (Signed)
Page by nurse for family support. Son Gus at bedside and expresses asking God for a miracle. He performs life review and welcomes prayer.

## 2023-01-06 NOTE — Consult Note (Signed)
NAME:  Meghan Nichols, MRN:  [id number], DOB:  1951/01/16, LOS: 0 ADMISSION DATE:  01/01/2023, CONSULTATION DATE:  12/30/2022 REFERRING MD: Otelia Limes, CHIEF COMPLAINT: ICH  History of Present Illness:  72 year old woman with found unresponsive in her vehicle.  Brought to ED still unresponsive of blood pressures in the 200s.  CT scan revealed large left cerebral hemispheric intracranial hemorrhage with brain compression.   Pertinent  Medical History   Past Medical History:  Diagnosis Date   High cholesterol    Hypertension    Past Surgical History:  Procedure Laterality Date   ABDOMINAL HYSTERECTOMY     No current facility-administered medications on file prior to encounter.   Current Outpatient Medications on File Prior to Encounter  Medication Sig Dispense Refill   Ascorbic Acid (VITAMIN C) 100 MG tablet Take 100 mg by mouth daily.     azelastine (ASTELIN) 0.1 % nasal spray Place 2 sprays into both nostrils 2 (two) times daily. Use in each nostril as directed 30 mL 3   cholecalciferol (VITAMIN D) 1000 UNITS tablet Take 1,000 Units by mouth daily.     fexofenadine (ALLEGRA) 180 MG tablet TAKE 1 TABLET BY MOUTH EVERY DAY 90 tablet 3   KLOR-CON M20 20 MEQ tablet TAKE 1 TABLET BY MOUTH TWICE A DAY 180 tablet 2   losartan (COZAAR) 50 MG tablet Take 1 tablet (50 mg total) by mouth daily. 90 tablet 3   metoprolol succinate (TOPROL-XL) 100 MG 24 hr tablet TAKE 1 TABLET EVERY DAY WITH A MEAL 90 tablet 2   pravastatin (PRAVACHOL) 40 MG tablet TAKE 1 TABLET BY MOUTH EVERY DAY 90 tablet 3   spironolactone (ALDACTONE) 25 MG tablet TAKE 1 TABLET (25 MG TOTAL) BY MOUTH DAILY. 90 tablet 3   traMADol (ULTRAM) 50 MG tablet Take 1 tablet (50 mg total) by mouth daily as needed for moderate pain. 30 tablet 2   VITAMIN E PO Take 1 tablet by mouth daily.     Unable to complete review of systems due to mental status.  Chart review suggest that the patient was previously relatively healthy with visits  largely for orthopedic problems and hypertension.  Significant Hospital Events: Including procedures, antibiotic start and stop dates in addition to other pertinent events   11/17-admitted to neuro ICU.  CT scan shows large ICH left basal ganglia with extension into the lateral ventricles with compression of the midbrain  Interim History / Subjective:  Brief cardiac arrest while in ED.  Started on Cleviprex for blood pressure.  Objective   Blood pressure (!) 162/99, pulse 72, temperature (!) 97.4 F (36.3 C), resp. rate 15, height 5\' 6"  (1.676 m), weight 85 kg, SpO2 100%.    Vent Mode: PRVC FiO2 (%):  [100 %] 100 % Set Rate:  [18 bmp-22 bmp] 22 bmp Vt Set:  [480 mL] 480 mL PEEP:  [8 cmH20] 8 cmH20 Plateau Pressure:  [20 cmH20] 20 cmH20   Intake/Output Summary (Last 24 hours) at 01/05/2023 1521 Last data filed at 01/14/2023 1517 Gross per 24 hour  Intake 53.02 ml  Output --  Net 53.02 ml   Filed Weights   01/12/2023 1422  Weight: 85 kg    Examination: General: Appears stated age, overweight HENT: Orally intubated.  Thyroid normal size and consistency. Lungs: Chest clear to auscultation bilaterally.  Anterior chest wall bruising from CPR. Cardiovascular: Hypertensive with systolics in 180's.  Heart sounds are unremarkable.  There is no peripheral edema. Abdomen: Soft and nontender Extremities: No limb  trauma. Neuro: Pupils 3 mm and unreactive. slight gag and cough.  Flexor posturing in the upper extremities extensor posturing in the lower extremities bilateral upgoing toes. GU: Deferred.  Ancillary test personally reviewed:  CT scan shows left basal ganglia hemorrhage with an estimated volume of 64 mL.  Intraventricular hemorrhage with 11 mm of midline shift and mass effect on the tentorium.  No hemodynamic significant narrowings or evidence of vascular malformation. Chest x-ray shows clear lung fields with ET tube in good position. ABG acceptable with pH 732/46/348,  hemoglobin 13.3. Assessment & Plan:   Critically ill due to massive left basal ganglia ICH with intraventricular extension secondary to hypertension. Hypertensive emergency Brain compression with downward herniation. ICH score of 4 predicts 97% mortality. Hypertension Dyslipidemia Status post VT cardiac arrest  Plan:  -Full mechanical ventilatory support aiming for normal pH. -Titrate Cleviprex to keep systolic blood pressure 130-150 -Continue low-dose propofol to avoid blood pressure spikes from coughing. -Start 3% saline per protocol to target sodium 150-155. -At high risk for progression to death by neurological criteria.  Follow serial examination. -Will need to address goals of care with family when they arrive. -Continue amiodarone for arrhythmia prevention. -Hold home antihypertensive and antilipid therapies.  Best Practice (right click and "Reselect all SmartList Selections" daily)   Diet/type: NPO w/ meds via tube DVT prophylaxis: SCD GI prophylaxis: PPI Lines: N/A Foley:  Yes, and it is still needed Code Status:  full code Last date of multidisciplinary goals of care discussion [pending.  Have yet to speak with family.]   CRITICAL CARE Performed by: Lynnell Catalan   Total critical care time: 35 minutes  Critical care time was exclusive of separately billable procedures and treating other patients.  Critical care was necessary to treat or prevent imminent or life-threatening deterioration.  Critical care was time spent personally by me on the following activities: development of treatment plan with patient and/or surrogate as well as nursing, discussions with consultants, evaluation of patient's response to treatment, examination of patient, obtaining history from patient or surrogate, ordering and performing treatments and interventions, ordering and review of laboratory studies, ordering and review of radiographic studies, pulse oximetry, re-evaluation of  patient's condition and participation in multidisciplinary rounds.  Lynnell Catalan, MD [facility] ICU Physician [facility]  Pager: [phone] Mobile: [phone] After hours: [phone].

## 2023-01-07 ENCOUNTER — Encounter: Payer: Self-pay | Admitting: Acute Care

## 2023-01-07 ENCOUNTER — Inpatient Hospital Stay (HOSPITAL_COMMUNITY): Payer: Medicare Other

## 2023-01-07 DIAGNOSIS — I469 Cardiac arrest, cause unspecified: Secondary | ICD-10-CM

## 2023-01-07 DIAGNOSIS — I619 Nontraumatic intracerebral hemorrhage, unspecified: Principal | ICD-10-CM

## 2023-01-07 DIAGNOSIS — I472 Ventricular tachycardia, unspecified: Secondary | ICD-10-CM

## 2023-01-07 DIAGNOSIS — I6389 Other cerebral infarction: Secondary | ICD-10-CM | POA: Diagnosis not present

## 2023-01-07 DIAGNOSIS — Z7189 Other specified counseling: Secondary | ICD-10-CM

## 2023-01-07 DIAGNOSIS — G935 Compression of brain: Secondary | ICD-10-CM

## 2023-01-07 DIAGNOSIS — J9600 Acute respiratory failure, unspecified whether with hypoxia or hypercapnia: Secondary | ICD-10-CM

## 2023-01-07 DIAGNOSIS — I610 Nontraumatic intracerebral hemorrhage in hemisphere, subcortical: Secondary | ICD-10-CM | POA: Diagnosis not present

## 2023-01-07 DIAGNOSIS — Z66 Do not resuscitate: Secondary | ICD-10-CM

## 2023-01-07 LAB — MAGNESIUM
Magnesium: 1.8 mg/dL (ref 1.7–2.4)
Magnesium: 2.5 mg/dL — ABNORMAL HIGH (ref 1.7–2.4)

## 2023-01-07 LAB — BASIC METABOLIC PANEL
Anion gap: 14 (ref 5–15)
Anion gap: 9 (ref 5–15)
BUN: 14 mg/dL (ref 8–23)
BUN: 15 mg/dL (ref 8–23)
CO2: 19 mmol/L — ABNORMAL LOW (ref 22–32)
CO2: 21 mmol/L — ABNORMAL LOW (ref 22–32)
Calcium: 8.9 mg/dL (ref 8.9–10.3)
Calcium: 8.5 mg/dL — ABNORMAL LOW (ref 8.9–10.3)
Chloride: 110 mmol/L (ref 98–111)
Chloride: 122 mmol/L — ABNORMAL HIGH (ref 98–111)
Creatinine, Ser: 1.22 mg/dL — ABNORMAL HIGH (ref 0.44–1.00)
Creatinine, Ser: 1.36 mg/dL — ABNORMAL HIGH (ref 0.44–1.00)
GFR, Estimated: 48 mL/min — ABNORMAL LOW (ref >60)
GFR, Estimated: 42 mL/min — ABNORMAL LOW (ref >60)
Glucose, Bld: 192 mg/dL — ABNORMAL HIGH (ref 70–99)
Glucose, Bld: 179 mg/dL — ABNORMAL HIGH (ref 70–99)
Potassium: 3 mmol/L — ABNORMAL LOW (ref 3.5–5.1)
Potassium: 4.1 mmol/L (ref 3.5–5.1)
Sodium: 143 mmol/L (ref 135–145)
Sodium: 152 mmol/L — ABNORMAL HIGH (ref 135–145)

## 2023-01-07 LAB — CBC
HCT: 40.8 % (ref 36.0–46.0)
Hemoglobin: 13 g/dL (ref 12.0–15.0)
MCH: 26.7 pg (ref 26.0–34.0)
MCHC: 31.9 g/dL (ref 30.0–36.0)
MCV: 83.8 fL (ref 80.0–100.0)
Platelets: 282 10*3/uL (ref 150–400)
RBC: 4.87 MIL/uL (ref 3.87–5.11)
RDW: 15.9 % — ABNORMAL HIGH (ref 11.5–15.5)
WBC: 27 10*3/uL — ABNORMAL HIGH (ref 4.0–10.5)
nRBC: 0 % (ref 0.0–0.2)

## 2023-01-07 LAB — ECHOCARDIOGRAM COMPLETE
AR max vel: 1.63 cm2
AV Area VTI: 1.53 cm2
AV Area mean vel: 1.59 cm2
AV Mean grad: 7 mm[Hg]
AV Peak grad: 14.6 mm[Hg]
Ao pk vel: 1.91 m/s
Area-P 1/2: 3.48 cm2
Est EF: >75
Height: 66 in
S' Lateral: 2.7 cm
Weight: 2998.26 [oz_av]

## 2023-01-07 LAB — TRIGLYCERIDES: Triglycerides: 193 mg/dL — ABNORMAL HIGH (ref <150)

## 2023-01-07 LAB — PHOSPHORUS
Phosphorus: <1 mg/dL — CL (ref 2.5–4.6)
Phosphorus: 3.2 mg/dL (ref 2.5–4.6)

## 2023-01-07 LAB — SODIUM
Sodium: 148 mmol/L — ABNORMAL HIGH (ref 135–145)
Sodium: 159 mmol/L — ABNORMAL HIGH (ref 135–145)

## 2023-01-07 MED ORDER — SODIUM CHLORIDE 0.9 % IV SOLN
2.0000 g | INTRAVENOUS | Status: DC
  Administered 2023-01-07: 2 g via INTRAVENOUS
  Filled 2023-01-07: qty 20

## 2023-01-07 MED ORDER — SENNOSIDES-DOCUSATE SODIUM 8.6-50 MG PO TABS: 1.0000 | ORAL_TABLET | Freq: Two times a day (BID) | ORAL | Status: DC

## 2023-01-07 MED ORDER — POTASSIUM PHOSPHATES 15 MMOLE/5ML IV SOLN
45.0000 mmol | Freq: Once | INTRAVENOUS | Status: AC
  Administered 2023-01-07: 45 mmol via INTRAVENOUS
  Filled 2023-01-07: qty 15

## 2023-01-07 MED ORDER — SODIUM CHLORIDE 3 % IV BOLUS
250.0000 mL | Freq: Once | INTRAVENOUS | Status: AC
  Administered 2023-01-07: 250 mL via INTRAVENOUS
  Filled 2023-01-07: qty 500

## 2023-01-07 MED ORDER — SENNOSIDES-DOCUSATE SODIUM 8.6-50 MG PO TABS
1.0000 | ORAL_TABLET | Freq: Two times a day (BID) | ORAL | Status: DC
  Administered 2023-01-07 (×2): 1
  Filled 2023-01-07: qty 1

## 2023-01-07 MED ORDER — MAGNESIUM SULFATE 2 GM/50ML IV SOLN
2.0000 g | Freq: Once | INTRAVENOUS | Status: AC
  Administered 2023-01-07: 2 g via INTRAVENOUS
  Filled 2023-01-07: qty 50

## 2023-01-07 MED ORDER — LOSARTAN POTASSIUM 50 MG PO TABS
50.0000 mg | ORAL_TABLET | Freq: Every day | ORAL | Status: DC
  Administered 2023-01-07: 50 mg
  Filled 2023-01-07: qty 1

## 2023-01-07 MED ORDER — METOPROLOL TARTRATE 25 MG PO TABS
25.0000 mg | ORAL_TABLET | Freq: Two times a day (BID) | ORAL | Status: DC
  Administered 2023-01-07 (×2): 25 mg
  Filled 2023-01-07 (×2): qty 1

## 2023-01-07 NOTE — IPAL (Addendum)
  Interdisciplinary Goals of Care Family Meeting   Date carried out: 01/07/2023  Location of the meeting: Bedside  Member's involved: Nurse Practitioner, Bedside Registered Nurse, and Family Member or next of kin  Durable Power of Attorney or acting medical decision maker: Possibly husband   vs son   Discussion: We discussed goals of care for [employer] .    Several family members including son and granddaughter at bedside. Pt spouse not present, and has been unreachable by phone since he left mid-morning.   Son brought pt paperwork -- unfortunately this is not POA paperwork, but is a last will and testament.  Question becomes who POA is-- legally may still be spouse (separated, but not yet divorced) but there is question from his son about competence with reported hx dementia.    All family members present at bedside feel that transition to comfort care when remaining pertinent parties have had a chance to say goodbye is more congruent with pt values than trach/peg/facility. Present family is very worried about the pt spouse potentially trying to make unilateral decisions and about some underlying family dynamics. In my talk with spouse, I think he understood severity of current situation and thanked me several times for being honest and direct about her poor prognosis -- he did not indicate at that time what his thoughts about direction of care are (nor did I ask as we were awaiting paperwork that was reported to [place] as being POA paperwork...) Exam remains very poor-- I did again relay concern that pt may progress to brain death & present family understands this. I also explained this to spouse this morning when he was present.   I am hopeful that if we are able to reach the spouse, a collaborative decision may be reached to transition to comfort care. If we are unable to reach spouse in coming day(s) then question would be at which time is it appropriate to defer to available surrogate  (son). More complicated would be if we cannot reach a collaborative decision, or if question of competence is further founded     If spouse returns call or returns to bedside, please express importance of having cell phone available and try to firm up a time that he is returning to hospital 11/19.   Code status:   Code Status: Do not attempt resuscitation (DNR) PRE-ARREST INTERVENTIONS DESIRED   Disposition: Continue current acute care  Time spent for the meeting: cct 27 min     Lanier Clam, NP  01/07/2023, 5:20 PM

## 2023-01-07 NOTE — Progress Notes (Signed)
Na 159 at 2037. Stopping 3% sodium chloride gtt per Dr. Amada Jupiter.

## 2023-01-07 NOTE — Progress Notes (Signed)
Pt noted to have 5 beats Vtach at 1017. "Ok to hold off on EKG d/t pt's electrolyte imbalances (K+/phos)", per Dr. Delton Coombes

## 2023-01-07 NOTE — Progress Notes (Signed)
After pt's grand-daughter Samul Dada inquired about the form gathered. I reached out to Zenaida Deed., RN (transition of care) for assist w/ the help of Charge nurse Amy, RN. During my conversation w/ Bronwen Betters, PA was at bedside explaining the dynamics of the form and the pt's care.

## 2023-01-07 NOTE — Progress Notes (Signed)
OT Cancellation Note  Patient Details Name: Meghan Nichols MRN: [id number] DOB: Jan 16, 1951   Cancelled Treatment:    Reason Eval/Treat Not Completed: Medical issues which prohibited therapy. Pt remains intubated with active bedrest orders. Will follow up at later date/time when pt medically stable and schedule allows.  Shakema Surita M Rian Koon Kelsea Mousel MSOT, OTR/L Acute Rehab Office: [phone] 01/07/2023, 10:52 AM

## 2023-01-07 NOTE — IPAL (Addendum)
  Interdisciplinary Goals of Care Family Meeting   Date carried out: 01/07/2023  Location of the meeting: Bedside  Member's involved: Nurse Practitioner, Bedside Registered Nurse, and Family Member or next of kin  Durable Power of Attorney or acting medical decision maker:  Unclear pending arrival of legal paperwork  Is separated but not divorced from husband Reportedly recently drew up POA forms, sounds like she has indicated Son, but we do not have this paperwork yet     Discussion: We discussed goals of care for [employer] .  Husband and granddaughter present. Answered questions regarding her status. Discussed broadly GOC, clarified that family is trying to obtain her paperwork today if landlord will allow access to her apartment   Spoke with son on the phone after this, answered questions and discussed broad GOC with plan to revisit when paperwork is obtained. It is sons belief however that pt would not want measures like trach/peg, and more than likely I think we will have to discuss transition to comfort care if that it the case   Code status:   Code Status: Do not attempt resuscitation (DNR) PRE-ARREST INTERVENTIONS DESIRED   Disposition: Continue current acute care  Time spent for the meeting:     Lanier Clam, NP  01/07/2023, 9:26 AM

## 2023-01-07 NOTE — Progress Notes (Signed)
NAME:  Meghan Nichols, MRN:  [id number], DOB:  1951/02/15, LOS: 1 ADMISSION DATE:  01/19/2023, CONSULTATION DATE:  01/14/2023 REFERRING MD: Otelia Limes, CHIEF COMPLAINT: ICH  History of Present Illness:  72 year old woman with found unresponsive in her vehicle.  Brought to ED still unresponsive of blood pressures in the 200s.  CT scan revealed large left cerebral hemispheric intracranial hemorrhage with brain compression.   Pertinent  Medical History   Past Medical History:  Diagnosis Date   High cholesterol    Hypertension    Past Surgical History:  Procedure Laterality Date   ABDOMINAL HYSTERECTOMY     No current facility-administered medications on file prior to encounter.   Current Outpatient Medications on File Prior to Encounter  Medication Sig Dispense Refill   losartan (COZAAR) 50 MG tablet Take 1 tablet (50 mg total) by mouth daily. 90 tablet 3   pravastatin (PRAVACHOL) 40 MG tablet TAKE 1 TABLET BY MOUTH EVERY DAY 90 tablet 3   Ascorbic Acid (VITAMIN C) 100 MG tablet Take 100 mg by mouth daily.     azelastine (ASTELIN) 0.1 % nasal spray Place 2 sprays into both nostrils 2 (two) times daily. Use in each nostril as directed 30 mL 3   brimonidine (ALPHAGAN P) 0.1 % SOLN Apply 1 drop to eye 3 (three) times daily.     cholecalciferol (VITAMIN D) 1000 UNITS tablet Take 1,000 Units by mouth daily.     fexofenadine (ALLEGRA) 180 MG tablet TAKE 1 TABLET BY MOUTH EVERY DAY 90 tablet 3   KLOR-CON M20 20 MEQ tablet TAKE 1 TABLET BY MOUTH TWICE A DAY 180 tablet 2   metoprolol succinate (TOPROL-XL) 100 MG 24 hr tablet TAKE 1 TABLET EVERY DAY WITH A MEAL 90 tablet 2   spironolactone (ALDACTONE) 25 MG tablet TAKE 1 TABLET (25 MG TOTAL) BY MOUTH DAILY. 90 tablet 3   traMADol (ULTRAM) 50 MG tablet Take 1 tablet (50 mg total) by mouth daily as needed for moderate pain. 30 tablet 2   VITAMIN E PO Take 1 tablet by mouth daily.     Unable to complete review of systems due to mental status.   Chart review suggest that the patient was previously relatively healthy with visits largely for orthopedic problems and hypertension.  Significant Hospital Events: Including procedures, antibiotic start and stop dates in addition to other pertinent events   11/17-admitted to neuro ICU.  CT scan shows large ICH left basal ganglia with extension into the lateral ventricles with compression of the midbrain. PICC placed, HTS. Worse imaging on repeat scan, made DNR  11/18 trying to obtain POA paperwork. HTS bolus, incr rate   Interim History / Subjective:  Imaging overnight -- worse ICH MLS and edema   Starting to have low grade fevers  Na remains subtherapeutic   Objective   Blood pressure (!) 150/55, pulse 81, temperature 100 F (37.8 C), resp. rate (!) 30, height 5\' 6"  (1.676 m), weight 85 kg, SpO2 100%.    Vent Mode: PRVC FiO2 (%):  [40 %-100 %] 40 % Set Rate:  [18 bmp-24 bmp] 24 bmp Vt Set:  [480 mL] 480 mL PEEP:  [5 cmH20-8 cmH20] 5 cmH20 Plateau Pressure:  [6 cmH20-20 cmH20] 16 cmH20   Intake/Output Summary (Last 24 hours) at 01/07/2023 0941 Last data filed at 01/07/2023 0900 Gross per 24 hour  Intake 1636.08 ml  Output 1450 ml  Net 186.08 ml   Filed Weights   01/10/2023 1422  Weight: 85 kg  Examination: General: Critically ill elderly F NAD  Neuro: off sedation. Pupils are fixed --L pupil 4mm R pupil 3mm. Overbreathes vent. No cough gag corneals. RLE flexion to pain  HENT: NCAT pink mm ETT secure  Lungs: Mechanically ventilated, clear  Cardiovascular:  s1s2 no rgm Abdomen: soft ndnt  Extremities:  no acute joint deformity. BLE SCD GU: foley  Ancillary test personally reviewed:  CT H 11/17 ICH, IVH, cerebral edema and MLS   Repeat CT H 11/17 night with worsening ICH and IVH, worsening midline shift  Assessment & Plan:   GOC DNR status -poor prognosis for meaningful survival  -pt son and granddaughter are trying to obtain her POA paperwork (is separated from  husband) and plan is to continue GOC talks at that time   L BG ICH with intraventricular extension Brain compression; midline shift, cerebral edema -ICH score 4 = 97% mortality P -NSGY has eval, there is not a surgical intervention for this -grim prognosis  -HTS goal Na 150-155, has PICC -- d/w neuro-- giving 250 cc bolus then incr rate to 75/hr  -serial Na -sedation is off as of 11/18 AM. Remains available if needed for vent etc but favor minimal/no sedation as able for neuro prognostication   Acute respiratory failure  -obtunded, cardiac arrest P -cont full MV support -weaning will be limited by neuro status   VT arrest -brief VT arrest in ED approx  P -supportive care -optimize lytes  -amio   HTN emergency HTN HLD  P -clevi for SBP 130-150  -restart her home losartan and starting 25mg  metop BID (home is on 100 xr-- we can always incr her [facility] dosing here if needed)   AKI  NAGMA  Lactic acidosis   Hypokalemia  Hypophosphatemia  Hypomagnesemia  -still w subtherapeutic Na despite HTS P -give 2g mag then Kphos  -recheck lytes 11/18 afternoon and again 11/19 AM   Elevated LFTs  P -follow PRN   Leukocytosis -- reactive/central vs infection  Low grade fever -favor this to be reactive/central given amount of blood in brain. CXR after intubation without obvious infiltrate but was aspiration risk given hx .   P - supportive care -will start rocephin in event that there is a component of aspiration --> infection.  (PCN allergy so unasyn deferred)   Hyperglycemia Prediabetes  P -SSI    Best Practice (right click and "Reselect all SmartList Selections" daily)   Diet/type: NPO w/ meds via tube DVT prophylaxis: SCD GI prophylaxis: PPI Lines: N/A Foley:  Yes, and it is still needed Code Status:  full code Last date of multidisciplinary goals of care discussion [pending.  Have yet to speak with family.]   CRITICAL CARE Performed by: Lanier Clam   Total critical care time: 47 minutes  Critical care time was exclusive of separately billable procedures and treating other patients. Critical care was necessary to treat or prevent imminent or life-threatening deterioration.  Critical care was time spent personally by me on the following activities: development of treatment plan with patient and/or surrogate as well as nursing, discussions with consultants, evaluation of patient's response to treatment, examination of patient, obtaining history from patient or surrogate, ordering and performing treatments and interventions, ordering and review of laboratory studies, ordering and review of radiographic studies, pulse oximetry and re-evaluation of patient's condition.  Tessie Fass MSN, AGACNP-BC [facility] Pulmonary/Critical Care Medicine Amion for pager  01/07/2023, 9:41 AM

## 2023-01-07 NOTE — Plan of Care (Signed)
  Problem: Intracerebral Hemorrhage Tissue Perfusion: Goal: Complications of Intracerebral Hemorrhage will be minimized 01/07/2023 1220 by Ronalee Red, RN Outcome: Progressing 01/07/2023 1219 by Ronalee Red, RN Outcome: Progressing   Problem: Health Behavior/Discharge Planning: Goal: Goals will be collaboratively established with patient/family 01/07/2023 1220 by Ronalee Red, RN Outcome: Progressing 01/07/2023 1219 by Ronalee Red, RN Outcome: Progressing

## 2023-01-07 NOTE — Progress Notes (Signed)
Pt's spouse tried again to be reached by several phone numbers provided via contacts and from pt's son. Neither phone rendered a response.

## 2023-01-07 NOTE — Progress Notes (Signed)
Echocardiogram 2D Echocardiogram has been performed.  Meghan Nichols 01/07/2023, 2:37 PM

## 2023-01-07 NOTE — Progress Notes (Signed)
Pt noted to have increased urine output. [place]rum, PA informed as requested.

## 2023-01-07 NOTE — Progress Notes (Signed)
Dr. Pearlean Brownie informed of son and grand-daughter agreeing to 1500 family meeting today and also that pt's spouse has not been able to be reached since he left this morning.

## 2023-01-07 NOTE — Inpatient Diabetes Management (Signed)
Inpatient Diabetes Program Recommendations  AACE/ADA: New Consensus Statement on Inpatient Glycemic Control (2015)  Target Ranges:  Prepandial:   less than 140 mg/dL      Peak postprandial:   less than 180 mg/dL (1-2 hours)      Critically ill patients:  140 - 180 mg/dL   Lab Results  Component Value Date   GLUCAP 173 (H) 01/02/2023   HGBA1C 6.0 (H) 01/11/2023    Review of Glycemic Control  Latest Reference Range & Units 01/07/2023 13:55  Glucose-Capillary 70 - 99 mg/dL 540 (H)  (H): Data is abnormally high Diabetes history: PreDM Outpatient Diabetes medications: none Current orders for Inpatient glycemic control: none  Inpatient Diabetes Program Recommendations:    If appropriate, consider adding Novolog 2-6 units Q4H.   Thanks, Lujean Rave, MSN, RNC-OB Diabetes Coordinator [phone] (8a-5p)

## 2023-01-07 NOTE — Progress Notes (Signed)
On the phone w/ pt's son and granddaughter at 47 until 34 w/ multiple attempts to get in contact w/ pt's spouse. Spouse informed of Dr. Marlis Edelson plan to have a family meeting today following spouse's appointment. My reason for calling son and grand-daughter, were to informed them of this meeting and gather a good time to let Dr. Pearlean Brownie know, which they would like the meeting to be at 1500 d/t gathering paperwork and son having to travel here from out of town. I will attempt once more to get in contact w/ Meghan Nichols in hopes of getting a good time that correlates w/ 1500 for the meeting w/ Dr. Pearlean Brownie.

## 2023-01-07 NOTE — Progress Notes (Signed)
eLink Physician-Brief Progress Note Patient Name: Myona Obando DOB: 03-Nov-1950 MRN: [id number]   Date of Service  01/07/2023  HPI/Events of Note  Electrolytes imbalance K 3, Po4 < 1 Mag 1.8 Cr 1.2   eICU Interventions  Replacement protocol ordered     Intervention Category Intermediate Interventions: Electrolyte abnormality - evaluation and management  Ranee Gosselin 01/07/2023, 4:33 AM

## 2023-01-07 NOTE — Progress Notes (Signed)
Pharmacy Antibiotic Note  Meghan Nichols is a 72 y.o. female admitted on 01/11/2023 with ICH. Found to have CAP.  Pharmacy has been consulted for ceftriaxone dosing.  Plan: Start  Ceftriaxone 2000 mg q24h   Height: 5\' 6"  (167.6 cm) Weight: 85 kg (187 lb 6.3 oz) IBW/kg (Calculated) : 59.3  Temp (24hrs), Avg:98.9 F (37.2 C), Min:96.1 F (35.6 C), Max:100.9 F (38.3 C)  Recent Labs  Lab 01/15/2023 1645 01/09/2023 1857 01/07/23 0245  WBC  --  17.8* 27.0*  CREATININE 1.10* 1.05* 1.22*  LATICACIDVEN 3.7*  --   --     Estimated Creatinine Clearance: 47.1 mL/min (A) (by C-G formula based on SCr of 1.22 mg/dL (H)).    Allergies  Allergen Reactions   Penicillins Hives    Has patient had a PCN reaction causing immediate rash, facial/tongue/throat swelling, SOB or lightheadedness with hypotension: Yes Has patient had a PCN reaction causing severe rash involving mucus membranes or skin necrosis: Unknown Has patient had a PCN reaction that required hospitalization: No Has patient had a PCN reaction occurring within the last 10 years: No If all of the above answers are "NO", then may proceed with Cephalosporin use.     Antimicrobials this admission: 1/18 Ceftriaxone >>  Dose adjustments this admission: N/a  Microbiology results: none  Thank you for allowing pharmacy to be a part of this patient's care.  Cedric Fishman 01/07/2023 10:26 AM

## 2023-01-07 NOTE — Plan of Care (Signed)
  Problem: Intracerebral Hemorrhage Tissue Perfusion: Goal: Complications of Intracerebral Hemorrhage will be minimized Outcome: Progressing   Problem: Health Behavior/Discharge Planning: Goal: Goals will be collaboratively established with patient/family Outcome: Progressing

## 2023-01-07 NOTE — Progress Notes (Signed)
I have personally tried to reach spouse via phone this evening via phone -- 1 listed number does not have VM set up, other listed number has a VM that I do not think is appropriate to leave message on as there is just a beep, no indication of phone's user (my concern is that this  number --"home"-- may not be accurate)      Tessie Fass MSN, AGACNP-BC [facility] Pulmonary/Critical Care Medicine 01/07/2023, 5:24 PM

## 2023-01-07 NOTE — Progress Notes (Signed)
SLP Cancellation Note  Patient Details Name: Meghan Nichols MRN: [id number] DOB: 1950/08/29   Cancelled treatment:       Reason Eval/Treat Not Completed: Patient not medically ready (on vent). Will continue following.    Gwynneth Aliment, M.A., CF-SLP Speech Language Pathology, Acute Rehabilitation Services  Secure Chat preferred [phone]  01/07/2023, 9:44 AM

## 2023-01-07 NOTE — Progress Notes (Signed)
PT Cancellation Note  Patient Details Name: Meghan Nichols MRN: [id number] DOB: 1950/04/21   Cancelled Treatment:    Reason Eval/Treat Not Completed: Active bedrest order;Patient not medically ready (pt remains intubated with active bedrest orders. Will follow up at later date/time when pt medically ready and schedule allows.)   Renaldo Fiddler PT, DPT Acute Rehabilitation Services Office [phone]  01/07/23 9:46 AM

## 2023-01-07 NOTE — Progress Notes (Addendum)
STROKE TEAM PROGRESS NOTE   BRIEF HPI [name] is a 72 y.o. female with history of hypertension and hyperlipidemia presenting after being found unresponsive in her vehicle.  She was noted to be quite hypertensive on arrival to the ED with systolic blood pressure in the 200s, and was coded for an abnormal rhythm of V. tach and intubated.  CT head reveals large left cerebral hemisphere ICH with IVH and ICH score of 4.  Midline shift and mass effect upon right thalamus and midbrain are present and have increased.  Neurosurgery was consulted and has determined this patient is not a surgical candidate.  Neurological exam is poor, and patient has been made a DNR.  Goals of care conversations are ongoing with family with likely transition to comfort care.  SIGNIFICANT HOSPITAL EVENTS 11/17-admitted to ICU with large left ICH, ICH score 4  INTERIM HISTORY/SUBJECTIVE Patient requires Cleviprex to maintain blood pressure within goals.  Neurological exam is very poor this morning, and prognosis was discussed with patient's husband at the bedside.  Anticipate family meeting soon and possible transition to comfort care.   OBJECTIVE  CBC    Component Value Date/Time   WBC 27.0 (H) 01/07/2023 0245   RBC 4.87 01/07/2023 0245   HGB 13.0 01/07/2023 0245   HCT 40.8 01/07/2023 0245   PLT 282 01/07/2023 0245   MCV 83.8 01/07/2023 0245   MCH 26.7 01/07/2023 0245   MCHC 31.9 01/07/2023 0245   RDW 15.9 (H) 01/07/2023 0245   LYMPHSABS 0.7 01/01/2023 1857   MONOABS 0.9 01/12/2023 1857   EOSABS 0.0 01/07/2023 1857   BASOSABS 0.0 01/19/2023 1857    BMET    Component Value Date/Time   NA 148 (H) 01/07/2023 0848   NA 146 (H) 05/09/2020 0909   K 3.0 (L) 01/07/2023 0245   CL 110 01/07/2023 0245   CO2 19 (L) 01/07/2023 0245   GLUCOSE 192 (H) 01/07/2023 0245   BUN 14 01/07/2023 0245   BUN 18 05/09/2020 0909   CREATININE 1.22 (H) 01/07/2023 0245   CREATININE 0.98 02/02/2020 0848   CALCIUM 8.9  01/07/2023 0245   EGFR 49 (L) 05/09/2020 0909   GFRNONAA 48 (L) 01/07/2023 0245   GFRNONAA 60 02/02/2020 0848    IMAGING past 24 hours CT HEAD WO CONTRAST ( )  Result Date: 01/14/2023 CLINICAL DATA:  Hemorrhagic stroke EXAM: CT HEAD WITHOUT CONTRAST TECHNIQUE: Contiguous axial images were obtained from the base of the skull through the vertex without intravenous contrast. RADIATION DOSE REDUCTION: This exam was performed according to the departmental dose-optimization program which includes automated exposure control, adjustment of the mA and/or kV according to patient size and/or use of iterative reconstruction technique. COMPARISON:  01/02/2023 CT head FINDINGS: Brain: Redemonstrated left basal ganglia hemorrhage, which now measures up to 6.4 x 4.1 x 4.1 cm (AP x TR x CC), previously 6.0 x 4.0 x 4.4 cm when remeasured similarly, likely unchanged when accounting for differences in scan plane. Surrounding edema appears slightly increased, with 1.4 cm of left-to-right midline shift, previously 1.1 cm. Mass effect, with effacement the bilateral sulci. Increased hemorrhage in the lateral and third ventricles. Redemonstrated enlargement of the right temporal horn, concerning for entrapment. Redemonstrated mass effect on the midbrain, with narrowing of the mamillopontine distance. Possible narrowing of the basilar cisterns bilaterally. No hemorrhage is seen in the fourth ventricle. The brainstem and cerebellum appear to be in unchanged position, with no affect of mass effect on anterior pons. Vascular: No hyperdense vessel. Skull:  Negative for fracture or focal lesion. Sinuses/Orbits: Minimal mucosal thickening in the ethmoid air cells. Status post left lens replacement. Other: None. IMPRESSION: 1. Redemonstrated left basal ganglia hemorrhage, which appears overall unchanged in size when accounting for differences in scan plane. Surrounding edema appears slightly increased, with 1.4 cm of left-to-right  midline shift, previously 1.1 cm. 2. Increased hemorrhage in the lateral and third ventricles. Redemonstrated enlargement of the right temporal horn, concerning for entrapment. 3. Redemonstrated mass effect on the midbrain, with narrowing of the mamillopontine distance. Possible narrowing of the basilar cisterns bilaterally but no definite downward herniation. Electronically Signed   By: Wiliam Ke M.D.   On: 12/28/2022 20:21   [place] EKG SITE RITE  Result Date: 01/18/2023 If Site Rite image not attached, placement could not be confirmed due to current cardiac rhythm.  DG Chest Portable 1 View  Result Date: 01/01/2023 CLINICAL DATA:  Stroke EXAM: PORTABLE CHEST 1 VIEW COMPARISON:  None Available. FINDINGS: Endotracheal tube terminates 2.1 cm above the carina. Enteric tube extends into the stomach. Heart size is mildly enlarged. Central pulmonary vascular congestion. No focal airspace consolidation, pleural effusion, or pneumothorax. IMPRESSION: 1. Endotracheal tube terminates 2.1 cm above the carina. 2. Central pulmonary vascular congestion. Electronically Signed   By: Duanne Guess D.O.   On: 01/12/2023 15:17   CT ANGIO HEAD NECK W WO CM (CODE STROKE)  Result Date: 12/23/2022 CLINICAL DATA:  Left basal ganglia hemorrhage. EXAM: CT ANGIOGRAPHY HEAD AND NECK WITH AND WITHOUT CONTRAST TECHNIQUE: Multidetector CT imaging of the head and neck was performed using the standard protocol during bolus administration of intravenous contrast. Multiplanar CT image reconstructions and MIPs were obtained to evaluate the vascular anatomy. Carotid stenosis measurements (when applicable) are obtained utilizing NASCET criteria, using the distal internal carotid diameter as the denominator. RADIATION DOSE REDUCTION: This exam was performed according to the departmental dose-optimization program which includes automated exposure control, adjustment of the mA and/or kV according to patient size and/or use of iterative  reconstruction technique. CONTRAST:  50mL OMNIPAQUE IOHEXOL 350 MG/ML SOLN COMPARISON:  CT head without contrast 12/29/2022. MRA neck without and with contrast 12/03/2026 FINDINGS: CTA NECK FINDINGS Aortic arch: A 3 vessel arch configuration is present. No significant vascular disease present. No stenosis or aneurysm is present. Right carotid system: The right common carotid artery is within normal limits. The bifurcation is unremarkable. The cervical right ICA is normal. Left carotid system: The left common carotid artery is within normal limits. Bifurcation is unremarkable. Cervical left ICA is normal. Vertebral arteries: The vertebral arteries are codominant. Both vertebral arteries originate from the subclavian arteries without significant stenosis. No significant stenosis is present in either vertebral artery in the neck. Skeleton: Mild degenerative changes are present throughout the cervical spine. No focal osseous lesions are present. The patient is edentulous. Other neck: Endotracheal tube and OG tube are noted. Soft tissues the neck are otherwise unremarkable. Salivary glands are within normal limits. Thyroid is normal. No significant adenopathy is present. No focal mucosal or submucosal lesions are present. Endotracheal tube terminates just above the carina and is directed towards the right mainstem bronchus. OG tube courses off the inferior border of the film. Upper chest: Patchy airspace opacities are present in the right upper lobe. Left lung is clear. No significant pleural effusion or pneumothorax is present. Review of the MIP images confirms the above findings CTA HEAD FINDINGS Anterior circulation: Minimal atherosclerotic changes are present within the cavernous segments bilaterally. No significant stenosis is present  through the ICA termini. The A1 and M1 segments are normal. The left MCA branch vessels and bilateral ACA vessels are displaced by the hemorrhage. No focal stenosis or vessel occlusion  is present. No aneurysm is present. Posterior circulation: The left vertebral artery is dominant. PICA origins are visualized and within normal limits. Vertebrobasilar junction basilar artery normal. The superior cerebellar arteries are patent bilaterally. Moderate stenosis is present in the proximal P1 segment. Mild narrowing is present in the P2 segments bilaterally. Venous sinuses: The dural sinuses are patent. The straight sinus and deep cerebral veins are intact. Cortical veins are within normal limits. No significant vascular malformation is evident. Anatomic variants: None Review of the MIP images confirms the above findings IMPRESSION: 1. No significant vascular malformation. 2. No hemodynamically significant stenosis or large vessel occlusion in the neck. 3. Moderate stenosis in the proximal P1 segment of the left PCA. 4. Mild narrowing in the P2 segments bilaterally. 5. Patchy airspace opacities in the right upper lobe compatible with pneumonia. 6. Endotracheal tube terminates just above the carina and is directed towards the right mainstem bronchus. OG tube courses off the inferior border of the film. Electronically Signed   By: Marin Roberts M.D.   On: 01/11/2023 14:53   CT HEAD CODE STROKE WO CONTRAST  Result Date: 01/07/2023 CLINICAL DATA:  Code stroke. Neuro deficit, acute, stroke suspected. EXAM: CT HEAD WITHOUT CONTRAST TECHNIQUE: Contiguous axial images were obtained from the base of the skull through the vertex without intravenous contrast. RADIATION DOSE REDUCTION: This exam was performed according to the departmental dose-optimization program which includes automated exposure control, adjustment of the mA and/or kV according to patient size and/or use of iterative reconstruction technique. COMPARISON:  CT head without contrast 04/19/2017 FINDINGS: Brain: Acute left basal ganglia hemorrhage measures 4.8 x 5.4 x 4.7 cm. The estimated volume is 63.7 mL. Marked mass effect is present  with effacement of the sulci bilaterally, left greater than right. In compression of left-sided white matter. Midline shift measures up to 11 mm. Blood is present in the lateral ventricles bilaterally. Layering blood is noted posteriorly, left greater than right. Hemorrhage is present in the third ventricle. Right temporal tip is somewhat enlarged. Mass effect is present in the mid brain at the level of the tentorium. Brainstem and cerebellum are otherwise unremarkable. No blood is present in the fourth ventricle. No downward herniation is present. Small linear foci fat are noted along the interhemispheric falx, consistent with small lipomas, stable Vascular: Minimal vascular calcifications are present within the cavernous internal carotid arteries. No hyperdense vessel is present. Skull: Calvarium is intact. No focal lytic or blastic lesions are present. No significant extracranial soft tissue lesion is present. Sinuses/Orbits: The paranasal sinuses and mastoid air cells are clear. The globes and orbits are within normal limits. IMPRESSION: 1. Acute left basal ganglia hemorrhage measures 4.8 x 5.4 x 4.7 cm with an estimated volume of 63.7 mL. 2. Marked mass effect with effacement of the sulci bilaterally, left greater than right. 3. Midline shift measures up to 11 mm. 4. Intraventricular hemorrhage. 5. Mass effect in the mid brain at the level of the tentorium. 6. Small linear foci fat along the interhemispheric falx, consistent with small lipomas, stable. The above was relayed via text pager to Dr. Caryl Pina on 01/03/2023 at 14:40 . Electronically Signed   By: Marin Roberts M.D.   On: 01/15/2023 14:41    Vitals:   01/07/23 1245 01/07/23 1300 01/07/23 1315 01/07/23 1330  BP: (!) 141/61 (!) 141/63 (!) 150/64 (!) 141/65  Pulse: 67 68 71 70  Resp: (!) 24 (!) 24 (!) 24 (!) 24  Temp: (!) 97.2 F (36.2 C) (!) 97.2 F (36.2 C) (!) 97.2 F (36.2 C) (!) 97.3 F (36.3 C)  TempSrc:      SpO2: 100% 100%  100% 100%  Weight:      Height:         PHYSICAL EXAM General: Intubated elderly patient in no acute distress CV: Regular rate and rhythm on monitor Respiratory: Respirations synchronous with ventilator GI: Abdomen soft and nontender   NEURO (on no sedation): Pupils 3 mm and nonreactive, no corneal reflex, cough reflex present, does not move upper extremities to noxious stimuli but will flicker right lower extremity and slightly withdraw left lower extremity to proximal pinch  ASSESSMENT/PLAN  Intracerebral Hemorrhage: Large left basal ganglia ICH Etiology: Hypertensive Code Stroke CT head acute left basal ganglia hemorrhage with marked mass effect, midline shift of 1.1 cm, IVH and mass effect in the midbrain at the level of the tentorium CTA head & neck no significant vascular malformation, no hemodynamically significant stenosis or LVO, moderate stenosis in left P1 PCA, mild narrowing of P2 segments Repeat CT 11/17: Redemonstrated left basal ganglia hemorrhage with increased IVH in the lateral and third ventricles, redemonstrated enlargement of right temporal horn, concerning for entrapment and mass effect upon the midbrain with narrowing of the basilar cisterns but no definite downward herniation 2D Echo pending LDL UNABLE TO CALCULATE IF TRIGLYCERIDE OVER 400 mg/dL [id number] 6.0 VTE prophylaxis -SCDs No antithrombotic prior to admission, now on No antithrombotic due to ICH Therapy recommendations:  Pending Disposition: Pending, goals of care conversations ongoing  Hypertension Home meds: Losartan 50 mg daily, spironolactone 25 mg daily Unstable, requiring Cleviprex Blood Pressure Goal: SBP between 130-150 for 24 hours and then less than 160   Hyperlipidemia Home meds: None LDL UNABLE TO CALCULATE IF TRIGLYCERIDE OVER 400 mg/dL, goal < 70 Add statin if LDL greater than 70 Continue statin at discharge  Respiratory failure Patient intubated in the emergency  department Ventilator management per CCM  Dysphagia Patient has post-stroke dysphagia, SLP consulted    Diet   Diet NPO time specified   Advance diet as tolerated  Other Stroke Risk Factors Obesity, Body mass index is 30.25 kg/m., BMI >/= 30 associated with increased stroke risk, recommend weight loss, diet and exercise as appropriate    Other Active Problems None  Hospital day # 1  Patient seen by NP and then by MD, MD to edit note as needed. Cortney E Ernestina Columbia , MSN, AGACNP-BC [facility] See Amion for schedule and pager information 01/07/2023 1:56 PM   I have personally obtained history,examined this patient, reviewed notes, independently viewed imaging studies, participated in medical decision making and plan of care.ROS completed by me personally and pertinent positives fully documented  I have made any additions or clarifications directly to the above note. Agree with note above.  Patient neurological exam remains quite poor with fixed unreactive pupils and corneal reflex and only trace cough and gag and motor movement to noxious stimuli.  CT scan last night showed increasing size of the hemorrhage and cytotoxic edema with 1.4 cm left to right midline shift and downward shift.  Patient likely to progress to brain death in the next day or 2.  Chances of survival making any significant meaningful improvement is negligible.  I had a long discussion with patient's husband  at the bedside during a.m. rounds and then returned in the afternoon and spoke to her son and 2 daughters about the extremely grim prognosis.  Family agrees to DNR but need more time to make decision about withdrawal of care and would like other family members from out of town to come.  Continue ventilatory support for now but will not escalate care.  Discussed with Dr. Solon [place] critical care medicine. This patient is critically ill and at significant risk of neurological worsening, death and care requires  constant monitoring of vital signs, hemodynamics,respiratory and cardiac monitoring, extensive review of multiple databases, frequent neurological assessment, discussion with family, other specialists and medical decision making of high complexity.I have made any additions or clarifications directly to the above note.This critical care time does not reflect procedure time, or teaching time or supervisory time of PA/NP/Med Resident etc but could involve care discussion time.  I spent 50 minutes of neurocritical care time  in the care of  this patient.     Delia Heady, MD Medical Director [facility] Pager: [phone] 01/07/2023 5:41 PM   To contact Stroke Continuity provider, please refer to [email or web address]. After hours, contact General Neurology

## 2023-01-08 ENCOUNTER — Inpatient Hospital Stay (HOSPITAL_COMMUNITY): Payer: Medicare Other

## 2023-01-08 DIAGNOSIS — I619 Nontraumatic intracerebral hemorrhage, unspecified: Secondary | ICD-10-CM | POA: Diagnosis not present

## 2023-01-08 DIAGNOSIS — Z515 Encounter for palliative care: Secondary | ICD-10-CM

## 2023-01-08 DIAGNOSIS — G935 Compression of brain: Secondary | ICD-10-CM

## 2023-01-08 DIAGNOSIS — I469 Cardiac arrest, cause unspecified: Secondary | ICD-10-CM | POA: Diagnosis not present

## 2023-01-08 DIAGNOSIS — G936 Cerebral edema: Secondary | ICD-10-CM

## 2023-01-08 DIAGNOSIS — I472 Ventricular tachycardia, unspecified: Secondary | ICD-10-CM

## 2023-01-08 LAB — BASIC METABOLIC PANEL
Anion gap: 8 (ref 5–15)
BUN: 15 mg/dL (ref 8–23)
CO2: 20 mmol/L — ABNORMAL LOW (ref 22–32)
Calcium: 8.7 mg/dL — ABNORMAL LOW (ref 8.9–10.3)
Chloride: 129 mmol/L — ABNORMAL HIGH (ref 98–111)
Creatinine, Ser: 1.45 mg/dL — ABNORMAL HIGH (ref 0.44–1.00)
GFR, Estimated: 39 mL/min — ABNORMAL LOW (ref >60)
Glucose, Bld: 151 mg/dL — ABNORMAL HIGH (ref 70–99)
Potassium: 4 mmol/L (ref 3.5–5.1)
Sodium: 157 mmol/L — ABNORMAL HIGH (ref 135–145)

## 2023-01-08 LAB — SODIUM: Sodium: 158 mmol/L — ABNORMAL HIGH (ref 135–145)

## 2023-01-08 LAB — PHOSPHORUS: Phosphorus: 4.1 mg/dL (ref 2.5–4.6)

## 2023-01-08 LAB — MAGNESIUM: Magnesium: 2.5 mg/dL — ABNORMAL HIGH (ref 1.7–2.4)

## 2023-01-08 MED ORDER — ACETAMINOPHEN 325 MG PO TABS: 650.0000 mg | ORAL_TABLET | Freq: Four times a day (QID) | ORAL | Status: DC | PRN

## 2023-01-08 MED ORDER — HALOPERIDOL LACTATE 5 MG/ML IJ SOLN: 2.5000 mg | INTRAMUSCULAR | Status: DC | PRN

## 2023-01-08 MED ORDER — GLYCOPYRROLATE 1 MG PO TABS: 1.0000 mg | ORAL_TABLET | ORAL | Status: DC | PRN

## 2023-01-08 MED ORDER — MIDAZOLAM HCL 2 MG/2ML IJ SOLN
2.0000 mg | INTRAMUSCULAR | Status: DC | PRN
  Filled 2023-01-08: qty 4

## 2023-01-08 MED ORDER — PHENYLEPHRINE HCL-NACL 20-0.9 MG/250ML-% IV SOLN
0.0000 ug/min | INTRAVENOUS | Status: DC
Start: 2023-01-08 — End: 2023-01-08
  Administered 2023-01-08: 40 ug/min via INTRAVENOUS

## 2023-01-08 MED ORDER — PHENYLEPHRINE HCL-NACL 20-0.9 MG/250ML-% IV SOLN: 0.0000 ug/min | INTRAVENOUS | Status: DC

## 2023-01-08 MED ORDER — GLYCOPYRROLATE 0.2 MG/ML IJ SOLN: 0.2000 mg | INTRAMUSCULAR | Status: DC | PRN

## 2023-01-08 MED ORDER — SODIUM CHLORIDE 0.9 % IV SOLN: 250.0000 mL | INTRAVENOUS | Status: DC

## 2023-01-08 MED ORDER — ACETAMINOPHEN 650 MG RE SUPP: 650.0000 mg | Freq: Four times a day (QID) | RECTAL | Status: DC | PRN

## 2023-01-08 MED ORDER — POLYVINYL ALCOHOL 1.4 % OP SOLN: 1.0000 [drp] | Freq: Four times a day (QID) | OPHTHALMIC | Status: DC | PRN

## 2023-01-08 MED ORDER — SODIUM CHLORIDE 3% (HYPERTONIC SALINE) BOLUS VIA INFUSION
100.0000 mL | Freq: Once | INTRAVENOUS | Status: DC
  Administered 2023-01-08: 100 mL via INTRAVENOUS

## 2023-01-08 MED ORDER — MORPHINE SULFATE (PF) 2 MG/ML IV SOLN
2.0000 mg | INTRAVENOUS | Status: DC | PRN
  Administered 2023-01-08: 2 mg via INTRAVENOUS
  Filled 2023-01-08: qty 2

## 2023-01-08 MED ORDER — PHENYLEPHRINE HCL-NACL 20-0.9 MG/250ML-% IV SOLN
INTRAVENOUS | Status: AC
  Filled 2023-01-08: qty 250

## 2023-01-20 NOTE — Progress Notes (Signed)
eLink Physician-Brief Progress Note Patient Name: Meghan Nichols DOB: [birth year] MRN: [id number]   Date of Service  12/27/2022  HPI/Events of Note  72 y.o. female with history of hypertension and hyperlipidemia presenting after being found unresponsive in her vehicle.   Notified about sudden onset change in blood pressure, change in heart rate, with stable neurological exam-GCS 3.  No ventilatory dysfunction.  Previously on antihypertensives but now requiring phenylephrine infusion.  A.m. CT this morning revealing worsening ICH with surrounding cerebral edema.  eICU Interventions  Clinical picture is concerning for tentorial herniation and subsequent neurogenic shock.  Will initiate phenylephrine infusion.  Initiate osmolarity therapy.  Elevate head of bed.  Once hemodynamically stabilized, will assess with CT head.  Ongoing pending goals of care discussions.     Intervention Category Major Interventions: Change in mental status - evaluation and management  Lou Loewe 12/21/2022, 6:58 AM

## 2023-01-20 NOTE — Progress Notes (Signed)
Time of death 0916 verified by Sabino Gasser and Butch Penny. Stroke MD and CCM MD notified

## 2023-01-20 NOTE — Progress Notes (Signed)
NAME:  Meghan Nichols, MRN:  [id number], DOB:  1950-03-04, LOS: 2 ADMISSION DATE:  12/21/2022, CONSULTATION DATE:  01/14/2023 REFERRING MD: Otelia Limes, CHIEF COMPLAINT: ICH  History of Present Illness:  72 year old woman with found unresponsive in her vehicle.  Brought to ED still unresponsive of blood pressures in the 200s.  CT scan revealed large left cerebral hemispheric intracranial hemorrhage with brain compression.   Pertinent  Medical History   Past Medical History:  Diagnosis Date   High cholesterol    Hypertension    Past Surgical History:  Procedure Laterality Date   ABDOMINAL HYSTERECTOMY     No current facility-administered medications on file prior to encounter.   Current Outpatient Medications on File Prior to Encounter  Medication Sig Dispense Refill   losartan (COZAAR) 50 MG tablet Take 1 tablet (50 mg total) by mouth daily. 90 tablet 3   pravastatin (PRAVACHOL) 40 MG tablet TAKE 1 TABLET BY MOUTH EVERY DAY 90 tablet 3   Ascorbic Acid (VITAMIN C) 100 MG tablet Take 100 mg by mouth daily.     azelastine (ASTELIN) 0.1 % nasal spray Place 2 sprays into both nostrils 2 (two) times daily. Use in each nostril as directed 30 mL 3   brimonidine (ALPHAGAN P) 0.1 % SOLN Apply 1 drop to eye 3 (three) times daily.     cholecalciferol (VITAMIN D) 1000 UNITS tablet Take 1,000 Units by mouth daily.     fexofenadine (ALLEGRA) 180 MG tablet TAKE 1 TABLET BY MOUTH EVERY DAY 90 tablet 3   KLOR-CON M20 20 MEQ tablet TAKE 1 TABLET BY MOUTH TWICE A DAY 180 tablet 2   metoprolol succinate (TOPROL-XL) 100 MG 24 hr tablet TAKE 1 TABLET EVERY DAY WITH A MEAL 90 tablet 2   spironolactone (ALDACTONE) 25 MG tablet TAKE 1 TABLET (25 MG TOTAL) BY MOUTH DAILY. 90 tablet 3   traMADol (ULTRAM) 50 MG tablet Take 1 tablet (50 mg total) by mouth daily as needed for moderate pain. 30 tablet 2   VITAMIN E PO Take 1 tablet by mouth daily.     Unable to complete review of systems due to mental status.   Chart review suggest that the patient was previously relatively healthy with visits largely for orthopedic problems and hypertension.  Significant Hospital Events: Including procedures, antibiotic start and stop dates in addition to other pertinent events   11/17-admitted to neuro ICU.  CT scan shows large ICH left basal ganglia with extension into the lateral ventricles with compression of the midbrain. PICC placed, HTS. Worse imaging on repeat scan, made DNR  11/18 trying to obtain POA paperwork. HTS bolus, incr rate  11/19 CT H worse bleed, brainstem herniation, hydrocephalus, worse bleeding in multiple areas. Decision for comfort care   Interim History / Subjective:  Imaging overnight with worse   On pressors now w hemodynamic instability    After numerous failed attempts at reaching husband via both listed numbers, he did present to bedside. Discovered that he has possibly lost his phone, has the pts phone instead but also states that it isnt working. Is very frustrated that he "is not being regarded as husband" and frustrated that "he is not notified first" We talked extensively about the numerous attempts at contacting him both yesterday and today, and the inability to connect via phone. He would like it to be noted that he thinks his phone may be in [place] Bell's car and asked that I call her "at this number" to explain "the situation  at hand with facts from his wife to get my phone" and handed me a receipt with her name written on it but no contact number. I explained that with this receipt I am unable to contact her, and would not speak to [facility] regarding his wife's clinical status regardless.     Objective   Blood pressure 131/81, pulse (!) 105, temperature 98.6 F (37 C), resp. rate (!) 24, height 5\' 6"  (1.676 m), weight 85 kg, SpO2 100%.    Vent Mode: PRVC FiO2 (%):  [40 %] 40 % Set Rate:  [24 bmp] 24 bmp Vt Set:  [480 mL] 480 mL PEEP:  [5 cmH20] 5 cmH20 Plateau Pressure:  [16  cmH20-19 cmH20] 18 cmH20   Intake/Output Summary (Last 24 hours) at 01/16/2023 0850 Last data filed at 01/19/2023 0800 Gross per 24 hour  Intake 2718.52 ml  Output 2495 ml  Net 223.52 ml   Filed Weights   12/26/2022 1422  Weight: 85 kg    Examination: General: Critically ill elderly F NAD  Neuro: Off sedation. Fixed pupils -- L pupil 4mm R pupil 3mm No spontaneous respiraitons, no cough/gag, no corneal reflex.  Noj response to pain  HENT: NCAT pink mm ETT secure  Lungs: Mechanically ventilated, upper lobe scattered rhonchi Cardiovascular:  tachycardic no rgm Abdomen: soft round ndnt  Extremities:  no acute joint deformity, no cyanosis or clubbing. SCDs  GU: foley w clear yellow urine   Ancillary test personally reviewed:  CT H 11/17 ICH, IVH, cerebral edema and MLS   Repeat CT H 11/17 night with worsening ICH and IVH, worsening midline shift  Assessment & Plan:   GOC discussion Encounter for palliative care  DNR status  L basal ganglia ICH with intraventricular extension Brain compression, midline shift, cerebral edema, hydrocephalus, herniation  Neurogenic shock  Iatrogenic hypernatremia  Acute respiratory failure VT arrest  HTN emergency HTN HLD AKI NAGMA Lactic acidosis Elevated LFTs   Leukocytosis - favor reactive  Fever - favor central  Hyperglycemia  Prediabetes  -ICH score 4 = 97% mortality -11/19 CT H worse, loss of brainstem reflexes  P -after several extensive talks with numerous family members, including separated spouse, children, grandchildren-- decision reached to transition to comfort care in lieu of pursuing brain death testing -family is gathering to say goodbye this morning, and understand that she may have cardiac death in interim.  -DNR -dc amio, HTS, meds not aimed at comfort, further labs and imaging -dc pressors after extubation.  -PRN morphine and PRN versed are available as needed  -expect in-hospital death -d/w primary team who is in  agreement with plan of care   Best Practice (right click and "Reselect all SmartList Selections" daily)   Diet/type: npo DVT prophylaxis: not indicated GI prophylaxis: N/A Lines: Central line Foley:  Yes, and it is still needed Code Status:  DNR Last date of multidisciplinary goals of care discussion 11/19    CRITICAL CARE Performed by: Lanier Clam   Total critical care time:   Critical care time was exclusive of separately billable procedures and treating other patients. Critical care was necessary to treat or prevent imminent or life-threatening deterioration.  Critical care was time spent personally by me on the following activities: development of treatment plan with patient and/or surrogate as well as nursing, discussions with consultants, evaluation of patient's response to treatment, examination of patient, obtaining history from patient or surrogate, ordering and performing treatments and interventions, ordering and review of laboratory studies, ordering  and review of radiographic studies, pulse oximetry and re-evaluation of patient's condition.  Tessie Fass MSN, AGACNP-BC [facility] Pulmonary/Critical Care Medicine Amion for pager  12/22/2022, 8:50 AM

## 2023-01-20 NOTE — Death Summary Note (Cosign Needed)
Patient ID: Meghan Nichols MRN: [id number] DOB/AGE: 70-10-1950 70 y.o.  Admit date: 01-13-23 Death date: 2023/01/15  Admission Diagnoses:Intracerebral Hemorrhage: Large left basal ganglia ICH Etiology: Hypertensive  Cause of Death: Left basal ganglia ICH  Pertinent Medical Diagnosis: Principal Problem:   ICH (intracerebral hemorrhage) (HCC) Active Problems:   Brain compression (HCC)   Acute respiratory failure (HCC)   V-tach (HCC)   Goals of care, counseling/discussion   DNR (do not resuscitate)   Cardiac arrest (HCC)   Hemorrhagic stroke (HCC)   Cerebral edema (HCC)   Brain herniation (HCC)   Ventricular tachycardia (HCC)   Encounter for palliative care   Hospital Course: 72 year old patient with history of hypertension and hyperlipidemia was found unresponsive in her vehicle on Jan 13, 2023.  She was brought to the ED and found to be quite hypertensive and required CPR for an abnormal rhythm of V. tach.  She was resuscitated and intubated.  CT head was then performed revealing large left cerebral hemisphere ICH with IVH.  ICH score was 4.  Neurosurgery was consulted and determined that surgical interventions would not be helpful for this patient.  Her neurological exam remained poor, and goals of care conversations were held with family.  Decision was made for compassionate extubation and transition to comfort care.  Patient was extubated in the morning of 01/15/23 and died at 0916.  Signed: Marjorie Smolder January 15, 2023, 2:51 PM

## 2023-01-20 NOTE — Progress Notes (Signed)
   01/03/2023 0915  Spiritual Encounters  Type of Visit Follow up  Conversation partners present during encounter Nurse  Reason for visit Patient death  OnCall Visit No   Scheduled follow-up visit, patient expired. Time of death [id number]

## 2023-01-20 NOTE — Progress Notes (Signed)
Nutrition Brief Note  Chart reviewed. Pt now transitioning to comfort care.  No further nutrition interventions planned at this time.  Please re-consult as needed.   Levada Schilling, RD, LDN, CDCES Registered Dietitian III Certified Diabetes Care and Education Specialist Please refer to [facility] for RD and/or RD on-call/weekend/after hours pager

## 2023-01-20 NOTE — Procedures (Signed)
Extubation Procedure Note  Patient Details:   Name: Meghan Nichols DOB: Jun 25, 1950 MRN: [id number]   Airway Documentation:    Vent end date: 01/16/2023 Vent end time: 0850 (comfort care)   Evaluation  O2 sats: currently acceptable Complications: No apparent complications Patient did tolerate procedure well. Bilateral Breath Sounds: Diminished   No  Pt extubated to comfort care measures per physician order and family request. Pt suctioned via ETT/orally prior. Family remains at patient bedside. No further needs at this time. RT will continue to be available as needed.   Derinda Late 01/04/2023, 8:53 AM

## 2023-01-20 NOTE — Progress Notes (Signed)
Pt is herniating, on exam I cannot illicit brainstem reflexes-- has been off of sedation for about 24hrs   Tried to call number for spouse -- no answer on " home" number, and a gentleman answered listed "mobile" number stating "you have the wrong number."  Tried to call Son, no answer Called granddaughter who answered, understands the severity of this change and will becoming to hospital/trying to gather others.    Tessie Fass MSN, AGACNP-BC [facility] Pulmonary/Critical Care Medicine 12/27/2022, 7:38 AM

## 2023-01-20 DEATH — deceased

## 2023-02-04 ENCOUNTER — Other Ambulatory Visit: Payer: Self-pay | Admitting: Family Medicine

## 2023-02-04 DIAGNOSIS — I10 Essential (primary) hypertension: Secondary | ICD-10-CM

## 2023-02-23 ENCOUNTER — Other Ambulatory Visit: Payer: Self-pay | Admitting: Family Medicine

## 2023-02-23 DIAGNOSIS — J309 Allergic rhinitis, unspecified: Secondary | ICD-10-CM

## 2023-04-29 ENCOUNTER — Encounter: Payer: Medicare Other | Admitting: Family Medicine
